# Patient Record
Sex: Male | Born: 1945
Health system: Southern US, Community
[De-identification: ages and names within clinical notes are randomized; demographics above are authoritative.]

## PROBLEM LIST (undated history)

## (undated) DIAGNOSIS — Z86010 Personal history of colonic polyps: Secondary | ICD-10-CM

## (undated) DIAGNOSIS — M545 Low back pain, unspecified: Secondary | ICD-10-CM

## (undated) DIAGNOSIS — H269 Unspecified cataract: Secondary | ICD-10-CM

## (undated) DIAGNOSIS — K219 Gastro-esophageal reflux disease without esophagitis: Secondary | ICD-10-CM

## (undated) DIAGNOSIS — I10 Essential (primary) hypertension: Secondary | ICD-10-CM

## (undated) DIAGNOSIS — R0602 Shortness of breath: Secondary | ICD-10-CM

## (undated) DIAGNOSIS — I739 Peripheral vascular disease, unspecified: Secondary | ICD-10-CM

## (undated) DIAGNOSIS — Z9081 Acquired absence of spleen: Secondary | ICD-10-CM

## (undated) DIAGNOSIS — N400 Enlarged prostate without lower urinary tract symptoms: Secondary | ICD-10-CM

## (undated) DIAGNOSIS — M47812 Spondylosis without myelopathy or radiculopathy, cervical region: Secondary | ICD-10-CM

## (undated) DIAGNOSIS — Z87891 Personal history of nicotine dependence: Secondary | ICD-10-CM

## (undated) DIAGNOSIS — K279 Peptic ulcer, site unspecified, unspecified as acute or chronic, without hemorrhage or perforation: Secondary | ICD-10-CM

## (undated) DIAGNOSIS — L309 Dermatitis, unspecified: Secondary | ICD-10-CM

## (undated) DIAGNOSIS — J45909 Unspecified asthma, uncomplicated: Secondary | ICD-10-CM

## (undated) DIAGNOSIS — J449 Chronic obstructive pulmonary disease, unspecified: Secondary | ICD-10-CM

## (undated) DIAGNOSIS — Z8619 Personal history of other infectious and parasitic diseases: Secondary | ICD-10-CM

## (undated) DIAGNOSIS — Z5189 Encounter for other specified aftercare: Secondary | ICD-10-CM

## (undated) HISTORY — DX: Essential (primary) hypertension: I10

## (undated) HISTORY — DX: Low back pain: M54.5

## (undated) HISTORY — DX: Unspecified asthma, uncomplicated: J45.909

## (undated) HISTORY — DX: Benign prostatic hyperplasia without lower urinary tract symptoms: N40.0

## (undated) HISTORY — DX: Unspecified cataract: H26.9

## (undated) HISTORY — DX: Acquired absence of spleen: Z90.81

## (undated) HISTORY — DX: Personal history of other infectious and parasitic diseases: Z86.19

## (undated) HISTORY — DX: Low back pain, unspecified: M54.50

## (undated) HISTORY — DX: Personal history of colonic polyps: Z86.010

## (undated) HISTORY — PX: KNEE ARTHROSCOPY: SHX127

## (undated) HISTORY — DX: Personal history of nicotine dependence: Z87.891

## (undated) HISTORY — DX: Chronic obstructive pulmonary disease, unspecified: J44.9

## (undated) HISTORY — DX: Peptic ulcer, site unspecified, unspecified as acute or chronic, without hemorrhage or perforation: K27.9

## (undated) HISTORY — DX: Peripheral vascular disease, unspecified: I73.9

## (undated) HISTORY — DX: Encounter for other specified aftercare: Z51.89

## (undated) HISTORY — DX: Spondylosis without myelopathy or radiculopathy, cervical region: M47.812

---

## 1996-07-04 HISTORY — PX: SPLENECTOMY, PARTIAL: SHX787

## 2007-07-05 HISTORY — PX: COLONOSCOPY: SHX174

## 2011-07-05 HISTORY — PX: OTHER SURGICAL HISTORY: SHX169

## 2012-02-24 LAB — LIPID PANEL: Triglycerides: 65

## 2012-02-24 LAB — COMPREHENSIVE METABOLIC PANEL
ALT: 12 U/L (ref 10–40)
AST: 13 U/L
Alkaline Phosphatase: 68 U/L
Creat: 1.07
Glucose: 99

## 2012-02-24 LAB — PSA: PSA: 1.15

## 2012-09-20 ENCOUNTER — Encounter: Payer: Self-pay | Admitting: Family Medicine

## 2012-09-20 ENCOUNTER — Ambulatory Visit (INDEPENDENT_AMBULATORY_CARE_PROVIDER_SITE_OTHER): Payer: Medicare Other | Admitting: Family Medicine

## 2012-09-20 VITALS — BP 170/96 | HR 77 | Temp 98.1°F | Ht 69.5 in | Wt 177.0 lb

## 2012-09-20 DIAGNOSIS — I10 Essential (primary) hypertension: Secondary | ICD-10-CM

## 2012-09-20 DIAGNOSIS — M545 Low back pain, unspecified: Secondary | ICD-10-CM | POA: Insufficient documentation

## 2012-09-20 MED ORDER — LOSARTAN POTASSIUM-HCTZ 50-12.5 MG PO TABS: 1.0000 | ORAL_TABLET | Freq: Every day | ORAL | Status: DC

## 2012-09-20 MED ORDER — ATENOLOL 50 MG PO TABS: 50.0000 mg | ORAL_TABLET | Freq: Every day | ORAL | Status: DC

## 2012-09-20 NOTE — Assessment & Plan Note (Signed)
Monitor for now.  Await records.

## 2012-09-20 NOTE — Assessment & Plan Note (Signed)
Chronic, uncontrolled 2/2 ran out of meds and difficulty establishing with new provider. Restart, slowly.  Start atenolol 50mg  daily, then start losartan hctz 50/12.5 rtc 1 mo for /fu I have requested records from prior PCP. Will likely be due for CPE 01/2013.

## 2012-09-20 NOTE — Patient Instructions (Addendum)
Fill the blood pressure medicines and take atenolol 50mg  today and tomorrow and then start atenolol 50mg  and losartan hctz 50/12.5 mg daily. Goal blood pressure <140/90 Let's keep track of bp at home and keep log 1 week prior to next appointment. Return in 1 month for follow up blood pressure. May get blood work then.

## 2012-09-20 NOTE — Progress Notes (Signed)
Subjective:    Patient ID: Gregory Abbott, male    DOB: Oct 23, 1945, 67 y.o.   MRN: [id number]  HPI CC: new pt to establish  Recently moved from [place].  Retired 04/04/2012.  Moved to be closer to wife.  Family in [place].  HTN - Has been out of BP meds since 07/2012.  Has been stretching meds out.  Prior when on meds, good control.  Was on atenolol 100mg  daily as well as losartan hctz 50/12.5 mg daily.  No HA, vision changes, CP/tightness, SOB, leg swelling.  Lower back pain - h/o bulging disc and herniated disc in lower back.  Smoker - 1/2 ppd.  Working on cutting back with blue cig (e-cig).  Preventative: No recent CPE. colonosocpy 2010  Medications and allergies reviewed and updated in chart.  Past histories reviewed and updated if relevant as below. Patient Active Problem List  Diagnosis  . GERD (gastroesophageal reflux disease)  . Hypertension  . Lower back pain   Past Medical History  Diagnosis Date  . Childhood asthma   . History of chicken pox   . Hypertension   . Lower back pain     h/o herniated and bulging discs lower back  . Smoker    Past Surgical History  Procedure Laterality Date  . Splenectomy, partial  1998    fall down stairs   History  Substance Use Topics  . Smoking status: Current Every Day Smoker -- 0.50 packs/day    Types: Cigarettes  . Smokeless tobacco: Never Used     Comment: smoking e-cigaretts trying to quit  . Alcohol Use: Yes     Comment: occ   Family History  Problem Relation Age of Onset  . Diabetes Mother   . Hypertension Mother   . Stroke Father   . Hyperlipidemia Mother    No Known Allergies No current outpatient prescriptions on file prior to visit.   No current facility-administered medications on file prior to visit.     Review of Systems  Constitutional: Negative for fever, chills, activity change, appetite change, fatigue and unexpected weight change.  HENT: Negative for hearing loss and neck pain.   Eyes: Negative  for visual disturbance.  Respiratory: Negative for cough, chest tightness, shortness of breath and wheezing.   Cardiovascular: Negative for chest pain, palpitations and leg swelling.  Gastrointestinal: Negative for nausea, vomiting, abdominal pain, diarrhea, constipation, blood in stool and abdominal distention.  Genitourinary: Negative for hematuria and difficulty urinating.  Musculoskeletal: Positive for myalgias (leg and calf pain occasionally in am's (s/p normal ABIs 12/2011)). Negative for arthralgias.  Skin: Negative for rash.  Neurological: Negative for dizziness, seizures, syncope and headaches.  Hematological: Does not bruise/bleed easily.  Psychiatric/Behavioral: Negative for dysphoric mood. The patient is not nervous/anxious.        Objective:   Physical Exam  Nursing note and vitals reviewed. Constitutional: He is oriented to person, place, and time. He appears well-developed and well-nourished. No distress.  HENT:  Head: Normocephalic and atraumatic.  Right Ear: Hearing, tympanic membrane, external ear and ear canal normal.  Left Ear: Hearing, tympanic membrane, external ear and ear canal normal.  Nose: Nose normal.  Mouth/Throat: Oropharynx is clear and moist. No oropharyngeal exudate.  Eyes: Conjunctivae and EOM are normal. Pupils are equal, round, and reactive to light. No scleral icterus.  Neck: Normal range of motion. Neck supple. No thyromegaly present.  Cardiovascular: Normal rate, regular rhythm, normal heart sounds and intact distal pulses.   No murmur heard.  Pulses:      Radial pulses are 2+ on the right side, and 2+ on the left side.  Pulmonary/Chest: Effort normal and breath sounds normal. No respiratory distress. He has no wheezes. He has no rales.  Musculoskeletal: Normal range of motion. He exhibits no edema.  Lymphadenopathy:    He has no cervical adenopathy.  Neurological: He is alert and oriented to person, place, and time.  CN grossly intact, station  and gait intact  Skin: Skin is warm and dry. No rash noted.  Psychiatric: He has a normal mood and affect. His behavior is normal. Judgment and thought content normal.       Assessment & Plan:

## 2012-10-21 ENCOUNTER — Encounter: Payer: Self-pay | Admitting: Family Medicine

## 2012-10-22 ENCOUNTER — Ambulatory Visit (INDEPENDENT_AMBULATORY_CARE_PROVIDER_SITE_OTHER): Payer: Medicare Other | Admitting: Family Medicine

## 2012-10-22 ENCOUNTER — Encounter: Payer: Self-pay | Admitting: Family Medicine

## 2012-10-22 VITALS — BP 148/92 | Temp 98.1°F | Wt 176.5 lb

## 2012-10-22 DIAGNOSIS — F17200 Nicotine dependence, unspecified, uncomplicated: Secondary | ICD-10-CM

## 2012-10-22 DIAGNOSIS — Z87891 Personal history of nicotine dependence: Secondary | ICD-10-CM | POA: Insufficient documentation

## 2012-10-22 DIAGNOSIS — I10 Essential (primary) hypertension: Secondary | ICD-10-CM

## 2012-10-22 MED ORDER — LOSARTAN POTASSIUM-HCTZ 100-12.5 MG PO TABS: 1.0000 | ORAL_TABLET | Freq: Every day | ORAL | Status: DC

## 2012-10-22 NOTE — Assessment & Plan Note (Addendum)
Continue to encourage cessation. quitlinenc resource provided. precontemplative.

## 2012-10-22 NOTE — Assessment & Plan Note (Signed)
Improved control on these meds, however still not at goal .  Will increase losartan hct to 100/12.5mg  daily. rtc for AMW.

## 2012-10-22 NOTE — Patient Instructions (Addendum)
Good to see you today, call us with questions. Increase losartan hct to 100/12.5mg  one daily. Return in 3 months for follow up. Continue working on cutting back on smoking - best thing to do for your health. Ok to continue naprosyn. Given family history of stroke, start aspirin 81mg  every other day.

## 2012-10-22 NOTE — Progress Notes (Signed)
  Subjective:    Patient ID: Graydon Fofana, male    DOB: September 12, 1945, 67 y.o.   MRN: [id number]  HPI CC: f/u HTN  Mr Lincoln returns today for f/u blood pressure.  HTN - compliant with meds.  No HA, vision changes, CP/tightness, SOB, leg swelling.  Improved #s but some still poor control.  Brings bp log over last month - range 134-171/70-102. BP Readings from Last 3 Encounters:  10/22/12 148/92  09/20/12 170/96    Occasional right wrist discomfort worse with exertion.  Occasional swelling.  Has been using wife's pain medication - naprosyn.  Smoking - 1/2 ppd.  Also using blue cig  Past Medical History  Diagnosis Date  . Childhood asthma   . History of chicken pox   . Hypertension   . Lower back pain     h/o herniated and bulging discs lower back  . Smoker   . BPH (benign prostatic hypertrophy)   . Osteoarthritis of neck      Review of Systems Per HPI    Objective:   Physical Exam  Nursing note and vitals reviewed. Constitutional: He appears well-developed and well-nourished. No distress.  HENT:  Head: Normocephalic and atraumatic.  Mouth/Throat: Oropharynx is clear and moist. No oropharyngeal exudate.  Eyes: Conjunctivae and EOM are normal. Pupils are equal, round, and reactive to light. No scleral icterus.  Neck: Normal range of motion. Neck supple. Carotid bruit is not present. No thyromegaly present.  Cardiovascular: Normal rate, regular rhythm, normal heart sounds and intact distal pulses.   No murmur heard. Pulmonary/Chest: Breath sounds normal. No respiratory distress. He has no wheezes. He has no rales.  Musculoskeletal: He exhibits no edema.  Skin: Skin is warm and dry. No rash noted.       Assessment & Plan:

## 2012-10-26 ENCOUNTER — Encounter: Payer: Self-pay | Admitting: Family Medicine

## 2013-01-19 ENCOUNTER — Encounter (HOSPITAL_COMMUNITY): Payer: Self-pay | Admitting: *Deleted

## 2013-01-19 ENCOUNTER — Emergency Department (INDEPENDENT_AMBULATORY_CARE_PROVIDER_SITE_OTHER)
Admission: EM | Admit: 2013-01-19 | Discharge: 2013-01-19 | Disposition: A | Discharge status: Home / Self Care | Payer: Medicare Other | Source: Home / Self Care

## 2013-01-19 DIAGNOSIS — M25469 Effusion, unspecified knee: Secondary | ICD-10-CM

## 2013-01-19 DIAGNOSIS — M25462 Effusion, left knee: Secondary | ICD-10-CM

## 2013-01-19 DIAGNOSIS — M1990 Unspecified osteoarthritis, unspecified site: Secondary | ICD-10-CM | POA: Diagnosis not present

## 2013-01-19 NOTE — ED Notes (Signed)
Pt  Woke   Up  Several  Days  Ago       With  Swollen  l  Knee  Had  Some    Pain  Earlier  But  None  Now     He  denys  Any  specefic  Injury     No  History  Of  Any  Related knee  Problems

## 2013-01-19 NOTE — ED Provider Notes (Signed)
Medical screening examination/treatment/procedure(s) were performed by non-physician practitioner and as supervising physician I was immediately available for consultation/collaboration.  Leslee Home, M.D.  Reuben Likes, MD 01/19/13 [phone]

## 2013-01-19 NOTE — ED Provider Notes (Signed)
History    CSN: [id number] Arrival date & time 01/19/13  1355  First MD Initiated Contact with Patient 01/19/13 1412     Chief Complaint  Patient presents with  . Knee Pain   (Consider location/radiation/quality/duration/timing/severity/associated sxs/prior Treatment) HPI Comments: 67 year old male presents with swelling of the left knee for 2 days. The initial pain began in the shin and calf however that has since abated and he is now complaining of swelling and stiffness to the left knee. He denies any pain in the leg today he has limited flexion due to swelling. He does not have a history of gout and he denies trauma or working on his knees.  Past Medical History  Diagnosis Date  . Childhood asthma   . History of chicken pox   . Hypertension   . Lower back pain     h/o herniated and bulging discs lower back  . Smoker   . BPH (benign prostatic hypertrophy)   . Osteoarthritis of neck    Past Surgical History  Procedure Laterality Date  . Splenectomy, partial  1998    fall down stairs  . Colonoscopy  2009  . Lower extremity arterial doppler  2013    WNL   Family History  Problem Relation Age of Onset  . Diabetes Mother   . Hypertension Mother   . Stroke Father   . Hyperlipidemia Mother    History  Substance Use Topics  . Smoking status: Current Every Day Smoker -- 0.50 packs/day    Types: Cigarettes  . Smokeless tobacco: Never Used     Comment: smoking e-cigaretts trying to quit  . Alcohol Use: Yes     Comment: occ    Review of Systems  Constitutional: Negative.   Respiratory: Negative.   Gastrointestinal: Negative.   Genitourinary: Negative.   Musculoskeletal:       As per HPI  Skin: Negative.   Neurological: Negative for dizziness, weakness, numbness and headaches.    Allergies  Review of patient's allergies indicates no known allergies.  Home Medications   Current Outpatient Rx  Name  Route  Sig  Dispense  Refill  . aspirin EC 81 MG tablet  Oral   Take 81 mg by mouth every other day.         [name] atenolol (TENORMIN) 50 MG tablet   Oral   Take 1 tablet (50 mg total) by mouth daily.   90 tablet   3   . losartan-hydrochlorothiazide (HYZAAR) 100-12.5 MG per tablet   Oral   Take 1 tablet by mouth daily.   90 tablet   3    BP 140/67  Pulse 78  Temp(Src) 98 F (36.7 C) (Oral)  Resp 16  SpO2 99% Physical Exam  Nursing note and vitals reviewed. Constitutional: He is oriented to person, place, and time. He appears well-developed and well-nourished.  HENT:  Head: Normocephalic and atraumatic.  Eyes: EOM are normal. Left eye exhibits no discharge.  Neck: Normal range of motion. Neck supple.  Pulmonary/Chest: Effort normal.  Musculoskeletal: He exhibits edema. He exhibits no tenderness.  Left knee with apparent swelling/effusion. No tenderness. No bony tenderness or soft tissue tenderness. Extension to 180 complete. Flexion is limited to 100. No increase in warmth, no redness and no lymphangitis.  Neurological: He is alert and oriented to person, place, and time. No cranial nerve deficit.  Skin: Skin is warm and dry.  Psychiatric: He has a normal mood and affect.    ED Course  Procedures (including critical care time) Labs Reviewed - No data to display No results found. 1. Osteoarthritis   2. Knee effusion, left     MDM  I offered to tap the left knee but the patient declined. He state he would rather wear a supportive device, keep leg elevated apply ice. Complied with that request and applied a firmly wrapped in Ace bandage with maintaining distal circulation. He was advised to return if there was increased swelling, redness or worsening.   Hayden Rasmussen, NP 01/19/13 1524

## 2013-01-25 ENCOUNTER — Other Ambulatory Visit: Payer: Self-pay | Admitting: Family Medicine

## 2013-01-25 ENCOUNTER — Other Ambulatory Visit (INDEPENDENT_AMBULATORY_CARE_PROVIDER_SITE_OTHER): Payer: BC Managed Care – PPO

## 2013-01-25 DIAGNOSIS — Z125 Encounter for screening for malignant neoplasm of prostate: Secondary | ICD-10-CM

## 2013-01-25 DIAGNOSIS — I10 Essential (primary) hypertension: Secondary | ICD-10-CM

## 2013-01-25 LAB — BASIC METABOLIC PANEL
CO2: 25 mEq/L (ref 19–32)
Calcium: 9.7 mg/dL (ref 8.4–10.5)
Glucose, Bld: 99 mg/dL (ref 70–99)
Sodium: 139 mEq/L (ref 135–145)

## 2013-01-25 LAB — LIPID PANEL
HDL: 46.5 mg/dL (ref >39.00)
Total CHOL/HDL Ratio: 4

## 2013-02-01 ENCOUNTER — Ambulatory Visit (INDEPENDENT_AMBULATORY_CARE_PROVIDER_SITE_OTHER): Payer: BC Managed Care – PPO | Admitting: Family Medicine

## 2013-02-01 ENCOUNTER — Telehealth: Payer: Self-pay | Admitting: *Deleted

## 2013-02-01 ENCOUNTER — Ambulatory Visit: Payer: Medicare Other | Admitting: Family Medicine

## 2013-02-01 ENCOUNTER — Encounter: Payer: Self-pay | Admitting: Family Medicine

## 2013-02-01 VITALS — BP 170/94 | HR 88 | Temp 98.3°F | Ht 69.5 in | Wt 169.5 lb

## 2013-02-01 DIAGNOSIS — F17200 Nicotine dependence, unspecified, uncomplicated: Secondary | ICD-10-CM

## 2013-02-01 DIAGNOSIS — I10 Essential (primary) hypertension: Secondary | ICD-10-CM

## 2013-02-01 DIAGNOSIS — Z0000 Encounter for general adult medical examination without abnormal findings: Secondary | ICD-10-CM | POA: Insufficient documentation

## 2013-02-01 DIAGNOSIS — Z136 Encounter for screening for cardiovascular disorders: Secondary | ICD-10-CM

## 2013-02-01 DIAGNOSIS — J209 Acute bronchitis, unspecified: Secondary | ICD-10-CM | POA: Diagnosis not present

## 2013-02-01 DIAGNOSIS — Z1211 Encounter for screening for malignant neoplasm of colon: Secondary | ICD-10-CM

## 2013-02-01 MED ORDER — AZITHROMYCIN 250 MG PO TABS: ORAL_TABLET | ORAL | Status: DC

## 2013-02-01 MED ORDER — ATENOLOL 50 MG PO TABS: 50.0000 mg | ORAL_TABLET | Freq: Two times a day (BID) | ORAL | Status: DC

## 2013-02-01 NOTE — Assessment & Plan Note (Signed)
Continued to strongly encourage cessation.

## 2013-02-01 NOTE — Patient Instructions (Signed)
Continue losartan hctz 100/12.5mg  once daily for blood pressure. Start taking atenolol 50mg  twice daily for blood pressure.  Continue aspirin 81mg  every other day. Pass by Marion's office to schedule screening ultrasound for abdominal aneurysm (in [place]) EKG today. Schedule eye exam as you're due and you failed vision screen today. Let me know if you notice worsening trouble with hearing. Stool kit today. Advanced directive packet provided today.  We will continue to discuss. For bronchitis - take zpack antibiotic.  If not improved or any worsening, return to see me. Best thing for your health is to quit smoking Good to see you today, call us with questions.

## 2013-02-01 NOTE — Progress Notes (Signed)
Subjective:    Patient ID: Gregory Abbott, male    DOB: [birth year], 67 y.o.   MRN: [id number]  HPI CC: welcome to medicare visit.  HTN - some confusion about meds today, but endorses taking meds as prescribed.  bp at home ranging 130-170 systolic.  Did take meds today prior to appointment. Recently saw [facility] with knee OA with effusion.  This has improved. Fighting bout of bronchitis - wheezing at night.  No fevers.  Smoker.  Going on for last month.  Cough productive of sputum. Foot rash - comes and goes.  Itchy.  Blisters develop.  Has received what sounds like steroid shot to treat this in the past.  Smoker - 1/2 ppd.  Has smoked ~47 yrs.  Almost 30 PY hx.    Failed hearing and vision screens today.  Last eye exam 2012.  Denies trouble with hearing.  Declines audiology referral No falls, no depression.  Preventative:  colonoscopy in [place], but no records of actual colonoscopy, states all normal.  No fmhx colon cancer. Prostate cancer screening - discussed, would like screening today. Tdap - 2012 Pneumovax - pt states had done 1990s, due for rpt.  Deferred as currently with resp illness. Shingle shot - have not discussed. Advanced directives - discussed. packet provided.   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  Medications and allergies reviewed and updated in chart.  Past histories reviewed and updated if relevant as below. Patient Active Problem List   Diagnosis Date Noted  . Smoker 10/22/2012  . Hypertension   . Lower back pain    Past Medical History  Diagnosis Date  . Childhood asthma   . History of chicken pox   . Hypertension   .  Lower back pain     h/o herniated and bulging discs lower back  . Smoker   . BPH (benign prostatic hypertrophy)   . Osteoarthritis of neck    Past Surgical History  Procedure Laterality Date  . Splenectomy, partial  1998    fall down stairs  . Colonoscopy  2009  . Lower extremity arterial doppler  2013    WNL   History  Substance Use Topics  . Smoking status: Current Every Day Smoker -- 0.50 packs/day    Types: Cigarettes  . Smokeless tobacco: Never Used     Comment: smoking e-cigaretts trying to quit  . Alcohol Use: Yes     Comment: Social   Family History  Problem Relation Age of Onset  . Diabetes Mother   . Hypertension Mother   . Stroke Father   . Hyperlipidemia Mother    No Known Allergies Current Outpatient Prescriptions on File Prior to Visit  Medication Sig Dispense Refill  . aspirin EC 81 MG tablet Take 81 mg by mouth every other day.      [name] atenolol (TENORMIN) 50 MG tablet Take 1 tablet (50 mg total) by mouth daily.  90 tablet  3  . losartan-hydrochlorothiazide (HYZAAR) 100-12.5 MG per tablet Take 1 tablet by mouth daily.  90 tablet  3   No current facility-administered medications on file prior to visit.     Review  of Systems  Constitutional: Negative for fever, chills, activity change, appetite change, fatigue and unexpected weight change.  HENT: Negative for hearing loss and neck pain.   Eyes: Negative for visual disturbance.  Respiratory: Negative for cough, chest tightness, shortness of breath and wheezing.   Cardiovascular: Negative for chest pain, palpitations and leg swelling.  Gastrointestinal: Negative for nausea, vomiting, abdominal pain, diarrhea, constipation, blood in stool and abdominal distention.  Genitourinary: Negative for hematuria and difficulty urinating.  Musculoskeletal: Positive for myalgias (leg and calf pain occasionally in am's (s/p normal ABIs 12/2011)). Negative for arthralgias.  Skin: Negative for rash.  Neurological: Negative  for dizziness, seizures, syncope and headaches.  Hematological: Does not bruise/bleed easily.  Psychiatric/Behavioral: Negative for dysphoric mood. The patient is not nervous/anxious.        Objective:   Physical Exam  Nursing note and vitals reviewed. Constitutional: He is oriented to person, place, and time. He appears well-developed and well-nourished. No distress.  HENT:  Head: Normocephalic and atraumatic.  Right Ear: Hearing, tympanic membrane, external ear and ear canal normal.  Left Ear: Hearing, tympanic membrane, external ear and ear canal normal.  Nose: Nose normal. No mucosal edema or rhinorrhea.  Mouth/Throat: Uvula is midline, oropharynx is clear and moist and mucous membranes are normal. No oropharyngeal exudate, posterior oropharyngeal edema, posterior oropharyngeal erythema or tonsillar abscesses.  Eyes: Conjunctivae and EOM are normal. Pupils are equal, round, and reactive to light. No scleral icterus.  Neck: Normal range of motion. Neck supple. No thyromegaly present.  Cardiovascular: Normal rate, regular rhythm, normal heart sounds and intact distal pulses.   No murmur heard. Pulses:      Radial pulses are 2+ on the right side, and 2+ on the left side.  Pulmonary/Chest: Effort normal. No respiratory distress. He has no wheezes. He has rhonchi (L sided). He has no rales.  Coarse throughout  Abdominal: Soft. Bowel sounds are normal. He exhibits no distension and no mass. There is no tenderness. There is no rebound and no guarding.  Genitourinary: Prostate normal. Rectal exam shows external hemorrhoid (noniflamed anterior). Rectal exam shows no internal hemorrhoid, no fissure, no mass, no tenderness and anal tone normal. Prostate is not enlarged (~20gm) and not tender.  Musculoskeletal: Normal range of motion.  Lymphadenopathy:    He has no cervical adenopathy.  Neurological: He is alert and oriented to person, place, and time.  CN grossly intact, station and gait  intact  Skin: Skin is warm and dry. No rash noted.  Psychiatric: He has a normal mood and affect. His behavior is normal. Judgment and thought content normal.       Assessment & Plan:

## 2013-02-01 NOTE — Telephone Encounter (Signed)
Patient's wife called and wasn't sure if patient mentioned a rash behind his ear and down his neck at his appt today. She said he took azithromycin and now rash has spread down his arm. She wanted you to see this note and wanted him to be seen again so, he is coming back in at 2.

## 2013-02-01 NOTE — Assessment & Plan Note (Signed)
I have personally reviewed the Medicare Annual Wellness questionnaire and have noted 1. The patient's medical and social history 2. Their use of alcohol, tobacco or illicit drugs 3. Their current medications and supplements 4. The patient's functional ability including ADL's, fall risks, home safety risks and hearing or visual impairment. 5. Diet and physical activity 6. Evidence for depression or mood disorders The patients weight, height, BMI have been recorded in the chart.  Hearing and vision has been addressed. I have made referrals, counseling and provided education to the patient based review of the above and I have provided the pt with a written personalized care plan for preventive services. See scanned questionairre. Advanced directives discussed: pt has discussed with wife in past.  Provided with handout.  Reviewed preventative protocols and updated unless pt declined.  Colonoscopy 2009 - but unsure results.  Will start with stool kit today. Will discuss pneumovax and zostavax in future. Refer for screening abd [place] for AAA in smoker. EKG today - NSR rate 75-80, normal axis, intervals, no acute ST/T changes, LAE with p mitrale

## 2013-02-01 NOTE — Telephone Encounter (Signed)
pt cancelled f/u appointment. I doubt rash related to zpack (started 5 min after med) No tongue/lip/throat swelling or dyspnea.   Pt will see me in Monday, knows to stop med if new sxs develop.

## 2013-02-01 NOTE — Assessment & Plan Note (Signed)
In smoker with asplenia - treat with azithromycin.  If not improved or any worsening, pt aware to return immediately for further evaluation.

## 2013-02-01 NOTE — Assessment & Plan Note (Signed)
Some confusion about meds. Will increase atenolol to 50mg  bid.  Continue hyzaar 100/12.5mg  daily.

## 2013-02-04 ENCOUNTER — Ambulatory Visit (INDEPENDENT_AMBULATORY_CARE_PROVIDER_SITE_OTHER): Payer: Medicare Other | Admitting: Family Medicine

## 2013-02-04 ENCOUNTER — Encounter: Payer: Self-pay | Admitting: Family Medicine

## 2013-02-04 VITALS — BP 126/78 | HR 88 | Temp 97.6°F | Wt 171.0 lb

## 2013-02-04 DIAGNOSIS — R21 Rash and other nonspecific skin eruption: Secondary | ICD-10-CM | POA: Insufficient documentation

## 2013-02-04 DIAGNOSIS — J209 Acute bronchitis, unspecified: Secondary | ICD-10-CM

## 2013-02-04 DIAGNOSIS — I10 Essential (primary) hypertension: Secondary | ICD-10-CM

## 2013-02-04 MED ORDER — CEFTRIAXONE SODIUM 1 G IJ SOLR
1.0000 g | Freq: Once | INTRAMUSCULAR | Status: AC
  Administered 2013-02-04: 1 g via INTRAMUSCULAR

## 2013-02-04 NOTE — Assessment & Plan Note (Signed)
Much better controlled today.

## 2013-02-04 NOTE — Addendum Note (Signed)
Addended by: Josph Macho A on: 02/04/2013 10:03 AM   Modules accepted: Orders

## 2013-02-04 NOTE — Assessment & Plan Note (Signed)
Anticipate drug rash to azithromycin. Monitor for improvement off this med. Update me if new lesions developing off zpack, or any worsening or other concerns. Pt agrees with plan.

## 2013-02-04 NOTE — Patient Instructions (Signed)
Try to schedule appointment with eye doctor.  If trouble scheduling appointment (for routine eye exam), let me know I think this rash is due to azithromycin.  i've updated your chart. Should get better with time. Shot of rocephin today for cough. Good to see you today, call us with questions.

## 2013-02-04 NOTE — Assessment & Plan Note (Signed)
Overall improving cough, however in h/o asplenia, will provide with rocephin 1gm IM today to cover typical bacterial pathogens. Return if persistent sxs.

## 2013-02-04 NOTE — Progress Notes (Signed)
  Subjective:    Patient ID: Gregory Abbott, male    DOB: 03/03/1946, 67 y.o.   MRN: [id number]  HPI CC: check rash   bp much better controlled today.  Seen here on Friday 02/01/2013 for medicare wellness visit - treated with zpack for 1 mo h/o bronchitis in smoker with asplenia.  After first pill, started breaking out in rash that started in forearms and spread throughout body.  Took 3 days total of azithromycin.  Rash described as red splotches throughout body - chest, trunk, knees up, arms.  Not itchy, some tender.  No new lotions, detergents, shampoos or soaps.  Cough is some better - just some cough in am, no longer wheezing at night.  Foot rash - comes and goes. Itchy. Blisters develop. Has received what sounds like steroid shot to treat this in the past.  Currently absent.  Rash on scalp cleared with cortisone cream.  Pt has part D - states medicare will cover shingles shot at pharmacy.  1/2 ppd smoker for 40 years (~20 PY hx).  Pt reports last CXR done about 1 year ago.  Past Medical History  Diagnosis Date  . Childhood asthma   . History of chicken pox   . Hypertension   . Lower back pain     h/o herniated and bulging discs lower back  . Smoker   . BPH (benign prostatic hypertrophy)   . Osteoarthritis of neck      Review of Systems Per HPI    Objective:   Physical Exam  Nursing note and vitals reviewed. Constitutional: He appears well-developed and well-nourished. No distress.  HENT:  Head: Normocephalic and atraumatic.  Mouth/Throat: Oropharynx is clear and moist. No oropharyngeal exudate.  Cardiovascular: Normal rate, regular rhythm, normal heart sounds and intact distal pulses.   No murmur heard. Pulmonary/Chest: Effort normal. No respiratory distress. He has no wheezes. He has rhonchi (RLL). He has no rales.  Skin: Skin is warm and dry. Rash noted. There is erythema.  Erythematous macules throughout trunk, some on arms and thighs.  Non pruritic.  Some tender.        Assessment & Plan:

## 2013-02-05 ENCOUNTER — Other Ambulatory Visit: Payer: Self-pay | Admitting: Family Medicine

## 2013-02-05 ENCOUNTER — Encounter: Payer: Self-pay | Admitting: Internal Medicine

## 2013-02-05 ENCOUNTER — Other Ambulatory Visit: Payer: BC Managed Care – PPO

## 2013-02-05 DIAGNOSIS — R195 Other fecal abnormalities: Secondary | ICD-10-CM

## 2013-02-05 DIAGNOSIS — Z1211 Encounter for screening for malignant neoplasm of colon: Secondary | ICD-10-CM

## 2013-02-05 LAB — FECAL OCCULT BLOOD, IMMUNOCHEMICAL: Fecal Occult Bld: POSITIVE

## 2013-02-09 ENCOUNTER — Emergency Department (HOSPITAL_COMMUNITY): Payer: Medicare Other

## 2013-02-09 ENCOUNTER — Emergency Department (HOSPITAL_COMMUNITY)
Admission: EM | Admit: 2013-02-09 | Discharge: 2013-02-09 | Disposition: A | Discharge status: Home / Self Care | Payer: Medicare Other | Attending: Emergency Medicine | Admitting: Emergency Medicine

## 2013-02-09 ENCOUNTER — Encounter (HOSPITAL_COMMUNITY): Payer: Self-pay | Admitting: *Deleted

## 2013-02-09 DIAGNOSIS — Z7982 Long term (current) use of aspirin: Secondary | ICD-10-CM | POA: Insufficient documentation

## 2013-02-09 DIAGNOSIS — Z880 Allergy status to penicillin: Secondary | ICD-10-CM | POA: Insufficient documentation

## 2013-02-09 DIAGNOSIS — I10 Essential (primary) hypertension: Secondary | ICD-10-CM | POA: Insufficient documentation

## 2013-02-09 DIAGNOSIS — F17200 Nicotine dependence, unspecified, uncomplicated: Secondary | ICD-10-CM | POA: Diagnosis not present

## 2013-02-09 DIAGNOSIS — N3289 Other specified disorders of bladder: Secondary | ICD-10-CM | POA: Diagnosis not present

## 2013-02-09 DIAGNOSIS — Z87448 Personal history of other diseases of urinary system: Secondary | ICD-10-CM | POA: Diagnosis not present

## 2013-02-09 DIAGNOSIS — R109 Unspecified abdominal pain: Secondary | ICD-10-CM | POA: Diagnosis not present

## 2013-02-09 DIAGNOSIS — M47812 Spondylosis without myelopathy or radiculopathy, cervical region: Secondary | ICD-10-CM | POA: Diagnosis not present

## 2013-02-09 DIAGNOSIS — R1032 Left lower quadrant pain: Secondary | ICD-10-CM | POA: Insufficient documentation

## 2013-02-09 DIAGNOSIS — Z8619 Personal history of other infectious and parasitic diseases: Secondary | ICD-10-CM | POA: Insufficient documentation

## 2013-02-09 DIAGNOSIS — Z79899 Other long term (current) drug therapy: Secondary | ICD-10-CM | POA: Diagnosis not present

## 2013-02-09 DIAGNOSIS — J45909 Unspecified asthma, uncomplicated: Secondary | ICD-10-CM | POA: Insufficient documentation

## 2013-02-09 LAB — URINALYSIS, ROUTINE W REFLEX MICROSCOPIC
Ketones, ur: NEGATIVE mg/dL
Leukocytes, UA: NEGATIVE
Nitrite: NEGATIVE
Protein, ur: NEGATIVE mg/dL
pH: 6 (ref 5.0–8.0)

## 2013-02-09 LAB — COMPREHENSIVE METABOLIC PANEL
ALT: 14 U/L (ref 0–53)
AST: 16 U/L (ref 0–37)
Albumin: 2.5 g/dL — ABNORMAL LOW (ref 3.5–5.2)
Calcium: 8.9 mg/dL (ref 8.4–10.5)
Creatinine, Ser: 0.72 mg/dL (ref 0.50–1.35)
Sodium: 136 mEq/L (ref 135–145)

## 2013-02-09 LAB — CBC WITH DIFFERENTIAL/PLATELET
Basophils Absolute: 0.1 10*3/uL (ref 0.0–0.1)
Eosinophils Relative: 24 % — ABNORMAL HIGH (ref 0–5)
Lymphs Abs: 1.5 10*3/uL (ref 0.7–4.0)
MCV: 91.6 fL (ref 78.0–100.0)
Monocytes Relative: 11 % (ref 3–12)
Neutrophils Relative %: 45 % (ref 43–77)
Platelets: 469 10*3/uL — ABNORMAL HIGH (ref 150–400)
RBC: 4.54 MIL/uL (ref 4.22–5.81)
RDW: 13.4 % (ref 11.5–15.5)
WBC: 7.9 10*3/uL (ref 4.0–10.5)

## 2013-02-09 MED ORDER — IOHEXOL 300 MG/ML  SOLN
25.0000 mL | INTRAMUSCULAR | Status: DC | PRN
  Administered 2013-02-09: 50 mL via ORAL

## 2013-02-09 MED ORDER — HYDROCODONE-ACETAMINOPHEN 5-325 MG PO TABS
2.0000 | ORAL_TABLET | Freq: Once | ORAL | Status: AC
  Administered 2013-02-09: 2 via ORAL
  Filled 2013-02-09: qty 2

## 2013-02-09 MED ORDER — IOHEXOL 300 MG/ML  SOLN
100.0000 mL | Freq: Once | INTRAMUSCULAR | Status: AC | PRN
Start: 2013-02-09 — End: 2013-02-09
  Administered 2013-02-09: 100 mL via INTRAVENOUS

## 2013-02-09 MED ORDER — POLYETHYLENE GLYCOL 3350 17 GM/SCOOP PO POWD: 17.0000 g | Freq: Every day | ORAL | Status: DC

## 2013-02-09 MED ORDER — IPRATROPIUM BROMIDE 0.02 % IN SOLN
0.5000 mg | Freq: Once | RESPIRATORY_TRACT | Status: AC
  Administered 2013-02-09: 0.5 mg via RESPIRATORY_TRACT
  Filled 2013-02-09: qty 2.5

## 2013-02-09 MED ORDER — ALBUTEROL SULFATE (5 MG/ML) 0.5% IN NEBU
5.0000 mg | INHALATION_SOLUTION | Freq: Once | RESPIRATORY_TRACT | Status: AC
  Administered 2013-02-09: 5 mg via RESPIRATORY_TRACT
  Filled 2013-02-09: qty 1

## 2013-02-09 MED ORDER — HYDROCODONE-ACETAMINOPHEN 5-325 MG PO TABS: 1.0000 | ORAL_TABLET | Freq: Four times a day (QID) | ORAL | Status: DC | PRN

## 2013-02-09 NOTE — ED Notes (Signed)
The pt has had abd pain for one week.  His pain started after the was started on an antibiotic

## 2013-02-09 NOTE — ED Provider Notes (Signed)
CSN: [id number]     Arrival date & time 02/09/13  0243 History     First MD Initiated Contact with Patient 02/09/13 0310     Chief Complaint  Patient presents with  . Abdominal Pain   HPI Gregory Abbott is a 67 y.o. male who presents with abdominal pain left upper quadrant left lower quadrant and there for one week, and the constant, no associated nausea or vomiting. He says this pain started after he was started on antibiotics. He is currently getting worked up for occult bleeding rectally. No fever, no chills, chest pain, shortness of breath.   Past Medical History  Diagnosis Date  . Childhood asthma   . History of chicken pox   . Hypertension   . Lower back pain     h/o herniated and bulging discs lower back  . Smoker   . BPH (benign prostatic hypertrophy)   . Osteoarthritis of neck    Past Surgical History  Procedure Laterality Date  . Splenectomy, partial  1998    fall down stairs  . Colonoscopy  2009  . Lower extremity arterial doppler  2013    WNL   Family History  Problem Relation Age of Onset  . Diabetes Mother   . Hypertension Mother   . Stroke Father   . Hyperlipidemia Mother    History  Substance Use Topics  . Smoking status: Current Every Day Smoker -- 0.50 packs/day    Types: Cigarettes  . Smokeless tobacco: Never Used     Comment: smoking e-cigaretts trying to quit  . Alcohol Use: Yes     Comment: Social    Review of Systems At least 10pt or greater review of systems completed and are negative except where specified in the HPI.  Allergies  Penicillins and Azithromycin  Home Medications   Current Outpatient Rx  Name  Route  Sig  Dispense  Refill  . aspirin EC 81 MG tablet   Oral   Take 81 mg by mouth every other day.         [name] atenolol (TENORMIN) 50 MG tablet   Oral   Take 1 tablet (50 mg total) by mouth 2 (two) times daily.   180 tablet   3   . ibuprofen (ADVIL,MOTRIN) 200 MG tablet   Oral   Take 600 mg by mouth every 6 (six) hours  as needed for pain.         [name] losartan-hydrochlorothiazide (HYZAAR) 100-12.5 MG per tablet   Oral   Take 1 tablet by mouth daily.   90 tablet   3    BP 169/73  Pulse 78  Temp(Src) 98.7 F (37.1 C) (Oral)  Resp 18  SpO2 98% Physical Exam  Nursing notes reviewed.  Electronic medical record reviewed. VITAL SIGNS:   Filed Vitals:   02/09/13 0250  BP: 169/73  Pulse: 78  Temp: 98.7 F (37.1 C)  TempSrc: Oral  Resp: 18  SpO2: 98%   CONSTITUTIONAL: Awake, oriented, appears non-toxic HENT: Atraumatic, normocephalic, oral mucosa pink and moist, airway patent. Nares patent without drainage. External ears normal. EYES: Conjunctiva clear, EOMI, PERRLA NECK: Trachea midline, non-tender, supple CARDIOVASCULAR: Normal heart rate, Normal rhythm, No murmurs, rubs, gallops PULMONARY/CHEST: Clear to auscultation, no rhonchi, wheezes, or rales. Symmetrical breath sounds. Non-tender. ABDOMINAL: Non-distended, soft, mild tenderness to palpation left lower quadrant without rebound or guarding.  BS normal. NEUROLOGIC: Non-focal, moving all four extremities, no gross sensory or motor deficits. EXTREMITIES: No clubbing, cyanosis, or edema  SKIN: Warm, Dry, No erythema, No rash  ED Course   Procedures (including critical care time)  Labs Reviewed  CBC WITH DIFFERENTIAL - Abnormal; Notable for the following:    Platelets 469 (*)    Eosinophils Relative 24 (*)    Eosinophils Absolute 1.9 (*)    All other components within normal limits  COMPREHENSIVE METABOLIC PANEL - Abnormal; Notable for the following:    Potassium 3.4 (*)    Glucose, Bld 127 (*)    Albumin 2.5 (*)    Total Bilirubin 0.2 (*)    All other components within normal limits  URINALYSIS, ROUTINE W REFLEX MICROSCOPIC  LIPASE, BLOOD   Dg Abd 2 Views  02/09/2013   *RADIOLOGY REPORT*  Clinical Data: Abdominal pain. Pain for 1 month, radiating to left flank.  ABDOMEN - 2 VIEW  Comparison: None.  Findings:  No abnormal  intra-abdominal mass effect.  There is a 3 mm calcification overlapping the right lower quadrant, nonspecific and possibly representing appendicolith, soft tissue calcification, or peritoneal body. 2 mm nonspecific lower left pelvic calcification.  Nonobstructive bowel gas pattern.  Lung bases are clear.  Advanced right hip osteoarthritis. Symphysis pubis degenerative changes  IMPRESSION: Nonobstructive bowel gas pattern.   Original Report Authenticated By: Tiburcio Pea   1. Abdominal pain, acute, left lower quadrant     MDM  Gregory Abbott is a 67 y.o. male presenting as mostly left lower quadrant pain. Patient is had some recent blood in his stools on occult blood testing. Patient does have followup colonoscopy scheduled. With the patient's pain and recent blood in the stool concern for obstruction, concern for cancerous mass. X-rays unremarkable, labs are unremarkable. CT of the abdomen and pelvis shows nothing acute. Patient felt better after medication. UA is unremarkable, no evidence for biliary obstruction, white count is normal, patient does have an elevated number of eosinophils, he relates being on antibiotics recently which has nothing to agree with him. He's also been constipated recently. Occasional bowel regimen in addition to some pain medicine, also told to avoid aspirin, Aleve and ibuprofen which she's been taking some daily.   Jones Skene, MD 02/09/13 [phone]

## 2013-02-13 ENCOUNTER — Telehealth: Payer: Self-pay

## 2013-02-13 ENCOUNTER — Encounter: Payer: Self-pay | Admitting: Family Medicine

## 2013-02-13 ENCOUNTER — Ambulatory Visit (INDEPENDENT_AMBULATORY_CARE_PROVIDER_SITE_OTHER): Payer: BC Managed Care – PPO | Admitting: Family Medicine

## 2013-02-13 VITALS — BP 146/78 | HR 73 | Temp 98.1°F | Ht 69.5 in | Wt 165.8 lb

## 2013-02-13 DIAGNOSIS — G8929 Other chronic pain: Secondary | ICD-10-CM

## 2013-02-13 DIAGNOSIS — R109 Unspecified abdominal pain: Secondary | ICD-10-CM

## 2013-02-13 DIAGNOSIS — R1013 Epigastric pain: Secondary | ICD-10-CM | POA: Diagnosis not present

## 2013-02-13 LAB — POCT URINALYSIS DIPSTICK: Glucose, UA: NEGATIVE

## 2013-02-13 MED ORDER — OMEPRAZOLE 20 MG PO CPDR: 20.0000 mg | DELAYED_RELEASE_CAPSULE | Freq: Every day | ORAL | Status: DC

## 2013-02-13 NOTE — Telephone Encounter (Signed)
First of Aug pt noticed on and off abd pain lt lower quadrant; pt seen Gregory Abbott 02/09/13 was given laxative and pt said no blockage; all test normal at ED; pt having normal BM now, no constipation or diarrhea and no fever. Pt still has LLQ pain, dull to sharp on and off but burning pain all the time and occasional pain goes into pts back(pain level 5-6). Pt not able to sleep at night due to pain. Dr Reece Agar has no available appts; pt to see Dr Milinda Antis today at 8:45 am.

## 2013-02-13 NOTE — Progress Notes (Signed)
Subjective:    Patient ID: Gregory Abbott, male    DOB: 1946-03-08, 67 y.o.   MRN: [id number]  HPI Here for abd pain  Going on for a while Was seen in the ER with neg w/u -- lab and CT  Worse at night 5/10 on pain scale/ dull and constant  No change with eating or not eating  Appetite went down lately Tried gaviscon with an acid reducer- helped initially (took at 5 pm and his symptoms returned at 3 am)  Drinks a lot of water  [employer] for ongoing constipation   9/11 has GI appt  Has had blood occult in stools   Pain moves from one area to another  Goes from mid abdomen and radiates all the way around Mentions that he had a small ulcer in the past-he watches diet for that  He takes cimetidine   Did eat tomato sauce one day   Is trying to quit smoking    Patient Active Problem List   Diagnosis Date Noted  . Skin rash 02/04/2013  . Welcome to Medicare preventive visit 02/01/2013  . Acute bronchitis 02/01/2013  . Smoker 10/22/2012  . Hypertension   . Lower back pain    Past Medical History  Diagnosis Date  . Childhood asthma   . History of chicken pox   . Hypertension   . Lower back pain     h/o herniated and bulging discs lower back  . Smoker   . BPH (benign prostatic hypertrophy)   . Osteoarthritis of neck    Past Surgical History  Procedure Laterality Date  . Splenectomy, partial  1998    fall down stairs  . Colonoscopy  2009  . Lower extremity arterial doppler  2013    WNL   History  Substance Use Topics  . Smoking status: Current Every Day Smoker -- 0.25 packs/day    Types: Cigarettes  . Smokeless tobacco: Never Used     Comment: smoking e-cigaretts trying to quit  . Alcohol Use: Yes     Comment: Social   Family History  Problem Relation Age of Onset  . Diabetes Mother   . Hypertension Mother   . Stroke Father   . Hyperlipidemia Mother    Allergies  Allergen Reactions  . Penicillins Other (See Comments)    Unknown - as child. ?asthma   . Azithromycin Rash   Current Outpatient Prescriptions on File Prior to Visit  Medication Sig Dispense Refill  . aspirin EC 81 MG tablet Take 81 mg by mouth every other day.      . losartan-hydrochlorothiazide (HYZAAR) 100-12.5 MG per tablet Take 1 tablet by mouth daily.  90 tablet  3  . polyethylene glycol powder (GLYCOLAX) powder Take 17 g by mouth daily.  255 g  0  . HYDROcodone-acetaminophen (NORCO/VICODIN) 5-325 MG per tablet Take 1-2 tablets by mouth every 6 (six) hours as needed for pain.  17 tablet  0   No current facility-administered medications on file prior to visit.    Review of Systems Review of Systems  Constitutional: Negative for fever, appetite change, fatigue and unexpected weight change.  Eyes: Negative for pain and visual disturbance.  Respiratory: Negative for cough and shortness of breath.   Cardiovascular: Negative for cp or palpitations    Gastrointestinal: Negative for nausea, diarrhea and constipation. pos for abdominal pain/ upper  Genitourinary: Negative for urgency and frequency.  Skin: Negative for pallor or rash   Neurological: Negative for weakness, light-headedness, numbness  and headaches.  Hematological: Negative for adenopathy. Does not bruise/bleed easily.  Psychiatric/Behavioral: Negative for dysphoric mood. The patient is not nervous/anxious.         Objective:   Physical Exam  Constitutional: He appears well-developed and well-nourished. No distress.  HENT:  Head: Normocephalic and atraumatic.  Mouth/Throat: Oropharynx is clear and moist.  Eyes: Conjunctivae and EOM are normal. Pupils are equal, round, and reactive to light. No scleral icterus.  Neck: Normal range of motion. Neck supple. No thyromegaly present.  Cardiovascular: Normal rate and regular rhythm.   Pulmonary/Chest: Effort normal and breath sounds normal. No respiratory distress. He has no wheezes. He has no rales. He exhibits no tenderness.  Diffusely distant bs   Abdominal:  Soft. Bowel sounds are normal. He exhibits no distension and no mass. There is no hepatosplenomegaly. There is tenderness in the epigastric area. There is no rigidity, no rebound, no guarding, no CVA tenderness, no tenderness at McBurney's point and negative Murphy's sign.  Baseline splenectomy scar  Musculoskeletal: He exhibits no edema.  Lymphadenopathy:    He has no cervical adenopathy.  Neurological: He is alert.  Skin: Skin is warm and dry. No rash noted. No pallor.  Psychiatric: He has a normal mood and affect.          Assessment & Plan:

## 2013-02-13 NOTE — Telephone Encounter (Signed)
Pt was seen

## 2013-02-13 NOTE — Patient Instructions (Addendum)
Continue the gaviscon if it is helping  Start omeprazole now - take first dose today when you get it , then take one pill each am  If not improving in several days or if worse please let us know  Stick to a bland diet Good luck quitting smoking

## 2013-02-14 ENCOUNTER — Encounter (HOSPITAL_COMMUNITY): Payer: Self-pay | Admitting: *Deleted

## 2013-02-14 ENCOUNTER — Emergency Department (HOSPITAL_COMMUNITY)
Admission: EM | Admit: 2013-02-14 | Discharge: 2013-02-14 | Disposition: A | Discharge status: Home / Self Care | Payer: Medicare Other | Attending: Emergency Medicine | Admitting: Emergency Medicine

## 2013-02-14 DIAGNOSIS — Z8739 Personal history of other diseases of the musculoskeletal system and connective tissue: Secondary | ICD-10-CM | POA: Diagnosis not present

## 2013-02-14 DIAGNOSIS — I10 Essential (primary) hypertension: Secondary | ICD-10-CM | POA: Insufficient documentation

## 2013-02-14 DIAGNOSIS — M479 Spondylosis, unspecified: Secondary | ICD-10-CM | POA: Diagnosis not present

## 2013-02-14 DIAGNOSIS — Z7982 Long term (current) use of aspirin: Secondary | ICD-10-CM | POA: Insufficient documentation

## 2013-02-14 DIAGNOSIS — F17200 Nicotine dependence, unspecified, uncomplicated: Secondary | ICD-10-CM | POA: Diagnosis not present

## 2013-02-14 DIAGNOSIS — Z79899 Other long term (current) drug therapy: Secondary | ICD-10-CM | POA: Diagnosis not present

## 2013-02-14 DIAGNOSIS — J45909 Unspecified asthma, uncomplicated: Secondary | ICD-10-CM | POA: Insufficient documentation

## 2013-02-14 DIAGNOSIS — R1012 Left upper quadrant pain: Secondary | ICD-10-CM | POA: Diagnosis not present

## 2013-02-14 DIAGNOSIS — Z8619 Personal history of other infectious and parasitic diseases: Secondary | ICD-10-CM | POA: Diagnosis not present

## 2013-02-14 DIAGNOSIS — Z9089 Acquired absence of other organs: Secondary | ICD-10-CM | POA: Insufficient documentation

## 2013-02-14 DIAGNOSIS — R109 Unspecified abdominal pain: Secondary | ICD-10-CM | POA: Insufficient documentation

## 2013-02-14 DIAGNOSIS — Z880 Allergy status to penicillin: Secondary | ICD-10-CM | POA: Insufficient documentation

## 2013-02-14 LAB — URINALYSIS, ROUTINE W REFLEX MICROSCOPIC
Ketones, ur: NEGATIVE mg/dL
Leukocytes, UA: NEGATIVE
Nitrite: NEGATIVE
Specific Gravity, Urine: 1.015 (ref 1.005–1.030)
pH: 8 (ref 5.0–8.0)

## 2013-02-14 LAB — CBC WITH DIFFERENTIAL/PLATELET
Basophils Relative: 1 % (ref 0–1)
Hemoglobin: 16.5 g/dL (ref 13.0–17.0)
MCHC: 35.2 g/dL (ref 30.0–36.0)
Monocytes Relative: 13 % — ABNORMAL HIGH (ref 3–12)
Neutro Abs: 3.9 10*3/uL (ref 1.7–7.7)
Neutrophils Relative %: 46 % (ref 43–77)
RBC: 5.07 MIL/uL (ref 4.22–5.81)
WBC: 8.5 10*3/uL (ref 4.0–10.5)

## 2013-02-14 LAB — COMPREHENSIVE METABOLIC PANEL
AST: 13 U/L (ref 0–37)
Albumin: 3 g/dL — ABNORMAL LOW (ref 3.5–5.2)
Alkaline Phosphatase: 67 U/L (ref 39–117)
BUN: 11 mg/dL (ref 6–23)
Chloride: 99 mEq/L (ref 96–112)
Potassium: 4.8 mEq/L (ref 3.5–5.1)
Total Bilirubin: 0.4 mg/dL (ref 0.3–1.2)

## 2013-02-14 LAB — LIPASE, BLOOD: Lipase: 14 U/L (ref 11–59)

## 2013-02-14 MED ORDER — DICYCLOMINE HCL 20 MG PO TABS: 20.0000 mg | ORAL_TABLET | Freq: Two times a day (BID) | ORAL | Status: DC

## 2013-02-14 MED ORDER — MORPHINE SULFATE 4 MG/ML IJ SOLN
4.0000 mg | Freq: Once | INTRAMUSCULAR | Status: AC
  Administered 2013-02-14: 4 mg via INTRAVENOUS
  Filled 2013-02-14: qty 1

## 2013-02-14 MED ORDER — GI COCKTAIL ~~LOC~~
30.0000 mL | Freq: Once | ORAL | Status: AC
  Administered 2013-02-14: 30 mL via ORAL
  Filled 2013-02-14: qty 30

## 2013-02-14 MED ORDER — HYDROCODONE-ACETAMINOPHEN 5-325 MG PO TABS: 1.0000 | ORAL_TABLET | ORAL | Status: DC | PRN

## 2013-02-14 NOTE — Assessment & Plan Note (Signed)
Now pain is epigastric Per px -has hx of PUD in distant past and his cimetidine/gaviscon helped somewhat last night  Px omeprazole 20 to try Update if not starting to improve in several days  or if worsening   Has GI appt upcoming

## 2013-02-14 NOTE — ED Notes (Signed)
Reports pain started as abd pain two weeks ago and has now been in left side/flank for past two days. Having mild diarrhea, denies any vomiting or urinary symptoms.

## 2013-02-14 NOTE — ED Provider Notes (Signed)
CSN: [id number]     Arrival date & time 02/14/13  9147 History     First MD Initiated Contact with Patient 02/14/13 [phone]     Chief Complaint  Patient presents with  . Flank Pain  . 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 Patient reports that he's been having abdominal pain for at 2 weeks located in different areas of his abdomen and also flank pain that had been on the right however for the last 2 days he's had left-sided flank pain. Describes it as a burning pain that comes and goes rated about a 4/10. He saw his primary care physician for it yesterday who suggested it is possible ulcer disease. Patient was also noted to have occult blood in his stool and is scheduled for a colonoscopy in September. Family is concerned regarding potential peptic ulcer disease secondary to H. pylori, and also concern regarding his continuing pain over the last 2 weeks. Patient's notes his bowel movements are "irregular" and reports that he was on antibiotics for bronchitis previously had small amounts of diarrhea, and however denies any diarrhea today. Denies any visible blood in his stool. Denies any nausea, vomiting, dysuria, urinary frequency. Patient was seen in the emergency department 02/09/2013 and at that time had a CT scan with contrast of his abdomen and pelvis which was within normal limits.  Past Medical History  Diagnosis Date  . Childhood asthma   . History of chicken pox   . Hypertension   . Lower back pain     h/o herniated and bulging discs lower back  . Smoker   . BPH (benign prostatic hypertrophy)   . Osteoarthritis of neck    Past Surgical History  Procedure Laterality Date  . Splenectomy, partial  1998   fall down stairs  . Colonoscopy  2009  . Lower extremity arterial doppler  2013    WNL   Family History  Problem Relation Age of Onset  . Diabetes Mother   . Hypertension Mother   . Stroke Father   . Hyperlipidemia Mother    History  Substance Use Topics  . Smoking status: Current Every Day Smoker -- 0.25 packs/day    Types: Cigarettes  . Smokeless tobacco: Never Used     Comment: smoking e-cigaretts trying to quit  . Alcohol Use: Yes     Comment: Social    Review of Systems  Constitutional: Negative for fever.  HENT: Negative for sore throat and neck stiffness.   Eyes: Negative for visual disturbance.  Respiratory: Negative for shortness of breath.   Cardiovascular: Negative for chest pain.  Gastrointestinal: Positive for abdominal pain (however reports pain is mostly left flank now). Negative for nausea, vomiting, diarrhea, constipation and blood in stool (occult, no gross blood).  Genitourinary: Positive for flank pain. Negative for difficulty urinating.  Musculoskeletal: Negative for back pain.  Skin: Negative for rash.  Neurological: Negative for syncope and headaches.    Allergies  Penicillins and Azithromycin  Home Medications   Current Outpatient Rx  Name  Route  Sig  Dispense  Refill  . Alum Hydroxide-Mag Carbonate (GAVISCON PO)   Oral   Take 30 mL by mouth every 4 (four) hours as needed (for upset stomach or heartburn).          [name] aspirin  EC 81 MG tablet   Oral   Take 81 mg by mouth every other day.         [name] atenolol (TENORMIN) 50 MG tablet   Oral   Take 100 mg by mouth.         . Cimetidine (ACID REDUCER PO)   Oral   Take 1 tablet by mouth daily.         [name] HYDROcodone-acetaminophen (NORCO/VICODIN) 5-325 MG per tablet   Oral   Take 1-2 tablets by mouth every 6 (six) hours as needed for pain.   17 tablet   0   . losartan-hydrochlorothiazide (HYZAAR) 100-12.5 MG per tablet   Oral   Take 1 tablet by mouth daily.   90 tablet   3   .  omeprazole (PRILOSEC) 20 MG capsule   Oral   Take 1 capsule (20 mg total) by mouth daily.   30 capsule   3   . polyethylene glycol powder (GLYCOLAX) powder   Oral   Take 17 g by mouth daily.   255 g   0   . dicyclomine (BENTYL) 20 MG tablet   Oral   Take 1 tablet (20 mg total) by mouth 2 (two) times daily.   20 tablet   0   . HYDROcodone-acetaminophen (NORCO/VICODIN) 5-325 MG per tablet   Oral   Take 1-2 tablets by mouth every 4 (four) hours as needed for pain (Do not exceed 3000mg  acetaminophen in 24hr).   10 tablet   0    BP 143/81  Pulse 63  Temp(Src) 97.6 F (36.4 C) (Oral)  Resp 18  SpO2 97% Physical Exam  Nursing note and vitals reviewed. Constitutional: He is oriented to person, place, and time. He appears well-developed and well-nourished. No distress.  HENT:  Head: Normocephalic and atraumatic.  Eyes: Conjunctivae and EOM are normal.  Neck: Normal range of motion.  Cardiovascular: Normal rate, regular rhythm, normal heart sounds and intact distal pulses.  Exam reveals no gallop and no friction rub.   No murmur heard. Pulmonary/Chest: Effort normal and breath sounds normal. No respiratory distress. He has no wheezes. He has no rales.  Abdominal: Soft. He exhibits no distension. There is tenderness (mild LUQ). There is CVA tenderness (mild). There is no guarding.  Musculoskeletal: He exhibits no edema and no tenderness.  Neurological: He is alert and oriented to person, place, and time.  Skin: Skin is warm and dry. He is not diaphoretic.    ED Course   Procedures (including critical care time)  Labs Reviewed  CBC WITH DIFFERENTIAL - Abnormal; Notable for the following:    Platelets 465 (*)    Monocytes Relative 13 (*)    Monocytes Absolute 1.1 (*)    Eosinophils Relative 22 (*)    Eosinophils Absolute 1.9 (*)    All other components within normal limits  COMPREHENSIVE METABOLIC PANEL - Abnormal; Notable for the following:    Glucose, Bld 101 (*)     Albumin 3.0 (*)    GFR calc non Af Amer 72 (*)    GFR calc Af Amer 84 (*)    All other components within normal limits  URINALYSIS, ROUTINE W REFLEX MICROSCOPIC  LIPASE, BLOOD  H. PYLORI ANTIBODY, IGG   No results found. 1. 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  Patient was in the emergency department on 02/09/2013 and had a CT scan of the abdomen and pelvis with contrast which was without abnormalities. Given chronicity of pain cited by patient have low suspicion that he has developed any new abnormalities that would be seen on CT scan today that were not present 2 weeks ago.  Abdominal exam benign. Have low suspicion for nephrolithiasis given patient's description of pain, and have low suspicion for diverticulitis as patient does not exhibit LLQ tenderness on exam.  Low suspicion for mesenteric ischemia or AAA. CBC shows a normal hemoglobin. CMP within normal limits or normal transaminases, and creatinine near where it has been in past evaluation. Urinalysis shows no signs of infection and have low suspicion for pyelonephritis. H. pylori antibody ordered at wife's request, and discussed nonspecific results of the positive test as well as that test will not return for one to 2 days. Patient was recommended to continue followup with his primary care physician regarding his pain, and told he may take 2 tablets at a time of his Norco to help him sleep at night. Discussed risks of narcotics as well as the Tylenol component of the medication and upper limits.  Pain may represent PUD and patient scheduled for colonoscopy/endoscopy per family in September.  Patient discharged in stable condition with understanding of reasons to return.    Rhae Lerner, MD 02/14/13 [phone]

## 2013-02-18 NOTE — ED Provider Notes (Signed)
I saw and evaluated the patient, reviewed the resident's note and I agree with the findings and plan. Patient flank pain. Recent CT without clear cause. Has GI followup. Will discharge  Juliet Rude. Rubin Payor, MD 02/18/13 [phone]

## 2013-02-21 ENCOUNTER — Telehealth: Payer: Self-pay | Admitting: Family Medicine

## 2013-02-21 NOTE — Telephone Encounter (Signed)
Can we call for an update on how his abdominal pain is?  If persistent pain, may recommend trial off losartan - if he decides to do this, will need to add another BP med - let me know if desires trial of this. BP Readings from Last 3 Encounters:  02/14/13 143/81  02/13/13 146/78  02/09/13 161/80

## 2013-02-22 NOTE — Telephone Encounter (Signed)
LMTCB

## 2013-02-25 NOTE — Telephone Encounter (Signed)
Spoke to patient and was advised that his pain has improved about 97% and only has pain occasionally. Patient states that he is waiting for the results of the blood test regarding a peptic ulcer.  Patient was told that Dr. Sharen Hones would get the results and give them to him.

## 2013-02-25 NOTE — Telephone Encounter (Signed)
Left message on voice mail  to call back

## 2013-02-26 NOTE — Telephone Encounter (Signed)
Left message on machine for patient to call back.

## 2013-02-26 NOTE — Telephone Encounter (Signed)
Patient notified as instructed by telephone. 

## 2013-02-26 NOTE — Telephone Encounter (Signed)
plz notify H pylori testing returned inconclusive. Would continue omeprazole 20mg  daily along with cimetidine for now as sxs better and update [place] if sxs persist or return. May recheck level next blood draw.

## 2013-03-14 ENCOUNTER — Ambulatory Visit (INDEPENDENT_AMBULATORY_CARE_PROVIDER_SITE_OTHER): Payer: BC Managed Care – PPO | Admitting: Internal Medicine

## 2013-03-14 ENCOUNTER — Encounter: Payer: Self-pay | Admitting: Internal Medicine

## 2013-03-14 VITALS — BP 154/80 | HR 76 | Ht 69.5 in | Wt 162.4 lb

## 2013-03-14 DIAGNOSIS — R195 Other fecal abnormalities: Secondary | ICD-10-CM | POA: Diagnosis not present

## 2013-03-14 MED ORDER — NA SULFATE-K SULFATE-MG SULF 17.5-3.13-1.6 GM/177ML PO SOLN: 1.0000 | Freq: Once | ORAL | Status: DC

## 2013-03-14 NOTE — Patient Instructions (Addendum)
You have been given a separate informational sheet regarding your tobacco use, the importance of quitting and local resources to help you quit. You have been scheduled for a colonoscopy with propofol. Please follow written instructions given to you at your visit today.  Please pick up your prep kit at the pharmacy within the next 1-3 days. If you use inhalers (even only as needed), please bring them with you on the day of your procedure. Your physician has requested that you go to www.[email or web address].com and enter the access code given to you at your visit today. This web site gives a general overview about your procedure. However, you should still follow specific instructions given to you by our office regarding your preparation for the procedure. CC:  Gregory Boyden MD

## 2013-03-14 NOTE — Progress Notes (Signed)
Subjective:    Patient ID: Gregory Abbott, male    DOB: April 01, 1946, 67 y.o.   MRN: [id number]  HPI The patient is a very pleasant married man here with his wife, referred by Dr. Sharen Hones due to iFOBT + stool. He was not feeling well with bronchitis/URI this summer and was treated with azithromycin. He developed sweeling, abdominal pain, loose stools and skin rash/ulcers in an allergic reaction. Eventually these all resolved but he was tested and was iFOBT +. No visible bleeding lately but has had some intermittent bright red blood per rectum. He has had a colonoscopy in [place] before moving here - reports it was negative but recalls being told to repeat in 5 years. That was probably 2008. 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  Allergies  Allergen Reactions  . Penicillins Other (See Comments)    Unknown - as child. ?asthma  . Azithromycin Rash   Outpatient Prescriptions Prior to Visit  Medication Sig Dispense Refill  . aspirin EC 81 MG tablet Take 81 mg by mouth every other day.      [name] atenolol (TENORMIN) 50 MG tablet Take 100 mg by mouth.      . losartan-hydrochlorothiazide (HYZAAR) 100-12.5 MG per tablet Take 1 tablet by mouth daily.  90 tablet  3  . Alum Hydroxide-Mag Carbonate (GAVISCON PO) Take 30 mL by mouth every 4 (four) hours as needed (for upset stomach or heartburn).       . Cimetidine (ACID REDUCER PO) Take 1 tablet by mouth daily.      [name] dicyclomine (BENTYL) 20 MG tablet Take 1 tablet (20 mg total) by mouth 2 (two) times daily.  20 tablet  0  . HYDROcodone-acetaminophen (NORCO/VICODIN) 5-325 MG per tablet Take 1-2 tablets by mouth every 6 (six) hours as needed for pain.  17 tablet  0  . HYDROcodone-acetaminophen (NORCO/VICODIN) 5-325 MG per tablet Take 1-2 tablets by mouth every 4 (four) hours as needed for pain (Do not exceed 3000mg  acetaminophen in 24hr).  10 tablet  0  . omeprazole (PRILOSEC) 20 MG capsule Take 1 capsule (20 mg total) by  mouth daily.  30 capsule  3  . polyethylene glycol powder (GLYCOLAX) powder Take 17 g by mouth daily.  255 g  0   No facility-administered medications prior to visit.   Past Medical History  Diagnosis Date  . Childhood asthma   . History of chicken pox   . Hypertension   . Lower back pain     h/o herniated and bulging discs lower back  . Smoker   . BPH (benign prostatic hypertrophy)   . Osteoarthritis of neck   . PUD (peptic ulcer disease)    Past Surgical History  Procedure Laterality Date  . Splenectomy, partial  1998    fall down stairs  . Colonoscopy  2009  . Lower extremity arterial doppler  2013    WNL  . Knee arthroscopy     History   Social History  . Marital Status: Married    Spouse Name: N/A    Number of Children: 2  . Years of Education: N/A   Occupational History  . Retired    Social History Main Topics  . Smoking status: Current Every Day Smoker -- 0.25 packs/day    Types: Cigarettes  . Smokeless tobacco: Never Used     Comment: smoking e-cigaretts trying to quit  . Alcohol Use: Yes     Comment: Social  .  Drug Use: No    Social History Narrative   Lives with wife, no pets   Occupation: retired, Education officer, environmental   Edu: Degree   Activity: housework, no regular exercise   Diet: good water, fruits/vegetables daily   Family History  Problem Relation Age of Onset  . Diabetes Mother   . Hypertension Mother   . Stroke Father   . Hyperlipidemia Mother   . Heart disease Mother    Review of Systems + for those things in HPI, some OA and back pain All other ROS negative    Objective:   Physical Exam General:  Well-developed, well-nourished and in no acute distress Eyes:  anicteric. ENT:   Mouth and posterior pharynx free of lesions.  Neck:   supple w/o thyromegaly or mass.  Lungs: Clear to auscultation bilaterally. Heart:  S1S2, no rubs, murmurs, gallops. Abdomen:  soft, non-tender, no hepatosplenomegaly, hernia, or mass and BS+.   Rectal: deferred Lymph:  no cervical or supraclavicular adenopathy. Extremities:   no edema Skin   no rash. Neuro:  A&O x 3.  Psych:  appropriate mood and  Affect.   Data Reviewed: Lab Results  Component Value Date   WBC 8.5 02/14/2013   HGB 16.5 02/14/2013   HCT 46.9 02/14/2013   MCV 92.5 02/14/2013   PLT 465* 02/14/2013   iFOBT PCP notes Aug 2014       Assessment & Plan:  Heme positive stool-iFOBT  1. Evaluate for cause with colonoscopy. The risks and benefits as well as alternatives of endoscopic procedure(s) have been discussed and reviewed. All questions answered. The patient agrees to proceed.

## 2013-04-03 HISTORY — PX: OTHER SURGICAL HISTORY: SHX169

## 2013-04-10 ENCOUNTER — Encounter (INDEPENDENT_AMBULATORY_CARE_PROVIDER_SITE_OTHER): Payer: Medicare Other

## 2013-04-10 DIAGNOSIS — I700 Atherosclerosis of aorta: Secondary | ICD-10-CM | POA: Diagnosis not present

## 2013-04-10 DIAGNOSIS — I10 Essential (primary) hypertension: Secondary | ICD-10-CM | POA: Diagnosis not present

## 2013-04-10 DIAGNOSIS — Z139 Encounter for screening, unspecified: Secondary | ICD-10-CM

## 2013-04-10 DIAGNOSIS — F17200 Nicotine dependence, unspecified, uncomplicated: Secondary | ICD-10-CM

## 2013-04-11 ENCOUNTER — Encounter: Payer: Self-pay | Admitting: *Deleted

## 2013-04-11 ENCOUNTER — Encounter: Payer: Self-pay | Admitting: Family Medicine

## 2013-04-17 DIAGNOSIS — H524 Presbyopia: Secondary | ICD-10-CM | POA: Diagnosis not present

## 2013-04-17 DIAGNOSIS — H18419 Arcus senilis, unspecified eye: Secondary | ICD-10-CM | POA: Diagnosis not present

## 2013-04-17 DIAGNOSIS — H2510 Age-related nuclear cataract, unspecified eye: Secondary | ICD-10-CM | POA: Diagnosis not present

## 2013-04-17 DIAGNOSIS — H25019 Cortical age-related cataract, unspecified eye: Secondary | ICD-10-CM | POA: Diagnosis not present

## 2013-04-26 DIAGNOSIS — H2510 Age-related nuclear cataract, unspecified eye: Secondary | ICD-10-CM | POA: Diagnosis not present

## 2013-05-03 DIAGNOSIS — Z23 Encounter for immunization: Secondary | ICD-10-CM | POA: Diagnosis not present

## 2013-05-04 HISTORY — PX: COLONOSCOPY: SHX174

## 2013-05-04 HISTORY — PX: CATARACT EXTRACTION: SUR2

## 2013-05-06 ENCOUNTER — Telehealth: Payer: Self-pay | Admitting: Internal Medicine

## 2013-05-06 NOTE — Telephone Encounter (Signed)
Returned pts phone call.  He stated that he ate eggs, toast and bacon this morning.  I advised him that he should not eat any additional solid food.  He should strictly stick to the clear liquid diet drinking plenty of fluids.  He verbalized understanding.   He will call back with any further questions.

## 2013-05-07 ENCOUNTER — Encounter: Payer: Self-pay | Admitting: Internal Medicine

## 2013-05-07 ENCOUNTER — Ambulatory Visit (AMBULATORY_SURGERY_CENTER): Payer: Medicare Other | Admitting: Internal Medicine

## 2013-05-07 VITALS — BP 148/104 | HR 64 | Temp 97.0°F | Resp 22 | Ht 69.5 in | Wt 162.0 lb

## 2013-05-07 DIAGNOSIS — K644 Residual hemorrhoidal skin tags: Secondary | ICD-10-CM | POA: Insufficient documentation

## 2013-05-07 DIAGNOSIS — Z860100 Personal history of colon polyps, unspecified: Secondary | ICD-10-CM

## 2013-05-07 DIAGNOSIS — K648 Other hemorrhoids: Secondary | ICD-10-CM | POA: Diagnosis not present

## 2013-05-07 DIAGNOSIS — R195 Other fecal abnormalities: Secondary | ICD-10-CM

## 2013-05-07 DIAGNOSIS — D126 Benign neoplasm of colon, unspecified: Secondary | ICD-10-CM

## 2013-05-07 DIAGNOSIS — J45909 Unspecified asthma, uncomplicated: Secondary | ICD-10-CM | POA: Diagnosis not present

## 2013-05-07 DIAGNOSIS — I10 Essential (primary) hypertension: Secondary | ICD-10-CM | POA: Diagnosis not present

## 2013-05-07 DIAGNOSIS — K625 Hemorrhage of anus and rectum: Secondary | ICD-10-CM | POA: Diagnosis not present

## 2013-05-07 HISTORY — DX: Personal history of colonic polyps: Z86.010

## 2013-05-07 HISTORY — DX: Personal history of colon polyps, unspecified: Z86.0100

## 2013-05-07 MED ORDER — SODIUM CHLORIDE 0.9 % IV SOLN: 500.0000 mL | INTRAVENOUS | Status: DC

## 2013-05-07 NOTE — Op Note (Signed)
[facility] [address] [place], [address]   COLONOSCOPY PROCEDURE REPORT  PATIENT: Gregory Abbott, Gregory Abbott  MR#: [id number] BIRTHDATE: February 23, 1946 , 66  yrs. old GENDER: Male ENDOSCOPIST: Iva Boop, MD, [facility] REFERRED XB:JYNWGN Sharen Hones, M.D. PROCEDURE DATE:  05/07/2013 PROCEDURE:   Colonoscopy with snare polypectomy First Screening Colonoscopy - Avg.  risk and is 50 yrs.  old or older - No.  Prior Negative Screening - Now for repeat screening. Other: See Comments  History of Adenoma - Now for follow-up colonoscopy & has been > or = to 3 yrs.  N/A  Polyps Removed Today? Yes. ASA CLASS:   Class II INDICATIONS:heme-positive stool. MEDICATIONS: propofol (Diprivan) 200mg  IV, MAC sedation, administered by CRNA, and These medications were titrated to patient response per physician's verbal order  DESCRIPTION OF PROCEDURE:   After the risks benefits and alternatives of the procedure were thoroughly explained, informed consent was obtained.  A digital rectal exam revealed no abnormalities of the rectum, A digital rectal exam revealed the prostate was not enlarged, and A digital rectal exam revealed no prostatic nodules.   The LB [id number]  endoscope was introduced through the anus and advanced to the cecum, which was identified by both the appendix and ileocecal valve. No adverse events experienced.   The quality of the prep was excellent using Suprep  The instrument was then slowly withdrawn as the colon was fully examined.      COLON FINDINGS: A diminutive sessile polyp was found in the transverse colon.  A polypectomy was performed with a cold snare. The resection was complete and the polyp tissue was completely retrieved.   The colon mucosa was otherwise normal.  Retroflexed views revealed internal/external hemorrhoids. The time to cecum=4 minutes 03 seconds.  Withdrawal time=8 minutes 56 seconds.  The scope was withdrawn and the procedure  completed. COMPLICATIONS: There were no complications.  ENDOSCOPIC IMPRESSION: 1.   Diminutive sessile polyp was found in the transverse colon; polypectomy was performed with a cold snare 2.   The colon mucosa was otherwise normal - excellent prep 3.   Internal hemorrhoids 4.   External hemorrhoids  RECOMMENDATIONS: 1.  Timing of repeat colonoscopy will be determined by pathology findings. 2.   Consider hemorrhoid banding   eSigned:  Iva Boop, MD, [facility] 05/07/2013 2:01 PM   cc: Eustaquio Boyden MD and The Patient

## 2013-05-07 NOTE — Progress Notes (Signed)
Called to room to assist during endoscopic procedure.  Patient ID and intended procedure confirmed with present staff. Received instructions for my participation in the procedure from the performing physician.  

## 2013-05-07 NOTE — Progress Notes (Signed)
Patient did not experience any of the following events: a burn prior to discharge; a fall within the facility; wrong site/side/patient/procedure/implant event; or a hospital transfer or hospital admission upon discharge from the facility. (G8907) Patient did not have preoperative order for IV antibiotic SSI prophylaxis. (G8918)  

## 2013-05-07 NOTE — Patient Instructions (Addendum)
I found and removed one tiny polyp that looks benign.  You have hemorrhoids that are causing the blood in the stool and rectal bleeding.  These can be fixed by placing rubber bands on them (painless) during some visits to the office. Please read the handout and I can schedule an appointment for you if desired.  I will let you know pathology results and when to have another routine colonoscopy by mail.  It is the time of year to have a vaccination to prevent the flu (influenza virus). Please have this done through your primary care provider or you can get this done at local pharmacies or the [facility]. It would be very helpful if you notify your primary care provider when and where you had the vaccination given by messaging them in My Chart, leaving a message or faxing the information.  I appreciate the opportunity to care for you. Iva Boop, MD, FACG  YOU HAD AN ENDOSCOPIC PROCEDURE TODAY AT THE [facility]: Refer to the procedure report that was given to you for any specific questions about what was found during the examination.  If the procedure report does not answer your questions, please call your gastroenterologist to clarify.  If you requested that your care partner not be given the details of your procedure findings, then the procedure report has been included in a sealed envelope for you to review at your convenience later.  YOU SHOULD EXPECT: Some feelings of bloating in the abdomen. Passage of more gas than usual.  Walking can help get rid of the air that was put into your GI tract during the procedure and reduce the bloating. If you had a lower endoscopy (such as a colonoscopy or flexible sigmoidoscopy) you may notice spotting of blood in your stool or on the toilet paper. If you underwent a bowel prep for your procedure, then you may not have a normal bowel movement for a few days.  DIET: Your first meal following the procedure should be a light meal and then it  is ok to progress to your normal diet.  A half-sandwich or bowl of soup is an example of a good first meal.  Heavy or fried foods are harder to digest and may make you feel nauseous or bloated.  Likewise meals heavy in dairy and vegetables can cause extra gas to form and this can also increase the bloating.  Drink plenty of fluids but you should avoid alcoholic beverages for 24 hours.  ACTIVITY: Your care partner should take you home directly after the procedure.  You should plan to take it easy, moving slowly for the rest of the day.  You can resume normal activity the day after the procedure however you should NOT DRIVE or use heavy machinery for 24 hours (because of the sedation medicines used during the test).    SYMPTOMS TO REPORT IMMEDIATELY: A gastroenterologist can be reached at any hour.  During normal business hours, 8:30 AM to 5:00 PM Monday through Friday, call [phone].  After hours and on weekends, please call the GI answering service at [phone] who will take a message and have the physician on call contact you.   Following lower endoscopy (colonoscopy or flexible sigmoidoscopy):  Excessive amounts of blood in the stool  Significant tenderness or worsening of abdominal pains  Swelling of the abdomen that is new, acute  Fever of 100F or higher FOLLOW UP: If any biopsies were taken you will be contacted by phone or  by letter within the next 1-3 weeks.  Call your gastroenterologist if you have not heard about the biopsies in 3 weeks.  Our staff will call the home number listed on your records the next business day following your procedure to check on you and address any questions or concerns that you may have at that time regarding the information given to you following your procedure. This is a courtesy call and so if there is no answer at the home number and we have not heard from you through the emergency physician on call, we will assume that you have returned to your  regular daily activities without incident.  SIGNATURES/CONFIDENTIALITY: You and/or your care partner have signed paperwork which will be entered into your electronic medical record.  These signatures attest to the fact that that the information above on your After Visit Summary has been reviewed and is understood.  Full responsibility of the confidentiality of this discharge information lies with you and/or your care-partner.  Please read over handouts about polyps, hemorrhoids, and hemorrhoidal banding  Await pathology  Continue your normal medications

## 2013-05-08 ENCOUNTER — Telehealth: Payer: Self-pay

## 2013-05-08 NOTE — Telephone Encounter (Signed)
Left message on answering machine. 

## 2013-05-10 NOTE — Progress Notes (Signed)
Per Donia Guiles with [facility], there was no tissue in the specimen bottle.  Dr Leone Payor notified.

## 2013-05-10 NOTE — Addendum Note (Signed)
Addended by: Darrel Hoover on: 05/10/2013 03:40 PM   Modules accepted: Orders

## 2013-05-13 ENCOUNTER — Encounter: Payer: Self-pay | Admitting: Internal Medicine

## 2013-05-24 ENCOUNTER — Encounter (HOSPITAL_COMMUNITY): Payer: Self-pay | Admitting: Emergency Medicine

## 2013-05-24 ENCOUNTER — Emergency Department (HOSPITAL_COMMUNITY)
Admission: EM | Admit: 2013-05-24 | Discharge: 2013-05-24 | Disposition: A | Discharge status: Home / Self Care | Payer: BC Managed Care – PPO | Source: Home / Self Care

## 2013-05-24 ENCOUNTER — Emergency Department (INDEPENDENT_AMBULATORY_CARE_PROVIDER_SITE_OTHER): Payer: BC Managed Care – PPO

## 2013-05-24 DIAGNOSIS — M5116 Intervertebral disc disorders with radiculopathy, lumbar region: Secondary | ICD-10-CM

## 2013-05-24 DIAGNOSIS — M47817 Spondylosis without myelopathy or radiculopathy, lumbosacral region: Secondary | ICD-10-CM | POA: Diagnosis not present

## 2013-05-24 DIAGNOSIS — M5126 Other intervertebral disc displacement, lumbar region: Secondary | ICD-10-CM | POA: Diagnosis not present

## 2013-05-24 DIAGNOSIS — M5137 Other intervertebral disc degeneration, lumbosacral region: Secondary | ICD-10-CM | POA: Diagnosis not present

## 2013-05-24 MED ORDER — METHYLPREDNISOLONE 4 MG PO KIT: PACK | ORAL | Status: DC

## 2013-05-24 MED ORDER — METHYLPREDNISOLONE ACETATE 40 MG/ML IJ SUSP
80.0000 mg | Freq: Once | INTRAMUSCULAR | Status: AC
  Administered 2013-05-24: 80 mg via INTRAMUSCULAR

## 2013-05-24 MED ORDER — KETOROLAC TROMETHAMINE 30 MG/ML IJ SOLN
INTRAMUSCULAR | Status: AC
  Filled 2013-05-24: qty 1

## 2013-05-24 MED ORDER — METHYLPREDNISOLONE ACETATE 80 MG/ML IJ SUSP
INTRAMUSCULAR | Status: AC
  Filled 2013-05-24: qty 1

## 2013-05-24 MED ORDER — CYCLOBENZAPRINE HCL 5 MG PO TABS: 5.0000 mg | ORAL_TABLET | Freq: Three times a day (TID) | ORAL | Status: DC | PRN

## 2013-05-24 MED ORDER — KETOROLAC TROMETHAMINE 30 MG/ML IJ SOLN
30.0000 mg | Freq: Once | INTRAMUSCULAR | Status: AC
  Administered 2013-05-24: 30 mg via INTRAMUSCULAR

## 2013-05-24 NOTE — ED Notes (Signed)
C/o right leg pain x 3days.   Pain radiates from lower back down through out right leg.  Throbbing/shooting pain.  Cramping in right calf.   Pt has tried otc pain meds and creams with no relief.

## 2013-05-24 NOTE — ED Provider Notes (Signed)
CSN: [id number]     Arrival date & time 05/24/13  1015 History   None    Chief Complaint  Patient presents with  . Leg Pain   (Consider location/radiation/quality/duration/timing/severity/associated sxs/prior Treatment) Patient is a 67 y.o. male presenting with leg pain. The history is provided by the patient and the spouse.  Leg Pain Location:  Buttock Injury: no   Buttock location:  R buttock Pain details:    Quality:  Aching, burning and tingling   Radiates to:  R leg   Severity:  Moderate   Onset quality:  Gradual   Duration:  3 days Chronicity:  Recurrent Dislocation: no   Associated symptoms: back pain and numbness   Associated symptoms: no fever   Risk factors comment:  H/o ddd ls spine, eval in [place].   Past Medical History  Diagnosis Date  . Childhood asthma   . History of chicken pox   . Hypertension   . Lower back pain     h/o herniated and bulging discs lower back  . Smoker   . BPH (benign prostatic hypertrophy)   . Osteoarthritis of neck   . PUD (peptic ulcer disease)   . Blood transfusion without reported diagnosis   . Cataract   . Personal history of colonic polyp removed not retrieved 05/07/2013   Past Surgical History  Procedure Laterality Date  . Splenectomy, partial  1998    fall down stairs  . Colonoscopy  2009  . Lower extremity arterial doppler  2013    WNL  . Knee arthroscopy    . Aaa screen  04/2013    neg for AAA  . Colonoscopy  05/2013    1 polyp, int/ext hemorrhoids Leone Payor(   Family History  Problem Relation Age of Onset  . Diabetes Mother   . Hypertension Mother   . Hyperlipidemia Mother   . Heart disease Mother   . Stroke Father   . Colon cancer Neg Hx   . Rectal cancer Neg Hx   . Stomach cancer Neg Hx   . Esophageal cancer Neg Hx    History  Substance Use Topics  . Smoking status: Current Every Day Smoker -- 0.25 packs/day    Types: Cigarettes  . Smokeless tobacco: Never Used     Comment: smoking e-cigaretts trying  to quit  . Alcohol Use: 5.4 oz/week    6 Cans of beer, 3 Shots of liquor per week     Comment: Social    Review of Systems  Constitutional: Negative for fever.  Gastrointestinal: Negative.   Musculoskeletal: Positive for back pain and myalgias. Negative for gait problem.  Skin: Negative.     Allergies  Penicillins and Azithromycin  Home Medications   Current Outpatient Rx  Name  Route  Sig  Dispense  Refill  . aspirin EC 81 MG tablet   Oral   Take 81 mg by mouth every other day.         [name] atenolol (TENORMIN) 50 MG tablet   Oral   Take 100 mg by mouth.         . losartan-hydrochlorothiazide (HYZAAR) 100-12.5 MG per tablet   Oral   Take 1 tablet by mouth daily.   90 tablet   3   . cyclobenzaprine (FLEXERIL) 5 MG tablet   Oral   Take 1 tablet (5 mg total) by mouth 3 (three) times daily as needed for muscle spasms.   30 tablet   0   . methylPREDNISolone (MEDROL DOSEPAK)  4 MG tablet      follow package directions, take all of medicine , start on sat.   21 tablet   0    There were no vitals taken for this visit. Physical Exam  Nursing note and vitals reviewed. Constitutional: He is oriented to person, place, and time. He appears well-developed and well-nourished.  Abdominal: Soft. Bowel sounds are normal. There is no tenderness.  Musculoskeletal: He exhibits tenderness.       Lumbar back: He exhibits tenderness and bony tenderness.       Back:  Neurological: He is alert and oriented to person, place, and time.  Skin: Skin is warm and dry.    ED Course  Procedures (including critical care time) Labs Review Labs Reviewed - No data to display Imaging Review Dg Lumbar Spine Complete  05/24/2013   CLINICAL DATA:  Low back pain extending down the right leg.  EXAM: LUMBAR SPINE - COMPLETE 4+ VIEW  COMPARISON:  None.  FINDINGS: There is grade 1 spondylolisthesis of L4 on L5 with slight narrowing of the L4-5 and L5-S1 disk spaces. There is moderate bilateral  facet arthritis at L4-5.  No fractures or bone destruction. Moderate calcification in the distal abdominal aorta and common iliac arteries.  IMPRESSION: Degenerative disk and joint disease in the lower lumbar spine, primarily at L4-5. Grade 1 spondylolisthesis at L4-5.   Electronically Signed   By: Geanie Cooley M.D.   On: 05/24/2013 11:28    EKG Interpretation    Date/Time:    Ventricular Rate:    PR Interval:    QRS Duration:   QT Interval:    QTC Calculation:   R Axis:     Text Interpretation:              MDM  X-rays reviewed and report per radiologist.     Linna Hoff, MD 05/24/13 1201

## 2013-06-03 ENCOUNTER — Ambulatory Visit: Payer: Self-pay | Admitting: Ophthalmology

## 2013-06-03 DIAGNOSIS — Z01810 Encounter for preprocedural cardiovascular examination: Secondary | ICD-10-CM | POA: Diagnosis not present

## 2013-06-03 DIAGNOSIS — I10 Essential (primary) hypertension: Secondary | ICD-10-CM | POA: Diagnosis not present

## 2013-06-03 DIAGNOSIS — Z01812 Encounter for preprocedural laboratory examination: Secondary | ICD-10-CM | POA: Diagnosis not present

## 2013-06-03 DIAGNOSIS — Z79899 Other long term (current) drug therapy: Secondary | ICD-10-CM | POA: Diagnosis not present

## 2013-06-03 LAB — POTASSIUM: Potassium: 3.9 mmol/L (ref 3.5–5.1)

## 2013-06-11 ENCOUNTER — Ambulatory Visit: Payer: Self-pay | Admitting: Ophthalmology

## 2013-06-11 DIAGNOSIS — Z880 Allergy status to penicillin: Secondary | ICD-10-CM | POA: Diagnosis not present

## 2013-06-11 DIAGNOSIS — I10 Essential (primary) hypertension: Secondary | ICD-10-CM | POA: Diagnosis not present

## 2013-06-11 DIAGNOSIS — K219 Gastro-esophageal reflux disease without esophagitis: Secondary | ICD-10-CM | POA: Diagnosis not present

## 2013-06-11 DIAGNOSIS — H269 Unspecified cataract: Secondary | ICD-10-CM | POA: Diagnosis not present

## 2013-06-11 DIAGNOSIS — Z9089 Acquired absence of other organs: Secondary | ICD-10-CM | POA: Diagnosis not present

## 2013-06-11 DIAGNOSIS — R0609 Other forms of dyspnea: Secondary | ICD-10-CM | POA: Diagnosis not present

## 2013-06-11 DIAGNOSIS — M129 Arthropathy, unspecified: Secondary | ICD-10-CM | POA: Diagnosis not present

## 2013-06-11 DIAGNOSIS — Z7982 Long term (current) use of aspirin: Secondary | ICD-10-CM | POA: Diagnosis not present

## 2013-06-11 DIAGNOSIS — Z79899 Other long term (current) drug therapy: Secondary | ICD-10-CM | POA: Diagnosis not present

## 2013-06-11 DIAGNOSIS — Z881 Allergy status to other antibiotic agents status: Secondary | ICD-10-CM | POA: Diagnosis not present

## 2013-09-09 ENCOUNTER — Encounter: Payer: Self-pay | Admitting: Family Medicine

## 2013-09-09 ENCOUNTER — Ambulatory Visit (INDEPENDENT_AMBULATORY_CARE_PROVIDER_SITE_OTHER): Payer: BC Managed Care – PPO | Admitting: Family Medicine

## 2013-09-09 VITALS — BP 138/80 | HR 62 | Temp 97.6°F | Wt 173.4 lb

## 2013-09-09 DIAGNOSIS — L2989 Other pruritus: Secondary | ICD-10-CM | POA: Insufficient documentation

## 2013-09-09 DIAGNOSIS — R21 Rash and other nonspecific skin eruption: Secondary | ICD-10-CM | POA: Insufficient documentation

## 2013-09-09 DIAGNOSIS — L298 Other pruritus: Secondary | ICD-10-CM | POA: Insufficient documentation

## 2013-09-09 MED ORDER — TRIAMCINOLONE ACETONIDE 0.1 % EX CREA: 1.0000 "application " | TOPICAL_CREAM | Freq: Two times a day (BID) | CUTANEOUS | Status: DC

## 2013-09-09 NOTE — Progress Notes (Signed)
Pre visit review using our clinic review tool, if applicable. No additional management support is needed unless otherwise documented below in the visit note. 

## 2013-09-09 NOTE — Patient Instructions (Signed)
I wonder if this rash is from having extra sensitive skin. I recommend starting claritin for itch (and runny nose), using benadryl (1/2 to 1 tablet) as needed for breakthrough itch. Continue moisturizing cream like aveeno or eucerin (fragrance free) and may use stronger prescription topical steroid cream. Let me know if not better - would do oral course of prednisone (steroid) and if not improved could refer you to skin doctor. Continue cotton clothing.

## 2013-09-09 NOTE — Assessment & Plan Note (Addendum)
?  allergic reaction to unknown precipitant.  Not consistent with scabies - spares interdigital spaces and wrists, started along neck. Recommended continued treatment as up to now - cotton clothes, fragrance free moisturizer and soaps/shampoos - and will prescribe topical TCI cream along with starting claritin/benadryl. Update if sxs persist or fail to improve, consider oral steroid course, if not better derm referral. ?neurodermatitis.

## 2013-09-09 NOTE — Progress Notes (Signed)
   BP 138/80  Pulse 62  Temp(Src) 97.6 F (36.4 C) (Oral)  Wt 173 lb 6.4 oz (78.654 kg)   CC: rash  Subjective:    Patient ID: Gregory Abbott, male    DOB: 1945-10-27, 68 y.o.   MRN: [id number]  HPI: Gregory Abbott is a 69 y.o. male presenting on 09/09/2013 with Rash   For last 3 weeks noticing itching of arms and posterior neck.  Itch that becomes a rash - bumps.  Hasn't found any foods to precipitate rash.  No new medicines. No new lotions, creams, detergents, soaps, shampoos. Uses irish spring or dove.  [place] and hot water breaks him out. Has stopped using colognes, and decreased jewelry use.  Has tried calomine lotion and moisturizing cream and cortisone cream.  Also tried gold bond.  H/o sensitive skin - to strong deodorants. H/o childhood asthma and some persistent allergies, + h/o eczema. No fevers/chills, joint pains, nausea, recent viral illness.  Rash spares face, trunk.  Started around neck, traveling down arms.  Smoker - cutting down.  Bought patches 2 wks ago but hasn't used.  Relevant past medical, surgical, family and social history reviewed and updated as indicated.  Allergies and medications reviewed and updated. Current Outpatient Prescriptions on File Prior to Visit  Medication Sig  . aspirin EC 81 MG tablet Take 81 mg by mouth every other day.  [name] atenolol (TENORMIN) 50 MG tablet Take 100 mg by mouth.  . losartan-hydrochlorothiazide (HYZAAR) 100-12.5 MG per tablet Take 1 tablet by mouth daily.   No current facility-administered medications on file prior to visit.    Review of Systems Per HPI unless specifically indicated above    Objective:    BP 138/80  Pulse 62  Temp(Src) 97.6 F (36.4 C) (Oral)  Wt 173 lb 6.4 oz (78.654 kg)  Physical Exam  Nursing note and vitals reviewed. Constitutional: He appears well-developed and well-nourished. No distress.  Skin: Skin is warm and dry. Rash noted. No erythema.  Pruritic papular rash diffusely on arms and  around neckline, spares face, palms, soles, and legs and trunk.  + excoriations  Psychiatric: He has a normal mood and affect.       Assessment & Plan:   Problem List Items Addressed This Visit   Skin rash - Primary     ?allergic reaction to unknown precipitant.  Not consistent with scabies - spares interdigital spaces and wrists, started along neck. Recommended continued treatment as up to now - cotton clothes, fragrance free moisturizer and soaps/shampoos - and will prescribe topical TCI cream along with starting claritin/benadryl. Update if sxs persist or fail to improve, consider oral steroid course, if not better derm referral.        Follow up plan: Return if symptoms worsen or fail to improve.

## 2013-09-10 ENCOUNTER — Telehealth: Payer: Self-pay | Admitting: Family Medicine

## 2013-09-10 NOTE — Telephone Encounter (Signed)
Relevant patient education assigned to patient using Emmi. ° °

## 2013-11-06 ENCOUNTER — Encounter (HOSPITAL_COMMUNITY): Payer: Self-pay | Admitting: Emergency Medicine

## 2013-11-06 ENCOUNTER — Inpatient Hospital Stay (HOSPITAL_COMMUNITY)
Admission: EM | Admit: 2013-11-06 | Discharge: 2013-11-09 | DRG: 190 | Disposition: A | Discharge status: Home / Self Care | Payer: Medicare Other | Attending: Internal Medicine | Admitting: Internal Medicine

## 2013-11-06 ENCOUNTER — Emergency Department (HOSPITAL_COMMUNITY): Payer: Medicare Other

## 2013-11-06 DIAGNOSIS — Z880 Allergy status to penicillin: Secondary | ICD-10-CM

## 2013-11-06 DIAGNOSIS — J9600 Acute respiratory failure, unspecified whether with hypoxia or hypercapnia: Secondary | ICD-10-CM | POA: Diagnosis present

## 2013-11-06 DIAGNOSIS — Z8711 Personal history of peptic ulcer disease: Secondary | ICD-10-CM

## 2013-11-06 DIAGNOSIS — J209 Acute bronchitis, unspecified: Principal | ICD-10-CM | POA: Diagnosis present

## 2013-11-06 DIAGNOSIS — M1990 Unspecified osteoarthritis, unspecified site: Secondary | ICD-10-CM | POA: Diagnosis present

## 2013-11-06 DIAGNOSIS — N400 Enlarged prostate without lower urinary tract symptoms: Secondary | ICD-10-CM | POA: Diagnosis present

## 2013-11-06 DIAGNOSIS — J440 Chronic obstructive pulmonary disease with acute lower respiratory infection: Principal | ICD-10-CM | POA: Diagnosis present

## 2013-11-06 DIAGNOSIS — R0602 Shortness of breath: Secondary | ICD-10-CM | POA: Diagnosis not present

## 2013-11-06 DIAGNOSIS — J45901 Unspecified asthma with (acute) exacerbation: Secondary | ICD-10-CM | POA: Diagnosis present

## 2013-11-06 DIAGNOSIS — F17200 Nicotine dependence, unspecified, uncomplicated: Secondary | ICD-10-CM | POA: Diagnosis present

## 2013-11-06 DIAGNOSIS — Z888 Allergy status to other drugs, medicaments and biological substances status: Secondary | ICD-10-CM

## 2013-11-06 DIAGNOSIS — Z7982 Long term (current) use of aspirin: Secondary | ICD-10-CM

## 2013-11-06 DIAGNOSIS — J40 Bronchitis, not specified as acute or chronic: Secondary | ICD-10-CM | POA: Diagnosis not present

## 2013-11-06 DIAGNOSIS — J441 Chronic obstructive pulmonary disease with (acute) exacerbation: Secondary | ICD-10-CM | POA: Diagnosis present

## 2013-11-06 DIAGNOSIS — J189 Pneumonia, unspecified organism: Secondary | ICD-10-CM | POA: Diagnosis present

## 2013-11-06 DIAGNOSIS — Z79899 Other long term (current) drug therapy: Secondary | ICD-10-CM | POA: Diagnosis not present

## 2013-11-06 DIAGNOSIS — I10 Essential (primary) hypertension: Secondary | ICD-10-CM | POA: Diagnosis present

## 2013-11-06 DIAGNOSIS — I498 Other specified cardiac arrhythmias: Secondary | ICD-10-CM | POA: Diagnosis not present

## 2013-11-06 DIAGNOSIS — J218 Acute bronchiolitis due to other specified organisms: Secondary | ICD-10-CM | POA: Diagnosis not present

## 2013-11-06 HISTORY — DX: Shortness of breath: R06.02

## 2013-11-06 MED ORDER — IPRATROPIUM-ALBUTEROL 0.5-2.5 (3) MG/3ML IN SOLN
3.0000 mL | Freq: Once | RESPIRATORY_TRACT | Status: AC
  Administered 2013-11-06: 3 mL via RESPIRATORY_TRACT
  Filled 2013-11-06: qty 3

## 2013-11-06 MED ORDER — METHYLPREDNISOLONE SODIUM SUCC 125 MG IJ SOLR
125.0000 mg | Freq: Once | INTRAMUSCULAR | Status: AC
  Administered 2013-11-07: 125 mg via INTRAVENOUS
  Filled 2013-11-06: qty 2

## 2013-11-06 NOTE — ED Notes (Signed)
Patient with increased shortness of breath for the last couple of days.  Patient states he does have bronchitis, has been using staining and painting, compound dust which has irritated his airway.  Patient is in distress, denies any chest pain.

## 2013-11-07 ENCOUNTER — Encounter (HOSPITAL_COMMUNITY): Payer: Self-pay | Admitting: Emergency Medicine

## 2013-11-07 DIAGNOSIS — J40 Bronchitis, not specified as acute or chronic: Secondary | ICD-10-CM | POA: Diagnosis not present

## 2013-11-07 DIAGNOSIS — J9801 Acute bronchospasm: Secondary | ICD-10-CM | POA: Insufficient documentation

## 2013-11-07 DIAGNOSIS — I10 Essential (primary) hypertension: Secondary | ICD-10-CM | POA: Diagnosis not present

## 2013-11-07 DIAGNOSIS — J209 Acute bronchitis, unspecified: Secondary | ICD-10-CM

## 2013-11-07 LAB — BASIC METABOLIC PANEL
BUN: 14 mg/dL (ref 6–23)
CO2: 26 mEq/L (ref 19–32)
CREATININE: 0.77 mg/dL (ref 0.50–1.35)
Calcium: 9.4 mg/dL (ref 8.4–10.5)
Chloride: 102 mEq/L (ref 96–112)
Glucose, Bld: 107 mg/dL — ABNORMAL HIGH (ref 70–99)
Potassium: 3.8 mEq/L (ref 3.7–5.3)
Sodium: 138 mEq/L (ref 137–147)

## 2013-11-07 LAB — CBC WITH DIFFERENTIAL/PLATELET
BASOS ABS: 0 10*3/uL (ref 0.0–0.1)
Basophils Absolute: 0 10*3/uL (ref 0.0–0.1)
Basophils Relative: 0 % (ref 0–1)
Basophils Relative: 0 % (ref 0–1)
EOS ABS: 0 10*3/uL (ref 0.0–0.7)
Eosinophils Absolute: 0 10*3/uL (ref 0.0–0.7)
Eosinophils Relative: 0 % (ref 0–5)
Eosinophils Relative: 0 % (ref 0–5)
HCT: 48 % (ref 39.0–52.0)
HCT: 48.7 % (ref 39.0–52.0)
HEMOGLOBIN: 17 g/dL (ref 13.0–17.0)
Hemoglobin: 16.6 g/dL (ref 13.0–17.0)
LYMPHS ABS: 0.3 10*3/uL — AB (ref 0.7–4.0)
Lymphocytes Relative: 22 % (ref 12–46)
Lymphocytes Relative: 3 % — ABNORMAL LOW (ref 12–46)
Lymphs Abs: 1.9 10*3/uL (ref 0.7–4.0)
MCH: 32.9 pg (ref 26.0–34.0)
MCH: 33.1 pg (ref 26.0–34.0)
MCHC: 34.6 g/dL (ref 30.0–36.0)
MCHC: 34.9 g/dL (ref 30.0–36.0)
MCV: 94.9 fL (ref 78.0–100.0)
MCV: 95.2 fL (ref 78.0–100.0)
MONOS PCT: 8 % (ref 3–12)
Monocytes Absolute: 0 10*3/uL — ABNORMAL LOW (ref 0.1–1.0)
Monocytes Absolute: 0.7 10*3/uL (ref 0.1–1.0)
Monocytes Relative: 0 % — ABNORMAL LOW (ref 3–12)
NEUTROS ABS: 6.1 10*3/uL (ref 1.7–7.7)
NEUTROS PCT: 70 % (ref 43–77)
NEUTROS PCT: 97 % — AB (ref 43–77)
Neutro Abs: 8.5 10*3/uL — ABNORMAL HIGH (ref 1.7–7.7)
PLATELETS: 328 10*3/uL (ref 150–400)
Platelets: 288 10*3/uL (ref 150–400)
RBC: 5.04 MIL/uL (ref 4.22–5.81)
RBC: 5.13 MIL/uL (ref 4.22–5.81)
RDW: 13.5 % (ref 11.5–15.5)
RDW: 13.7 % (ref 11.5–15.5)
WBC: 8.8 10*3/uL (ref 4.0–10.5)
WBC: 8.8 10*3/uL (ref 4.0–10.5)

## 2013-11-07 LAB — D-DIMER, QUANTITATIVE (NOT AT ARMC): D-Dimer, Quant: <0.27 ug/mL-FEU (ref 0.00–0.48)

## 2013-11-07 LAB — COMPREHENSIVE METABOLIC PANEL
ALBUMIN: 3.5 g/dL (ref 3.5–5.2)
ALT: 14 U/L (ref 0–53)
AST: 15 U/L (ref 0–37)
Alkaline Phosphatase: 63 U/L (ref 39–117)
BILIRUBIN TOTAL: 0.4 mg/dL (ref 0.3–1.2)
BUN: 11 mg/dL (ref 6–23)
CHLORIDE: 100 meq/L (ref 96–112)
CO2: 22 mEq/L (ref 19–32)
Calcium: 9.4 mg/dL (ref 8.4–10.5)
Creatinine, Ser: 0.71 mg/dL (ref 0.50–1.35)
GFR calc Af Amer: >90 mL/min (ref >90)
GFR calc non Af Amer: >90 mL/min (ref >90)
Glucose, Bld: 219 mg/dL — ABNORMAL HIGH (ref 70–99)
Potassium: 3.7 mEq/L (ref 3.7–5.3)
Sodium: 139 mEq/L (ref 137–147)
Total Protein: 7.4 g/dL (ref 6.0–8.3)

## 2013-11-07 LAB — PRO B NATRIURETIC PEPTIDE: Pro B Natriuretic peptide (BNP): 56.2 pg/mL (ref 0–125)

## 2013-11-07 LAB — TROPONIN I: Troponin I: <0.3 ng/mL (ref <0.30)

## 2013-11-07 MED ORDER — SODIUM CHLORIDE 0.9 % IJ SOLN
3.0000 mL | Freq: Two times a day (BID) | INTRAMUSCULAR | Status: DC
  Administered 2013-11-07 – 2013-11-08 (×4): 3 mL via INTRAVENOUS

## 2013-11-07 MED ORDER — ONDANSETRON HCL 4 MG PO TABS: 4.0000 mg | ORAL_TABLET | Freq: Four times a day (QID) | ORAL | Status: DC | PRN

## 2013-11-07 MED ORDER — ATENOLOL 100 MG PO TABS
100.0000 mg | ORAL_TABLET | Freq: Every day | ORAL | Status: DC
  Administered 2013-11-07 – 2013-11-09 (×3): 100 mg via ORAL
  Filled 2013-11-07 (×3): qty 1

## 2013-11-07 MED ORDER — SODIUM CHLORIDE 0.9 % IJ SOLN
3.0000 mL | Freq: Two times a day (BID) | INTRAMUSCULAR | Status: DC
  Administered 2013-11-07 – 2013-11-09 (×4): 3 mL via INTRAVENOUS

## 2013-11-07 MED ORDER — METHYLPREDNISOLONE SODIUM SUCC 125 MG IJ SOLR
60.0000 mg | Freq: Four times a day (QID) | INTRAMUSCULAR | Status: DC
  Administered 2013-11-07 – 2013-11-09 (×9): 60 mg via INTRAVENOUS
  Filled 2013-11-07 (×13): qty 0.96

## 2013-11-07 MED ORDER — IPRATROPIUM BROMIDE 0.02 % IN SOLN: 0.5000 mg | RESPIRATORY_TRACT | Status: DC

## 2013-11-07 MED ORDER — LOSARTAN POTASSIUM 50 MG PO TABS
100.0000 mg | ORAL_TABLET | Freq: Every day | ORAL | Status: DC
  Administered 2013-11-07 – 2013-11-09 (×3): 100 mg via ORAL
  Filled 2013-11-07 (×3): qty 2

## 2013-11-07 MED ORDER — BENZONATATE 100 MG PO CAPS
100.0000 mg | ORAL_CAPSULE | Freq: Three times a day (TID) | ORAL | Status: DC
  Administered 2013-11-07 – 2013-11-09 (×7): 100 mg via ORAL
  Filled 2013-11-07 (×9): qty 1

## 2013-11-07 MED ORDER — ALBUTEROL (5 MG/ML) CONTINUOUS INHALATION SOLN
10.0000 mg/h | INHALATION_SOLUTION | Freq: Once | RESPIRATORY_TRACT | Status: AC
  Administered 2013-11-07: 10 mg/h via RESPIRATORY_TRACT
  Filled 2013-11-07: qty 20

## 2013-11-07 MED ORDER — ACETAMINOPHEN 650 MG RE SUPP: 650.0000 mg | Freq: Four times a day (QID) | RECTAL | Status: DC | PRN

## 2013-11-07 MED ORDER — LORATADINE 10 MG PO TABS
10.0000 mg | ORAL_TABLET | Freq: Every day | ORAL | Status: DC | PRN
  Filled 2013-11-07: qty 1

## 2013-11-07 MED ORDER — ACETAMINOPHEN 325 MG PO TABS: 650.0000 mg | ORAL_TABLET | Freq: Four times a day (QID) | ORAL | Status: DC | PRN

## 2013-11-07 MED ORDER — ONDANSETRON HCL 4 MG/2ML IJ SOLN
4.0000 mg | Freq: Four times a day (QID) | INTRAMUSCULAR | Status: DC | PRN
Start: 2013-11-07 — End: 2013-11-09

## 2013-11-07 MED ORDER — ENOXAPARIN SODIUM 40 MG/0.4ML ~~LOC~~ SOLN
40.0000 mg | Freq: Every day | SUBCUTANEOUS | Status: DC
  Administered 2013-11-07 – 2013-11-09 (×3): 40 mg via SUBCUTANEOUS
  Filled 2013-11-07 (×3): qty 0.4

## 2013-11-07 MED ORDER — IPRATROPIUM-ALBUTEROL 0.5-2.5 (3) MG/3ML IN SOLN
3.0000 mL | Freq: Once | RESPIRATORY_TRACT | Status: AC
  Administered 2013-11-07: 3 mL via RESPIRATORY_TRACT
  Filled 2013-11-07: qty 3

## 2013-11-07 MED ORDER — ASPIRIN EC 81 MG PO TBEC
81.0000 mg | DELAYED_RELEASE_TABLET | ORAL | Status: DC
  Administered 2013-11-07 – 2013-11-09 (×2): 81 mg via ORAL
  Filled 2013-11-07 (×2): qty 1

## 2013-11-07 MED ORDER — IPRATROPIUM-ALBUTEROL 0.5-2.5 (3) MG/3ML IN SOLN
3.0000 mL | RESPIRATORY_TRACT | Status: DC
  Administered 2013-11-07 (×4): 3 mL via RESPIRATORY_TRACT
  Filled 2013-11-07 (×4): qty 3

## 2013-11-07 MED ORDER — ALBUTEROL SULFATE (2.5 MG/3ML) 0.083% IN NEBU: 2.5000 mg | INHALATION_SOLUTION | RESPIRATORY_TRACT | Status: DC | PRN

## 2013-11-07 MED ORDER — LEVOFLOXACIN IN D5W 750 MG/150ML IV SOLN
750.0000 mg | Freq: Every day | INTRAVENOUS | Status: DC
  Administered 2013-11-07 – 2013-11-08 (×2): 750 mg via INTRAVENOUS
  Filled 2013-11-07 (×2): qty 150

## 2013-11-07 MED ORDER — METHYLPREDNISOLONE SODIUM SUCC 40 MG IJ SOLR: 40.0000 mg | INTRAMUSCULAR | Status: DC

## 2013-11-07 MED ORDER — ALBUTEROL SULFATE (2.5 MG/3ML) 0.083% IN NEBU: 2.5000 mg | INHALATION_SOLUTION | RESPIRATORY_TRACT | Status: DC

## 2013-11-07 MED ORDER — HYDROCHLOROTHIAZIDE 12.5 MG PO CAPS
12.5000 mg | ORAL_CAPSULE | Freq: Every day | ORAL | Status: DC
  Administered 2013-11-07 – 2013-11-09 (×3): 12.5 mg via ORAL
  Filled 2013-11-07 (×3): qty 1

## 2013-11-07 MED ORDER — BUDESONIDE 0.25 MG/2ML IN SUSP
0.2500 mg | Freq: Two times a day (BID) | RESPIRATORY_TRACT | Status: DC
  Administered 2013-11-07 – 2013-11-09 (×5): 0.25 mg via RESPIRATORY_TRACT
  Filled 2013-11-07 (×8): qty 2

## 2013-11-07 MED ORDER — IPRATROPIUM-ALBUTEROL 0.5-2.5 (3) MG/3ML IN SOLN
3.0000 mL | Freq: Two times a day (BID) | RESPIRATORY_TRACT | Status: DC
  Administered 2013-11-08 – 2013-11-09 (×3): 3 mL via RESPIRATORY_TRACT
  Filled 2013-11-07 (×3): qty 3

## 2013-11-07 MED ORDER — LOSARTAN POTASSIUM-HCTZ 100-12.5 MG PO TABS: 1.0000 | ORAL_TABLET | Freq: Every day | ORAL | Status: DC

## 2013-11-07 NOTE — ED Provider Notes (Signed)
CSN: [id number]     Arrival date & time 11/06/13  2302 History   First MD Initiated Contact with Patient 11/06/13 2330     Chief Complaint  Patient presents with  . Shortness of Breath     (Consider location/radiation/quality/duration/timing/severity/associated sxs/prior Treatment) HPI 68 year old male presents emergency department from home with complaint of shortness of breath.  He reports that he's had increasing shortness of breath over the last several days to a week.  He notices the shortness of breath worse at night and with exertion.  He denies any swelling in his legs.  No history of CHF.  No chest pain.  He has history of asthma remotely as a child.  He reports he's had work done on his house for the last 2 weeks, reports exposure to compound dust, sustaining fumes.  He has not been the one doing the work, having done by Chief Strategy Officer.  He denies any fever.  He has a dry cough.  Patient has history of frequent bronchitis.  He is a smoker.  Shortness of breath worse tonight for him to the ER. Past Medical History  Diagnosis Date  . Childhood asthma   . History of chicken pox   . Hypertension   . Lower back pain     h/o herniated and bulging discs lower back  . Smoker   . BPH (benign prostatic hypertrophy)   . Osteoarthritis of neck   . PUD (peptic ulcer disease)   . Blood transfusion without reported diagnosis   . Cataract   . Personal history of colonic polyps 05/07/2013    resected not retrieved  . S/P splenectomy     partial   Past Surgical History  Procedure Laterality Date  . Splenectomy, partial  1998    fall down stairs  . Colonoscopy  2009  . Lower extremity arterial doppler  2013    WNL  . Knee arthroscopy    . Aaa screen  04/2013    neg for AAA  . Colonoscopy  05/2013    1 polyp, int/ext hemorrhoids Carlean Purl)  . Cataract extraction Left 05/2013    [facility]   Family History  Problem Relation Age of Onset  . Diabetes Mother   . Hypertension Mother   .  Hyperlipidemia Mother   . Heart disease Mother   . Stroke Father   . Colon cancer Neg Hx   . Rectal cancer Neg Hx   . Stomach cancer Neg Hx   . Esophageal cancer Neg Hx    History  Substance Use Topics  . Smoking status: Current Every Day Smoker -- 0.25 packs/day    Types: Cigarettes  . Smokeless tobacco: Never Used     Comment: smoking e-cigaretts trying to quit  . Alcohol Use: 5.4 oz/week    6 Cans of beer, 3 Shots of liquor per week     Comment: Social    Review of Systems   See History of Present Illness; otherwise all other systems are reviewed and negative  Allergies  Penicillins and Azithromycin  Home Medications   Prior to Admission medications   Medication Sig Start Date End Date Taking? Authorizing Provider  aspirin EC 81 MG tablet Take 81 mg by mouth every other day.   Yes Historical Provider, MD  atenolol (TENORMIN) 50 MG tablet Take 100 mg by mouth. 02/01/13  Yes Ria Bush, MD  loratadine (CLARITIN) 10 MG tablet Take 10 mg by mouth daily as needed for allergies.   Yes Historical Provider,  MD  losartan-hydrochlorothiazide (HYZAAR) 100-12.5 MG per tablet Take 1 tablet by mouth daily. 10/22/12  Yes Ria Bush, MD  Pseudoeph-Doxylamine-DM-APAP (NYQUIL PO) Take 30 mLs by mouth 2 (two) times daily as needed (for congestion).   Yes Historical Provider, MD   BP 163/94  Pulse 106  Resp 30  SpO2 94% Physical Exam  Nursing note and vitals reviewed. Constitutional: He is oriented to person, place, and time. He appears well-developed and well-nourished. He appears distressed.  HENT:  Head: Normocephalic and atraumatic.  Nose: Nose normal.  Mouth/Throat: Oropharynx is clear and moist.  Eyes: Conjunctivae and EOM are normal. Pupils are equal, round, and reactive to light.  Neck: Normal range of motion. Neck supple. No JVD present. No tracheal deviation present. No thyromegaly present.  Cardiovascular: Normal rate, regular rhythm, normal heart sounds and intact  distal pulses.  Exam reveals no gallop and no friction rub.   No murmur heard. Pulmonary/Chest: No stridor. He is in respiratory distress. He has wheezes. He has no rales. He exhibits no tenderness.  tripoding, accessory muscle use.  Patient is able to speak in short sentences.  Abdominal: Soft. Bowel sounds are normal. He exhibits no distension and no mass. There is no tenderness. There is no rebound and no guarding.  Musculoskeletal: Normal range of motion. He exhibits no edema and no tenderness.  Lymphadenopathy:    He has no cervical adenopathy.  Neurological: He is alert and oriented to person, place, and time. He exhibits normal muscle tone. Coordination normal.  Skin: Skin is warm and dry. No rash noted. No erythema. No pallor.  Psychiatric: He has a normal mood and affect. His behavior is normal. Judgment and thought content normal.    ED Course  Procedures (including critical care time) Labs Review Labs Reviewed  BASIC METABOLIC PANEL - Abnormal; Notable for the following:    Glucose, Bld 107 (*)    All other components within normal limits  CBC WITH DIFFERENTIAL    Imaging Review Dg Chest Port 1 View  11/07/2013   CLINICAL DATA:  Shortness of breath, cough, congestion  EXAM: PORTABLE CHEST - 1 VIEW  COMPARISON:  None.  FINDINGS: The heart size and mediastinal contours are within normal limits. Both lungs are clear. The visualized skeletal structures are unremarkable.  IMPRESSION: No active disease.   Electronically Signed   By: Kathreen Devoid   On: 11/07/2013 00:09     EKG Interpretation   Date/Time:  Wednesday Nov 06 2013 23:05:37 EDT Ventricular Rate:  107 PR Interval:  210 QRS Duration: 102 QT Interval:  334 QTC Calculation: 445 R Axis:   84 Text Interpretation:  Sinus tachycardia with 1st degree A-V block Possible  Left atrial enlargement Borderline ECG No old tracing to compare Confirmed  by Fiona Coto  MD, Alexiah Koroma ([id number]) on 11/07/2013 1:03:24 AM      MDM   Final  diagnoses:  Bronchospasm with bronchitis, acute    68 year old male, initially hypoxic upon arrival with moderate respiratory distress.  Suspect bronchospasm.  Will treat with steroids, breathing treatment, will check chest x-ray baseline labs.  2:08 AM Pt has had two neb tx, and still has significant wheezing, sob.  Plan for hour continuous neb, possible admit.  3:49 AM Only slight improvement with hour neb, plan for admission  Kalman Drape, MD 11/07/13 [phone]

## 2013-11-07 NOTE — H&P (Signed)
[facility] History and Physical  Gregory Abbott EGB:[id number] DOB: 01/01/1946 DOA: 11/06/2013  Referring physician: ER physician. PCP: Ria Bush, MD   Chief Complaint: Shortness of breath.  HPI: Gregory Abbott is a 68 y.o. male with history of tobacco abuse, hypertension and arthritis has been experiencing wheezing productive cough over the last 3-4 days. Denies any chest pain. Patient also get easily short winded exertion. Patient states that there have been some renovation work going on in the house. In the ER patient was found to be wheezing and was placed on nebulizer and steroid. Chest x-ray does not show anything acute. Patient has been admitted for possible bronchitis. Over the last few weeks patient states he has been noticing episodes of drenching sweats. Patient presently is afebrile. Patient otherwise denies any nausea vomiting abdominal pain or diarrhea.   Review of Systems: As presented in the history of presenting illness, rest negative.  Past Medical History  Diagnosis Date  . Childhood asthma   . History of chicken pox   . Hypertension   . Lower back pain     h/o herniated and bulging discs lower back  . Smoker   . BPH (benign prostatic hypertrophy)   . Osteoarthritis of neck   . PUD (peptic ulcer disease)   . Blood transfusion without reported diagnosis   . Cataract   . Personal history of colonic polyps 05/07/2013    resected not retrieved  . S/P splenectomy     partial  . Shortness of breath    Past Surgical History  Procedure Laterality Date  . Splenectomy, partial  1998    fall down stairs  . Colonoscopy  2009  . Lower extremity arterial doppler  2013    WNL  . Knee arthroscopy    . Aaa screen  04/2013    neg for AAA  . Colonoscopy  05/2013    1 polyp, int/ext hemorrhoids Carlean Purl)  . Cataract extraction Left 05/2013    [facility]   Social History:  reports that he has been smoking Cigarettes.  He has been smoking about 0.25 packs per  day. He has never used smokeless tobacco. He reports that he drinks about 5.4 ounces of alcohol per week. He reports that he does not use illicit drugs. Where does patient live home Can patient participate in ADLs? Yes  Allergies  Allergen Reactions  . Penicillins Other (See Comments)    Unknown - as child. ?asthma  . Azithromycin Rash    Family History:  Family History  Problem Relation Age of Onset  . Diabetes Mother   . Hypertension Mother   . Hyperlipidemia Mother   . Heart disease Mother   . Stroke Father   . Colon cancer Neg Hx   . Rectal cancer Neg Hx   . Stomach cancer Neg Hx   . Esophageal cancer Neg Hx       Prior to Admission medications   Medication Sig Start Date End Date Taking? Authorizing Provider  aspirin EC 81 MG tablet Take 81 mg by mouth every other day.   Yes Historical Provider, MD  atenolol (TENORMIN) 50 MG tablet Take 100 mg by mouth. 02/01/13  Yes Ria Bush, MD  loratadine (CLARITIN) 10 MG tablet Take 10 mg by mouth daily as needed for allergies.   Yes Historical Provider, MD  losartan-hydrochlorothiazide (HYZAAR) 100-12.5 MG per tablet Take 1 tablet by mouth daily. 10/22/12  Yes Ria Bush, MD  Pseudoeph-Doxylamine-DM-APAP (NYQUIL PO) Take 30 mLs by mouth 2 (  two) times daily as needed (for congestion).   Yes Historical Provider, MD    Physical Exam: Filed Vitals:   11/07/13 0213 11/07/13 0300 11/07/13 0448 11/07/13 0451  BP:  145/82  158/85  Pulse:  88  102  Temp:    97.8 F (36.6 C)  TempSrc:    Oral  Resp:  21  21  Height:   6' (1.829 m) 6' (1.829 m)  Weight:   74.254 kg (163 lb 11.2 oz) 74.254 kg (163 lb 11.2 oz)  SpO2: 95% 100%  100%     General:  Well-developed and nourished.  Eyes: Anicteric no pallor.  ENT: No discharge from ears eyes nose mouth.  Neck: No mass felt.  Cardiovascular: S1-S2 heard.  Respiratory: Mild expiratory wheeze heard no crepitations.  Abdomen: Soft nontender bowel sounds present no guarding  or rigidity.  Skin: No rash.  Musculoskeletal: No edema.  Psychiatric: Appears normal.  Neurologic: Alert awake oriented to time place and person. Moves all extremities.  Labs on Admission:  Basic Metabolic Panel:  Recent Labs Lab 11/06/13 2357  NA 138  K 3.8  CL 102  CO2 26  GLUCOSE 107*  BUN 14  CREATININE 0.77  CALCIUM 9.4   Liver Function Tests: No results found for this basename: AST, ALT, ALKPHOS, BILITOT, PROT, ALBUMIN,  in the last 168 hours No results found for this basename: LIPASE, AMYLASE,  in the last 168 hours No results found for this basename: AMMONIA,  in the last 168 hours CBC:  Recent Labs Lab 11/06/13 2357  WBC 8.8  NEUTROABS 6.1  HGB 17.0  HCT 48.7  MCV 94.9  PLT 288   Cardiac Enzymes: No results found for this basename: CKTOTAL, CKMB, CKMBINDEX, TROPONINI,  in the last 168 hours  BNP (last 3 results) No results found for this basename: PROBNP,  in the last 8760 hours CBG: No results found for this basename: GLUCAP,  in the last 168 hours  Radiological Exams on Admission: Dg Chest Port 1 View  11/07/2013   CLINICAL DATA:  Shortness of breath, cough, congestion  EXAM: PORTABLE CHEST - 1 VIEW  COMPARISON:  None.  FINDINGS: The heart size and mediastinal contours are within normal limits. Both lungs are clear. The visualized skeletal structures are unremarkable.  IMPRESSION: No active disease.   Electronically Signed   By: Kathreen Devoid   On: 11/07/2013 00:09    EKG: Independently reviewed. Sinus tachycardia with first-degree AV block.  Assessment/Plan Principal Problem:   Bronchospasm with bronchitis, acute Active Problems:   Hypertension   Bronchitis   1. Shortness of breath with wheezing most likely secondary to acute bronchitis - patient has been placed on Solu-Medrol Pulmicort nebulizer. Could also be reactive airway disease to the renovation going on at his house. Since patient has exertional symptoms I have ordered BNP troponin  and d-dimer. 2. Hypertension - continue home medications. 3. Tobacco abuse - advised to quit smoking. 4. History of arthritis - no acute issues.  Patient states he drinks alcohol 2 or 3 times a week.    Code Status: Full code.  Family Communication: Patient wife at the bedside.  Disposition Plan: Admit to inpatient.    [facility] Pager [phone].  If 7PM-7AM, please contact night-coverage [email or web address] Password [email or web address] 11/07/2013, 5:40 AM

## 2013-11-07 NOTE — Progress Notes (Signed)
Utilization Review Completed Arlyn Bumpus J. Nathania Waldman, RN, BSN, NCM [phone]  

## 2013-11-07 NOTE — ED Notes (Signed)
Paged Respiratory for hour long neb.

## 2013-11-07 NOTE — Progress Notes (Signed)
[facility] PROGRESS NOTE  Gregory Abbott HUT:[id number] DOB: 1946/06/30 DOA: 11/06/2013 PCP: Ria Bush, MD  Assessment/Plan: 1. Chronic obstructive pulmonary disease exacerbation -Patient with a 40-pack-year history of tobacco abuse, presented with increasing cough, wheezing and shortness of breath -Continue Solu-Medrol 60 mg IV every 6 hours, scheduled to nebs every 4 hours, inhaled steroid, supplemental oxygen  2.  Acute respiratory failure -Evidence by an O2 sat of 85% on room air, respiratory rate of 30 on admission -Likely secondary to COPD exacerbation -Clinically improving  3. Hypertension -Continue losartan milligrams by mouth daily, atenolol 100 mg by mouth daily, hydrochlorothiazide 12.5 mg by mouth daily    Code Status: Full Code Family Communication:  Disposition Plan: Continue supportive care, IV steroids, Nebs    HPI/Subjective: Patient is a pleasant 68 year old gentleman with a past medical history of tobacco abuse, chronic obstructive pulmonary disease, admitted to the medicine service on 11/07/2013. He presented with complaints of cough, shortness of breath, wheezing over the past 3-4 days prior to presentation. Initial chest x-ray did not reveal acute infiltrate. He was started on steroids for likely COPD exacerbation  Objective: Filed Vitals:   11/07/13 1341  BP: 148/73  Pulse: 91  Temp: 98.7 F (37.1 C)  Resp: 20    Intake/Output Summary (Last 24 hours) at 11/07/13 1418 Last data filed at 11/07/13 1341  Gross per 24 hour  Intake    600 ml  Output      0 ml  Net    600 ml   Filed Weights   11/07/13 0448 11/07/13 0451  Weight: 74.254 kg (163 lb 11.2 oz) 74.254 kg (163 lb 11.2 oz)    Exam:   General:  Patient is in mild distress, actively wheezing, becomes dyspneic with speaking  Cardiovascular: Regular rate rhythm normal S1-S2, no murmurs rubs or gallops  Respiratory: Diminished breath sounds bilaterally with bilateral expiratory  wheezes  Abdomen: Soft nontender  Musculoskeletal: No edema  Data Reviewed: Basic Metabolic Panel:  Recent Labs Lab 11/06/13 2357 11/07/13 0705  NA 138 139  K 3.8 3.7  CL 102 100  CO2 26 22  GLUCOSE 107* 219*  BUN 14 11  CREATININE 0.77 0.71  CALCIUM 9.4 9.4   Liver Function Tests:  Recent Labs Lab 11/07/13 0705  AST 15  ALT 14  ALKPHOS 63  BILITOT 0.4  PROT 7.4  ALBUMIN 3.5   No results found for this basename: LIPASE, AMYLASE,  in the last 168 hours No results found for this basename: AMMONIA,  in the last 168 hours CBC:  Recent Labs Lab 11/06/13 2357 11/07/13 0705  WBC 8.8 8.8  NEUTROABS 6.1 8.5*  HGB 17.0 16.6  HCT 48.7 48.0  MCV 94.9 95.2  PLT 288 328   Cardiac Enzymes:  Recent Labs Lab 11/07/13 0705  TROPONINI <0.30   BNP (last 3 results)  Recent Labs  11/07/13 0705  PROBNP 56.2   CBG: No results found for this basename: GLUCAP,  in the last 168 hours  No results found for this or any previous visit (from the past 240 hour(s)).   Studies: Dg Chest Port 1 View  11/07/2013   CLINICAL DATA:  Shortness of breath, cough, congestion  EXAM: PORTABLE CHEST - 1 VIEW  COMPARISON:  None.  FINDINGS: The heart size and mediastinal contours are within normal limits. Both lungs are clear. The visualized skeletal structures are unremarkable.  IMPRESSION: No active disease.   Electronically Signed   By: Kathreen Devoid   On:  11/07/2013 00:09    Scheduled Meds: . aspirin EC  81 mg Oral QODAY  . atenolol  100 mg Oral Daily  . benzonatate  100 mg Oral TID  . budesonide (PULMICORT) nebulizer solution  0.25 mg Nebulization BID  . enoxaparin (LOVENOX) injection  40 mg Subcutaneous Daily  . losartan  100 mg Oral Daily   And  . hydrochlorothiazide  12.5 mg Oral Daily  . ipratropium-albuterol  3 mL Nebulization Q4H  . methylPREDNISolone (SOLU-MEDROL) injection  60 mg Intravenous Q6H  . sodium chloride  3 mL Intravenous Q12H  . sodium chloride  3 mL  Intravenous Q12H   Continuous Infusions:   Principal Problem:   Bronchospasm with bronchitis, acute Active Problems:   Hypertension   Bronchitis    Time spent: 35 min   [facility] Pager [phone]. If 7PM-7AM, please contact night-coverage at [email or web address], password [email or web address] 11/07/2013, 2:18 PM  LOS: 1 day

## 2013-11-07 NOTE — ED Notes (Signed)
RN RECHECKED O2 SAT, 95% on RA.

## 2013-11-08 DIAGNOSIS — J441 Chronic obstructive pulmonary disease with (acute) exacerbation: Secondary | ICD-10-CM

## 2013-11-08 DIAGNOSIS — F17200 Nicotine dependence, unspecified, uncomplicated: Secondary | ICD-10-CM

## 2013-11-08 DIAGNOSIS — I10 Essential (primary) hypertension: Secondary | ICD-10-CM | POA: Diagnosis not present

## 2013-11-08 DIAGNOSIS — J209 Acute bronchitis, unspecified: Secondary | ICD-10-CM | POA: Diagnosis not present

## 2013-11-08 LAB — CBC
HCT: 47.1 % (ref 39.0–52.0)
HEMOGLOBIN: 16.6 g/dL (ref 13.0–17.0)
MCH: 33.3 pg (ref 26.0–34.0)
MCHC: 35.2 g/dL (ref 30.0–36.0)
MCV: 94.4 fL (ref 78.0–100.0)
Platelets: 318 10*3/uL (ref 150–400)
RBC: 4.99 MIL/uL (ref 4.22–5.81)
RDW: 13.8 % (ref 11.5–15.5)
WBC: 21 10*3/uL — ABNORMAL HIGH (ref 4.0–10.5)

## 2013-11-08 LAB — BASIC METABOLIC PANEL
BUN: 16 mg/dL (ref 6–23)
CO2: 23 meq/L (ref 19–32)
Calcium: 9.7 mg/dL (ref 8.4–10.5)
Chloride: 97 mEq/L (ref 96–112)
Creatinine, Ser: 0.78 mg/dL (ref 0.50–1.35)
GFR calc Af Amer: >90 mL/min (ref >90)
GLUCOSE: 165 mg/dL — AB (ref 70–99)
POTASSIUM: 4.1 meq/L (ref 3.7–5.3)
SODIUM: 137 meq/L (ref 137–147)

## 2013-11-08 MED ORDER — LEVOFLOXACIN 750 MG PO TABS
750.0000 mg | ORAL_TABLET | Freq: Every day | ORAL | Status: DC
  Administered 2013-11-09: 750 mg via ORAL
  Filled 2013-11-08: qty 1

## 2013-11-08 NOTE — Progress Notes (Addendum)
[facility] PROGRESS NOTE  Kiko Ripp EVO:[id number] DOB: Mar 21, 1946 DOA: 11/06/2013 PCP: Ria Bush, MD  Assessment/Plan: 1. Chronic obstructive pulmonary disease exacerbation -Patient with a 40-pack-year history of tobacco abuse, presented with increasing cough, wheezing and shortness of breath -Continue Solu-Medrol 60 mg IV every 6 hours, scheduled to nebs every 4 hours, inhaled steroid, supplemental oxygen -Improving today however not at baaseline  2.  Acute respiratory failure -Evidence by an O2 sat of 85% on room air, respiratory rate of 30 on admission -Likely secondary to COPD exacerbation -Clinically improving  3. Hypertension -Continue losartan milligrams by mouth daily, atenolol 100 mg by mouth daily, hydrochlorothiazide 12.5 mg by mouth daily  4. Leukocytosis -White count increasing to 21,000, I suspect secondary to steroids will monitor    Code Status: Full Code Family Communication:  Disposition Plan: Anticipate discharge in the next 24 hours    HPI/Subjective: Patient is a pleasant 68 year old gentleman with a past medical history of tobacco abuse, chronic obstructive pulmonary disease, admitted to the medicine service on 11/07/2013. He presented with complaints of cough, shortness of breath, wheezing over the past 3-4 days prior to presentation. Initial chest x-ray did not reveal acute infiltrate. He was started on steroids for likely COPD exacerbation  Patient reports feeling better today however still becoming dyspneic with exertion. He reports not at his baseline  Objective: Filed Vitals:   11/08/13 1349  BP: 129/67  Pulse: 75  Temp: 98.1 F (36.7 C)  Resp: 20    Intake/Output Summary (Last 24 hours) at 11/08/13 1653 Last data filed at 11/08/13 1300  Gross per 24 hour  Intake   1760 ml  Output      0 ml  Net   1760 ml   Filed Weights   11/07/13 0448 11/07/13 0451 11/08/13 0549  Weight: 74.254 kg (163 lb 11.2 oz) 74.254 kg (163 lb  11.2 oz) 74.208 kg (163 lb 9.6 oz)    Exam:   General:  Patient is in mild distress, actively wheezing, becomes dyspneic with speaking  Cardiovascular: Regular rate rhythm normal S1-S2, no murmurs rubs or gallops  Respiratory: Diminished breath sounds bilaterally with improvement to bilateral expiratory wheezes  Abdomen: Soft nontender  Musculoskeletal: No edema  Data Reviewed: Basic Metabolic Panel:  Recent Labs Lab 11/06/13 2357 11/07/13 0705 11/08/13 0617  NA 138 139 137  K 3.8 3.7 4.1  CL 102 100 97  CO2 26 22 23   GLUCOSE 107* 219* 165*  BUN 14 11 16   CREATININE 0.77 0.71 0.78  CALCIUM 9.4 9.4 9.7   Liver Function Tests:  Recent Labs Lab 11/07/13 0705  AST 15  ALT 14  ALKPHOS 63  BILITOT 0.4  PROT 7.4  ALBUMIN 3.5   No results found for this basename: LIPASE, AMYLASE,  in the last 168 hours No results found for this basename: AMMONIA,  in the last 168 hours CBC:  Recent Labs Lab 11/06/13 2357 11/07/13 0705 11/08/13 0617  WBC 8.8 8.8 21.0*  NEUTROABS 6.1 8.5*  --   HGB 17.0 16.6 16.6  HCT 48.7 48.0 47.1  MCV 94.9 95.2 94.4  PLT 288 328 318   Cardiac Enzymes:  Recent Labs Lab 11/07/13 0705  TROPONINI <0.30   BNP (last 3 results)  Recent Labs  11/07/13 0705  PROBNP 56.2   CBG: No results found for this basename: GLUCAP,  in the last 168 hours  No results found for this or any previous visit (from the past 240 hour(s)).   Studies:  Dg Chest Port 1 View  11/07/2013   CLINICAL DATA:  Shortness of breath, cough, congestion  EXAM: PORTABLE CHEST - 1 VIEW  COMPARISON:  None.  FINDINGS: The heart size and mediastinal contours are within normal limits. Both lungs are clear. The visualized skeletal structures are unremarkable.  IMPRESSION: No active disease.   Electronically Signed   By: Kathreen Devoid   On: 11/07/2013 00:09    Scheduled Meds: . aspirin EC  81 mg Oral QODAY  . atenolol  100 mg Oral Daily  . benzonatate  100 mg Oral TID  .  budesonide (PULMICORT) nebulizer solution  0.25 mg Nebulization BID  . enoxaparin (LOVENOX) injection  40 mg Subcutaneous Daily  . losartan  100 mg Oral Daily   And  . hydrochlorothiazide  12.5 mg Oral Daily  . ipratropium-albuterol  3 mL Nebulization BID  . [START ON 11/09/2013] levofloxacin  750 mg Oral Daily  . methylPREDNISolone (SOLU-MEDROL) injection  60 mg Intravenous Q6H  . sodium chloride  3 mL Intravenous Q12H  . sodium chloride  3 mL Intravenous Q12H   Continuous Infusions:   Principal Problem:   Bronchospasm with bronchitis, acute Active Problems:   Hypertension   Bronchitis    Time spent: 25 min   [facility] Pager [phone]. If 7PM-7AM, please contact night-coverage at [email or web address], password [facility] 11/08/2013, 4:53 PM  LOS: 2 days

## 2013-11-09 DIAGNOSIS — I10 Essential (primary) hypertension: Secondary | ICD-10-CM | POA: Diagnosis not present

## 2013-11-09 DIAGNOSIS — J441 Chronic obstructive pulmonary disease with (acute) exacerbation: Secondary | ICD-10-CM | POA: Diagnosis present

## 2013-11-09 DIAGNOSIS — J9600 Acute respiratory failure, unspecified whether with hypoxia or hypercapnia: Secondary | ICD-10-CM | POA: Diagnosis not present

## 2013-11-09 DIAGNOSIS — J189 Pneumonia, unspecified organism: Secondary | ICD-10-CM | POA: Diagnosis present

## 2013-11-09 MED ORDER — PREDNISONE (PAK) 10 MG PO TABS: ORAL_TABLET | ORAL | Status: DC

## 2013-11-09 MED ORDER — ALBUTEROL SULFATE HFA 108 (90 BASE) MCG/ACT IN AERS: 2.0000 | INHALATION_SPRAY | Freq: Four times a day (QID) | RESPIRATORY_TRACT | Status: DC | PRN

## 2013-11-09 MED ORDER — FLUTICASONE-SALMETEROL 250-50 MCG/DOSE IN AEPB: 1.0000 | INHALATION_SPRAY | Freq: Two times a day (BID) | RESPIRATORY_TRACT | Status: DC

## 2013-11-09 MED ORDER — LEVOFLOXACIN 750 MG PO TABS: 750.0000 mg | ORAL_TABLET | Freq: Every day | ORAL | Status: DC

## 2013-11-09 NOTE — Progress Notes (Signed)
Patient discharged to home with wife.  IV removed prior to discharge; IV site clean, dry, and intact.  Discharge instructions, education, and medications discussed with patient and wife prior to discharge; both voiced understanding of information.

## 2013-11-09 NOTE — Discharge Summary (Signed)
Physician Discharge Summary  Gregory Abbott PTW:[id number] DOB: 07-25-1945 DOA: 11/06/2013  PCP: Ria Bush, MD  Admit date: 11/06/2013 Discharge date: 11/09/2013  Time spent: 35 minutes  Recommendations for Outpatient Follow-up:  1. Patient admitted for COPD exacerbation, please follow up on CBC on hospital follow up, had leukocytosis on his lab I suspect secondary to steroids.   Discharge Diagnoses:  Principal Problem:   COPD exacerbation Active Problems:   Acute respiratory failure   CAP (community acquired pneumonia)   Hypertension   Bronchitis   Discharge Condition: Stable  Diet recommendation: Heart Healthy  Filed Weights   11/07/13 0451 11/08/13 0549 11/09/13 0656  Weight: 74.254 kg (163 lb 11.2 oz) 74.208 kg (163 lb 9.6 oz) 73.165 kg (161 lb 4.8 oz)    History of present illness:  Gregory Abbott is a 68 y.o. male with history of tobacco abuse, hypertension and arthritis has been experiencing wheezing productive cough over the last 3-4 days. Denies any chest pain. Patient also get easily short winded exertion. Patient states that there have been some renovation work going on in the house. In the ER patient was found to be wheezing and was placed on nebulizer and steroid. Chest x-ray does not show anything acute. Patient has been admitted for possible bronchitis. Over the last few weeks patient states he has been noticing episodes of drenching sweats. Patient presently is afebrile. Patient otherwise denies any nausea vomiting abdominal pain or diarrhea.    Hospital Course:  Patient is a pleasant 67 year old gentleman with a past medical history of tobacco abuse, COPD who was admitted to the medicine service on 11/07/2013. He presented with complaints of increasing shortness of breath, cough, wheezing. He reported having contractors to remodel his home, been exposed to dust particles from the construction. He also stated that his wife was recently diagnosed with an upper  respiratory tract infection. Symptoms attributed to chronic obstructive pulmonary disease as he was started on IV steroids, empiric antibiotic therapy with Levaquin, scheduled duo nebs. Chest x-ray did not reveal active disease. 5 11/09/2013 he reported feeling much better, was ambulating around his room, did not require supplemental oxygen. He stated feeling well enough to be discharged. He was counseled extensively on tobacco cessation.     Discharge Exam: Filed Vitals:   11/09/13 0948  BP: 138/74  Pulse: 92  Temp: 97.7 F (36.5 C)  Resp: 20    General: Patient reports feeling much better, anxious to be discharged today. Cardiovascular: Regular rate and rhythm normal S1-S2 Respiratory: Diminished lung sounds, no wheezing or rhonchi noted, normal respiratory effort Abdomen: Soft nontender nondistended  Discharge Instructions You were cared for by a hospitalist during your hospital stay. If you have any questions about your discharge medications or the care you received while you were in the hospital after you are discharged, you can call the unit and asked to speak with the hospitalist on call if the hospitalist that took care of you is not available. Once you are discharged, your primary care physician will handle any further medical issues. Please note that NO REFILLS for any discharge medications will be authorized once you are discharged, as it is imperative that you return to your primary care physician (or establish a relationship with a primary care physician if you do not have one) for your aftercare needs so that they can reassess your need for medications and monitor your lab values.      Discharge Orders   Future Orders Complete By Expires  Call MD for:  difficulty breathing, headache or visual disturbances  As directed    Call MD for:  extreme fatigue  As directed    Call MD for:  persistant nausea and vomiting  As directed    Call MD for:  severe uncontrolled pain  As  directed    Call MD for:  temperature >100.4  As directed    Diet - low sodium heart healthy  As directed    Increase activity slowly  As directed        Medication List         albuterol 108 (90 BASE) MCG/ACT inhaler  Commonly known as:  PROVENTIL HFA;VENTOLIN HFA  Inhale 2 puffs into the lungs every 6 (six) hours as needed for wheezing or shortness of breath.     aspirin EC 81 MG tablet  Take 81 mg by mouth every other day.     atenolol 50 MG tablet  Commonly known as:  TENORMIN  Take 100 mg by mouth.     Fluticasone-Salmeterol 250-50 MCG/DOSE Aepb  Commonly known as:  ADVAIR DISKUS  Inhale 1 puff into the lungs 2 (two) times daily.     levofloxacin 750 MG tablet  Commonly known as:  LEVAQUIN  Take 1 tablet (750 mg total) by mouth daily.     loratadine 10 MG tablet  Commonly known as:  CLARITIN  Take 10 mg by mouth daily as needed for allergies.     losartan-hydrochlorothiazide 100-12.5 MG per tablet  Commonly known as:  HYZAAR  Take 1 tablet by mouth daily.     NYQUIL PO  Take 30 mLs by mouth 2 (two) times daily as needed (for congestion).     predniSONE 10 MG tablet  Commonly known as:  STERAPRED UNI-PAK  Take 6-5-4-3-2-1 tablets by mouth daily till gone.       Allergies  Allergen Reactions  . Penicillins Other (See Comments)    Unknown - as child. ?asthma  . Azithromycin Rash   Follow-up Information   Follow up with Ria Bush, MD In 1 week.   Specialty:  Family Medicine   Contact information:   [place] [address] [phone]        The results of significant diagnostics from this hospitalization (including imaging, microbiology, ancillary and laboratory) are listed below for reference.    Significant Diagnostic Studies: Dg Chest Port 1 View  11/07/2013   CLINICAL DATA:  Shortness of breath, cough, congestion  EXAM: PORTABLE CHEST - 1 VIEW  COMPARISON:  None.  FINDINGS: The heart size and mediastinal contours are  within normal limits. Both lungs are clear. The visualized skeletal structures are unremarkable.  IMPRESSION: No active disease.   Electronically Signed   By: Kathreen Devoid   On: 11/07/2013 00:09    Microbiology: No results found for this or any previous visit (from the past 240 hour(s)).   Labs: Basic Metabolic Panel:  Recent Labs Lab 11/06/13 2357 11/07/13 0705 11/08/13 0617  NA 138 139 137  K 3.8 3.7 4.1  CL 102 100 97  CO2 26 22 23   GLUCOSE 107* 219* 165*  BUN 14 11 16   CREATININE 0.77 0.71 0.78  CALCIUM 9.4 9.4 9.7   Liver Function Tests:  Recent Labs Lab 11/07/13 0705  AST 15  ALT 14  ALKPHOS 63  BILITOT 0.4  PROT 7.4  ALBUMIN 3.5   No results found for this basename: LIPASE, AMYLASE,  in the last 168  hours No results found for this basename: AMMONIA,  in the last 168 hours CBC:  Recent Labs Lab 11/06/13 2357 11/07/13 0705 11/08/13 0617  WBC 8.8 8.8 21.0*  NEUTROABS 6.1 8.5*  --   HGB 17.0 16.6 16.6  HCT 48.7 48.0 47.1  MCV 94.9 95.2 94.4  PLT 288 328 318   Cardiac Enzymes:  Recent Labs Lab 11/07/13 0705  TROPONINI <0.30   BNP: BNP (last 3 results)  Recent Labs  11/07/13 0705  PROBNP 56.2   CBG: No results found for this basename: GLUCAP,  in the last 168 hours     Signed:  Kelvin Abbott  [facility] 11/09/2013, 11:55 AM

## 2013-11-11 ENCOUNTER — Telehealth: Payer: Self-pay | Admitting: Family Medicine

## 2013-11-11 NOTE — Telephone Encounter (Signed)
Transitional Care Call attempted.  Left vm for pt to return call. 

## 2013-11-11 NOTE — Telephone Encounter (Signed)
Transitional Care Call.  Pt d/c'd from hospital on 11/09/13.  D/C dx: COPD exacerbation, Acute respiratory failure, CAP (community acquired pneumonia), Hypertension, and Bronchitis.  Spoke with pt.  He says he is "feeling ok and doing well".  Denies SOB or wheezing.  Denies pain.  Afebrile.  Pt is able to perform ADLs independently.  Denies questions regarding medication list or d/c instructions.  Denies questions for Dr. Danise Mina.  He was very appreciative for the call.  Follow up appt previously scheduled for 11/14/13.

## 2013-11-14 ENCOUNTER — Encounter: Payer: Self-pay | Admitting: Family Medicine

## 2013-11-14 ENCOUNTER — Ambulatory Visit (INDEPENDENT_AMBULATORY_CARE_PROVIDER_SITE_OTHER): Payer: BC Managed Care – PPO | Admitting: Family Medicine

## 2013-11-14 VITALS — BP 138/82 | HR 66 | Temp 97.9°F | Wt 167.2 lb

## 2013-11-14 DIAGNOSIS — R7309 Other abnormal glucose: Secondary | ICD-10-CM | POA: Diagnosis not present

## 2013-11-14 DIAGNOSIS — Z23 Encounter for immunization: Secondary | ICD-10-CM | POA: Diagnosis not present

## 2013-11-14 DIAGNOSIS — J441 Chronic obstructive pulmonary disease with (acute) exacerbation: Secondary | ICD-10-CM | POA: Diagnosis not present

## 2013-11-14 DIAGNOSIS — D72829 Elevated white blood cell count, unspecified: Secondary | ICD-10-CM

## 2013-11-14 DIAGNOSIS — Z87891 Personal history of nicotine dependence: Secondary | ICD-10-CM

## 2013-11-14 DIAGNOSIS — T380X5A Adverse effect of glucocorticoids and synthetic analogues, initial encounter: Secondary | ICD-10-CM

## 2013-11-14 DIAGNOSIS — R739 Hyperglycemia, unspecified: Secondary | ICD-10-CM

## 2013-11-14 LAB — CBC
HCT: 51.4 % (ref 39.0–52.0)
HEMOGLOBIN: 17.1 g/dL — AB (ref 13.0–17.0)
MCHC: 33.2 g/dL (ref 30.0–36.0)
MCV: 98.9 fl (ref 78.0–100.0)
PLATELETS: 342 10*3/uL (ref 150.0–400.0)
RBC: 5.19 Mil/uL (ref 4.22–5.81)
RDW: 14.4 % (ref 11.5–15.5)
WBC: 8.5 10*3/uL (ref 4.0–10.5)

## 2013-11-14 LAB — GLUCOSE, POCT (MANUAL RESULT ENTRY): POC GLUCOSE: 87 mg/dL (ref 70–99)

## 2013-11-14 NOTE — Addendum Note (Signed)
Addended by: Royann Shivers A on: 11/14/2013 10:17 AM   Modules accepted: Orders

## 2013-11-14 NOTE — Progress Notes (Signed)
BP 138/82  Pulse 66  Temp(Src) 97.9 F (36.6 C) (Oral)  Wt 167 lb 4 oz (75.864 kg)  SpO2 98%   CC: hosp f/u  Subjective:    Patient ID: Gregory Abbott, male    DOB: 12-31-1945, 68 y.o.   MRN: [id number]  HPI: Gregory Abbott is a 68 y.o. male presenting on 11/14/2013 for Gregory Abbott presents today for hospital f/u visit.  Records reviewed.  Recent admission for COPD exacerbation and acute res[p distress with hypoxia to 85%, treated with levaquin 750mg  course and prednisone taper.  Feels trigger was increased dust and chemical exposure at home 2/2 house renovations.  Rec f/u CBC upon discharge for leukocytosis presumed from steroids. Noted hyperglycemia presumed from prednisone.  Staying congested, treating with nyquil. Lab Results  Component Value Date   WBC 21.0* 11/08/2013   Stopped smoking 1+ wk now. Continued nicotine craving. Has patches but did not use. Wants to do this cold [place].  Thinks has received PNA shot several years ago - due to splenectomy.  Thinks due for another one.  Will give prevnar today.  PORTABLE CHEST - 1 VIEW  COMPARISON: None.  FINDINGS:  The heart size and mediastinal contours are within normal limits.  Both lungs are clear. The visualized skeletal structures are  unremarkable.  IMPRESSION:  No active disease.  Electronically Signed  By: Kathreen Devoid  On: 11/07/2013 00:09   Admit date: 11/06/2013  Discharge date: 11/09/2013  F/u phone call:  11/11/2013 Recommendations for Outpatient Follow-up:  1. Patient admitted for COPD exacerbation, please follow up on CBC on hospital follow up, had leukocytosis on his lab I suspect secondary to steroids.  Discharge Diagnoses:  Principal Problem:  COPD exacerbation  Active Problems:  Acute respiratory failure  CAP (community acquired pneumonia)  Hypertension  Bronchitis History of present illness:  Gregory Abbott is a 68 y.o. male with history of tobacco abuse, hypertension and arthritis has been  experiencing wheezing productive cough over the last 3-4 days. Denies any chest pain. Patient also get easily short winded exertion. Patient states that there have been some renovation work going on in the house. In the ER patient was found to be wheezing and was placed on nebulizer and steroid. Chest x-ray does not show anything acute. Patient has been admitted for possible bronchitis. Over the last few weeks patient states he has been noticing episodes of drenching sweats. Patient presently is afebrile. Patient otherwise denies any nausea vomiting abdominal pain or diarrhea.  Hospital Course:  Patient is a pleasant 68 year old gentleman with a past medical history of tobacco abuse, COPD who was admitted to the medicine service on 11/07/2013. He presented with complaints of increasing shortness of breath, cough, wheezing. He reported having contractors to remodel his home, been exposed to dust particles from the construction. He also stated that his wife was recently diagnosed with an upper respiratory tract infection. Symptoms attributed to chronic obstructive pulmonary disease as he was started on IV steroids, empiric antibiotic therapy with Levaquin, scheduled duo nebs. Chest x-ray did not reveal active disease. 11/09/2013 he reported feeling much better, was ambulating around his room, did not require supplemental oxygen. He stated feeling well enough to be discharged. He was counseled extensively on tobacco cessation.   Relevant past medical, surgical, family and social history reviewed and updated as indicated.  Allergies and medications reviewed and updated. Current Outpatient Prescriptions on File Prior to Visit  Medication Sig  . aspirin EC 81 MG tablet Take 81  mg by mouth every other day.  [name] atenolol (TENORMIN) 50 MG tablet Take 100 mg by mouth.  . losartan-hydrochlorothiazide (HYZAAR) 100-12.5 MG per tablet Take 1 tablet by mouth daily.  [name] albuterol (PROVENTIL HFA;VENTOLIN HFA) 108 (90 BASE)  MCG/ACT inhaler Inhale 2 puffs into the lungs every 6 (six) hours as needed for wheezing or shortness of breath.  . Fluticasone-Salmeterol (ADVAIR DISKUS) 250-50 MCG/DOSE AEPB Inhale 1 puff into the lungs 2 (two) times daily.  [name] loratadine (CLARITIN) 10 MG tablet Take 10 mg by mouth daily as needed for allergies.  . Pseudoeph-Doxylamine-DM-APAP (NYQUIL PO) Take 30 mLs by mouth 2 (two) times daily as needed (for congestion).   No current facility-administered medications on file prior to visit.    Review of Systems Per HPI unless specifically indicated above    Objective:    BP 138/82  Pulse 66  Temp(Src) 97.9 F (36.6 C) (Oral)  Wt 167 lb 4 oz (75.864 kg)  SpO2 98%  Physical Exam  Nursing note and vitals reviewed. Constitutional: He appears well-developed and well-nourished. No distress.  HENT:  Mouth/Throat: Oropharynx is clear and moist. No oropharyngeal exudate.  Cardiovascular: Normal rate, regular rhythm, normal heart sounds and intact distal pulses.   No murmur heard. Pulmonary/Chest: Effort normal and breath sounds normal. No respiratory distress. He has no wheezes. He has no rales.  Slightly coarse  Musculoskeletal: He exhibits no edema.  Skin: Skin is warm and dry. No rash noted.       Assessment & Plan:   Problem List Items Addressed This Visit   Ex-smoker     Congratulated and encouraged continued cessation.    COPD exacerbation - Primary     Recent hospitalization for COPD exacerbation in 89 PY hx smoker, now ex smoker for last week. ?CAP as well although CXR was normal. Has completed levaquin course and prednisone taper, overall feeling well. Will provide with prevnar today. Will have him return in 2-3 months when breathing back to baseline for spirometry. rec continued advair bid, albuterol prn. Recent steroid induced hyperglycemia - check CBG today. Recent leukocytosis (presumed steroid induced) - check WBC today.     Other Visit Diagnoses    Leukocytosis, unspecified        Relevant Orders       WBC        Follow up plan: Return in about 3 months (around 02/14/2014), or as needed, for spirometry.

## 2013-11-14 NOTE — Addendum Note (Signed)
Addended by: Ellamae Sia on: 11/14/2013 11:27 AM   Modules accepted: Orders

## 2013-11-14 NOTE — Assessment & Plan Note (Signed)
Congratulated and encouraged continued cessation.

## 2013-11-14 NOTE — Patient Instructions (Addendum)
Try plain mucinex or immediate release guaifenesin with lots of water to help mobilize mucous. Prevnar today (pneumonia shot). Check sugar today (cbg) and blood work. I think you're doing well. Congratulations on quitting smoking!! I'd like you to come in over the next few months for spirometry.

## 2013-11-14 NOTE — Progress Notes (Signed)
Pre visit review using our clinic review tool, if applicable. No additional management support is needed unless otherwise documented below in the visit note. 

## 2013-11-14 NOTE — Assessment & Plan Note (Addendum)
Recent hospitalization for COPD exacerbation in 70 PY hx smoker, now ex smoker for last week. ?CAP as well although CXR was normal. Has completed levaquin course and prednisone taper, overall feeling well. Will provide with prevnar today. Will have him return in 2-3 months when breathing back to baseline for spirometry. rec continued advair bid, albuterol prn. Recent steroid induced hyperglycemia - check CBG today. Recent leukocytosis (presumed steroid induced) - check WBC today.

## 2013-11-15 ENCOUNTER — Telehealth: Payer: Self-pay | Admitting: Family Medicine

## 2013-11-15 ENCOUNTER — Encounter: Payer: Self-pay | Admitting: *Deleted

## 2013-11-15 NOTE — Telephone Encounter (Signed)
Relevant patient education assigned to patient using Emmi. ° °

## 2013-12-08 DIAGNOSIS — Z87891 Personal history of nicotine dependence: Secondary | ICD-10-CM | POA: Diagnosis not present

## 2013-12-08 DIAGNOSIS — J441 Chronic obstructive pulmonary disease with (acute) exacerbation: Secondary | ICD-10-CM

## 2013-12-08 DIAGNOSIS — D72829 Elevated white blood cell count, unspecified: Secondary | ICD-10-CM | POA: Diagnosis not present

## 2013-12-08 DIAGNOSIS — Z23 Encounter for immunization: Secondary | ICD-10-CM

## 2013-12-08 DIAGNOSIS — R7309 Other abnormal glucose: Secondary | ICD-10-CM | POA: Diagnosis not present

## 2013-12-10 ENCOUNTER — Ambulatory Visit (INDEPENDENT_AMBULATORY_CARE_PROVIDER_SITE_OTHER): Payer: Medicare Other | Admitting: Family Medicine

## 2013-12-10 ENCOUNTER — Encounter: Payer: Self-pay | Admitting: Family Medicine

## 2013-12-10 VITALS — BP 148/64 | HR 72 | Temp 98.0°F | Wt 170.8 lb

## 2013-12-10 DIAGNOSIS — L8990 Pressure ulcer of unspecified site, unspecified stage: Secondary | ICD-10-CM | POA: Diagnosis not present

## 2013-12-10 DIAGNOSIS — L89309 Pressure ulcer of unspecified buttock, unspecified stage: Secondary | ICD-10-CM

## 2013-12-10 DIAGNOSIS — J449 Chronic obstructive pulmonary disease, unspecified: Secondary | ICD-10-CM | POA: Diagnosis not present

## 2013-12-10 MED ORDER — ALBUTEROL SULFATE (2.5 MG/3ML) 0.083% IN NEBU
2.5000 mg | INHALATION_SOLUTION | Freq: Once | RESPIRATORY_TRACT | Status: AC
  Administered 2013-12-10: 2.5 mg via RESPIRATORY_TRACT

## 2013-12-10 NOTE — Patient Instructions (Signed)
Breathing test showing moderate COPD - if you notice worsening trouble breathing or more frequent respiratory infections I recommend starting a daily medicine. Let me know if you'd like to start this. For now we will watch breathing off medicines.  Ok to take albuterol inhaler as needed. Best think you can do is quit smoking - which you've already done. Stay as active as you can. For bottom ulcer - stop peroxide. Use vaseline gauze for next few days then either A&D ointment or plain vaseline with dressing change daily. Let me know if not improving with this treatment.  Chronic Obstructive Pulmonary Disease Chronic obstructive pulmonary disease (COPD) is a common lung condition in which airflow from the lungs is limited. COPD is a general term that can be used to describe many different lung problems that limit airflow, including both chronic bronchitis and emphysema. If you have COPD, your lung function will probably never return to normal, but there are measures you can take to improve lung function and make yourself feel better.  CAUSES   Smoking (common).   Exposure to secondhand smoke.   Genetic problems.  Chronic inflammatory lung diseases or recurrent infections. SYMPTOMS   Shortness of breath, especially with physical activity.   Deep, persistent (chronic) cough with a large amount of thick mucus.   Wheezing.   Rapid breaths (tachypnea).   Gray or bluish discoloration (cyanosis) of the skin, especially in fingers, toes, or lips.   Fatigue.   Weight loss.   Frequent infections or episodes when breathing symptoms become much worse (exacerbations).   Chest tightness. DIAGNOSIS  Your healthcare provider will take a medical history and perform a physical examination to make the initial diagnosis. Additional tests for COPD may include:   Lung (pulmonary) function tests.  Chest X-ray.  CT scan.  Blood tests. TREATMENT  Treatment available to help you feel  better when you have COPD include:   Inhaler and nebulizer medicines. These help manage the symptoms of COPD and make your breathing more comfortable  Supplemental oxygen. Supplemental oxygen is only helpful if you have a low oxygen level in your blood.   Exercise and physical activity. These are beneficial for nearly all people with COPD. Some people may also benefit from a pulmonary rehabilitation program. HOME CARE INSTRUCTIONS   Take all medicines (inhaled or pills) as directed by your health care provider.  Only take over-the-counter or prescription medicines for pain, fever, or discomfort as directed by your health care provider.   Avoid over-the-counter medicines or cough syrups that dry up your airway (such as antihistamines) and slow down the elimination of secretions unless instructed otherwise by your healthcare provider.   If you are a smoker, the most important thing that you can do is stop smoking. Continuing to smoke will cause further lung damage and breathing trouble. Ask your health care provider for help with quitting smoking. He or she can direct you to community resources or hospitals that provide support.  Avoid exposure to irritants such as smoke, chemicals, and fumes that aggravate your breathing.  Use oxygen therapy and pulmonary rehabilitation if directed by your health care provider. If you require home oxygen therapy, ask your healthcare provider whether you should purchase a pulse oximeter to measure your oxygen level at home.   Avoid contact with individuals who have a contagious illness.  Avoid extreme temperature and humidity changes.  Eat healthy foods. Eating smaller, more frequent meals and resting before meals may help you maintain your strength.  Stay active, but balance activity with periods of rest. Exercise and physical activity will help you maintain your ability to do things you want to do.  Preventing infection and hospitalization is very  important when you have COPD. Make sure to receive all the vaccines your health care provider recommends, especially the pneumococcal and influenza vaccines. Ask your healthcare provider whether you need a pneumonia vaccine.  Learn and use relaxation techniques to manage stress.  Learn and use controlled breathing techniques as directed by your health care provider. Controlled breathing techniques include:   Pursed lip breathing. Start by breathing in (inhaling) through your nose for 1 second. Then, purse your lips as if you were going to whistle and breathe out (exhale) through the pursed lips for 2 seconds.   Diaphragmatic breathing. Start by putting one hand on your abdomen just above your waist. Inhale slowly through your nose. The hand on your abdomen should move out. Then purse your lips and exhale slowly. You should be able to feel the hand on your abdomen moving in as you exhale.   Learn and use controlled coughing to clear mucus from your lungs. Controlled coughing is a series of short, progressive coughs. The steps of controlled coughing are:  1. Lean your head slightly forward.  2. Breathe in deeply using diaphragmatic breathing.  3. Try to hold your breath for 3 seconds.  4. Keep your mouth slightly open while coughing twice.  5. Spit any mucus out into a tissue.  6. Rest and repeat the steps once or twice as needed. SEEK MEDICAL CARE IF:   You are coughing up more mucus than usual.   There is a change in the color or thickness of your mucus.   Your breathing is more labored than usual.   Your breathing is faster than usual.  SEEK IMMEDIATE MEDICAL CARE IF:   You have shortness of breath while you are resting.   You have shortness of breath that prevents you from:  Being able to talk.   Performing your usual physical activities.   You have chest pain lasting longer than 5 minutes.   Your skin color is more cyanotic than usual.  You measure low  oxygen saturations for longer than 5 minutes with a pulse oximeter. MAKE SURE YOU:   Understand these instructions.  Will watch your condition.  Will get help right away if you are not doing well or get worse. Document Released: 03/30/2005 Document Revised: 04/10/2013 Document Reviewed: 02/14/2013 [facility] Patient Information 2014 [place], [place].

## 2013-12-10 NOTE — Assessment & Plan Note (Signed)
Reviewed dx - moderate stage II COPD. Educational handout provided. Pt has albuterol inhaler at home. Discussed rec of regular long acting bronchodilator - pt prefers to avoid for now. Has already quit smoking Red flags to return discussed, advised to notify me if any dyspnea on exertion or repeat respiratory infections.

## 2013-12-10 NOTE — Progress Notes (Signed)
BP 148/64  Pulse 72  Temp(Src) 98 F (36.7 C) (Oral)  Wt 170 lb 12 oz (77.452 kg)   CC: spirometry f/u  Subjective:    Patient ID: Gregory Abbott, male    DOB: 09-22-1945, 68 y.o.   MRN: [id number]  HPI: Gregory Abbott is a 68 y.o. male presenting on 12/10/2013 for Follow-up   Recent hospitalization for COPD exacerbation and possible CAP with hypoxia. Treated with levaquin 750mg  and prednisone course. Trigger was increased dust and chemical exposure at home 2/2 house renovations.  Has recovered well, denies current dyspnea, cough or wheezing.  Not using albuterol. Also stopped advair after he started feeling better. Desires to stay off breathing medication for now.  Remains off cigarettes (quit 11/06/2013)  Also would like to discuss poorly healing wound on buttock present for last 2-3 weeks. Has started using A&D ointment for last 2 days.  Relevant past medical, surgical, family and social history reviewed and updated as indicated.  Allergies and medications reviewed and updated. Current Outpatient Prescriptions on File Prior to Visit  Medication Sig  . aspirin EC 81 MG tablet Take 81 mg by mouth every other day.  [name] atenolol (TENORMIN) 50 MG tablet Take 100 mg by mouth.  . losartan-hydrochlorothiazide (HYZAAR) 100-12.5 MG per tablet Take 1 tablet by mouth daily.  [name] albuterol (PROVENTIL HFA;VENTOLIN HFA) 108 (90 BASE) MCG/ACT inhaler Inhale 2 puffs into the lungs every 6 (six) hours as needed for wheezing or shortness of breath.  . loratadine (CLARITIN) 10 MG tablet Take 10 mg by mouth daily as needed for allergies.   No current facility-administered medications on file prior to visit.    Review of Systems Per HPI unless specifically indicated above    Objective:    BP 148/64  Pulse 72  Temp(Src) 98 F (36.7 C) (Oral)  Wt 170 lb 12 oz (77.452 kg)  Physical Exam  Nursing note and vitals reviewed. Constitutional: He appears well-developed and well-nourished. No distress.    HENT:  Mouth/Throat: Oropharynx is clear and moist. No oropharyngeal exudate.  Cardiovascular: Normal rate, regular rhythm, normal heart sounds and intact distal pulses.   No murmur heard. Pulmonary/Chest: Effort normal and breath sounds normal. No respiratory distress. He has no wheezes. He has no rales.  Mildly coarse  Musculoskeletal: He exhibits no edema.  Skin: Skin is warm and dry. Lesion noted.     2 small ulcers, larger inferior (about 1cm diameter) with clean bases, no surrounding erythema.       Assessment & Plan:   Problem List Items Addressed This Visit   Decubitus ulcer of buttock     New - anticipate related to excessive use of peroxide. No evidence of infection currently. Rec treat with vaseline gauze and then plain vaseline or A&D oint daily dressing changes. Pt agrees. Red flags to return discussed.    COPD (chronic obstructive pulmonary disease) - Primary     Reviewed dx - moderate stage II COPD. Educational handout provided. Pt has albuterol inhaler at home. Discussed rec of regular long acting bronchodilator - pt prefers to avoid for now. Has already quit smoking Red flags to return discussed, advised to notify me if any dyspnea on exertion or repeat respiratory infections.    Relevant Medications      albuterol (PROVENTIL) (2.5 MG/3ML) 0.083% nebulizer solution 2.5 mg (Completed)   Other Relevant Orders      Spirometry with Graph (Completed)       Follow up plan: Return if symptoms  worsen or fail to improve.

## 2013-12-10 NOTE — Progress Notes (Signed)
Pre visit review using our clinic review tool, if applicable. No additional management support is needed unless otherwise documented below in the visit note. 

## 2013-12-10 NOTE — Assessment & Plan Note (Signed)
New - anticipate related to excessive use of peroxide. No evidence of infection currently. Rec treat with vaseline gauze and then plain vaseline or A&D oint daily dressing changes. Pt agrees. Red flags to return discussed.

## 2014-01-24 ENCOUNTER — Other Ambulatory Visit: Payer: Self-pay | Admitting: Family Medicine

## 2014-02-17 ENCOUNTER — Telehealth: Payer: Self-pay | Admitting: Family Medicine

## 2014-02-17 DIAGNOSIS — R21 Rash and other nonspecific skin eruption: Secondary | ICD-10-CM

## 2014-02-17 NOTE — Telephone Encounter (Signed)
plz notify patient - does not need f/u visit here if same rash as when seen here 09/2013. however if concern regarding wound on bottom recommend eval here. For diffuse itchy skin rash from March, would offer prednisone oral course and have placed referral to dermatology May cancel 9:45am appt if desired.  No results found for this basename: HGBA1C

## 2014-02-17 NOTE — Telephone Encounter (Signed)
Pt would like a referral to a dermatologist in reference to the itching you treated him for in the past.  He states it's not getting any better. Can you place a referral or does he need an appmt w/you first? Thank you.

## 2014-02-18 ENCOUNTER — Ambulatory Visit (INDEPENDENT_AMBULATORY_CARE_PROVIDER_SITE_OTHER): Payer: Medicare Other | Admitting: Family Medicine

## 2014-02-18 ENCOUNTER — Encounter: Payer: Self-pay | Admitting: Family Medicine

## 2014-02-18 VITALS — BP 130/76 | HR 72 | Temp 97.8°F | Wt 174.8 lb

## 2014-02-18 DIAGNOSIS — R21 Rash and other nonspecific skin eruption: Secondary | ICD-10-CM

## 2014-02-18 MED ORDER — CLOBETASOL PROPIONATE 0.05 % EX CREA: 1.0000 "application " | TOPICAL_CREAM | Freq: Two times a day (BID) | CUTANEOUS | Status: DC

## 2014-02-18 MED ORDER — CLOTRIMAZOLE-BETAMETHASONE 1-0.05 % EX CREA: 1.0000 "application " | TOPICAL_CREAM | Freq: Two times a day (BID) | CUTANEOUS | Status: DC

## 2014-02-18 NOTE — Progress Notes (Signed)
Pre visit review using our clinic review tool, if applicable. No additional management support is needed unless otherwise documented below in the visit note. 

## 2014-02-18 NOTE — Patient Instructions (Addendum)
For hands - use stronger steroid cream sent to pharmacy. For left foot - use combination steroid and antifungal sent to pharmacy. Avoid hot water showers, avoid peroxide and alcohol based cleaners, use mild fragrance free hand soaps during the day. Let me know if not better with this. Good to see you today, call us with questions. If not better with this, call us for referral to dermatology.  Hand Dermatitis Hand dermatitis (dyshidrotic eczema) is a skin condition in which small, itchy, raised dots or fluid-filled blisters form over the palms of the hands. Outbreaks of hand dermatitis can last 3 to 4 weeks. CAUSES  The cause of hand dermatitis is unknown. However, it occurs most often in patients with a history of allergies such as:  Hay fever.  Allergic asthma.  Allergies to latex. Chemical exposure, injuries, and environmental irritants can make hand dermatitis worse. Washing your hands too frequently can remove natural oils, which can dry out the skin and contribute to outbreaks of hand dermatitis. SYMPTOMS  The most common symptom of hand dermatitis is intense itching. Cracks or grooves (fissures) on the fingers can also develop. Affected areas can be painful, especially areas where large blisters have formed. DIAGNOSIS Your caregiver can usually tell what the problem is by doing a physical exam. PREVENTION  Avoid excessive hand washing.  Avoid the use of harsh chemicals.  Wear protective gloves when handling products that can irritate your skin. TREATMENT  Steroid creams and ointments, such as over-the-counter 1% hydrocortisone cream, can reduce inflammation and improve moisture retention. These should be applied at least 2 to 4 times per day. Your caregiver may ask you to use a stronger prescription steroid cream to help speed the healing of blistered and cracked skin. In severe cases, oral steroid medicine may be needed. If you have an infection, antibiotics may be needed. Your  caregiver may also prescribe antihistamines. These medicines help reduce itching. HOME CARE INSTRUCTIONS  Only take over-the-counter or prescription medicines as directed by your caregiver.  You may use wet or cold compresses. This can help:  Alleviate itching.  Increase the effectiveness of topical creams.  Minimize blisters. SEEK MEDICAL CARE IF:  The rash is not better after 1 week of treatment.  Signs of infection develop, such as redness, tenderness, or yellowish-white fluid (pus).  The rash is spreading. Document Released: 06/20/2005 Document Revised: 09/12/2011 Document Reviewed: 11/17/2010 [facility] Patient Information 2015 [place], [place]. This information is not intended to replace advice given to you by your health care provider. Make sure you discuss any questions you have with your health care provider.

## 2014-02-18 NOTE — Telephone Encounter (Signed)
Didn't get message in time. Sorry.

## 2014-02-18 NOTE — Assessment & Plan Note (Addendum)
Anticipate 2 issues: 1. Hand dermatitis consistent with dishydrotic eczema - treat with stronger topical steroid sent to pharmacy. 2. Left foot rash with ulcer - ?severe tinea pedis - treat with lotrisone. Update if not better for referral to dermatology. Pt agrees with plan. Discussed routine skin care measures

## 2014-02-18 NOTE — Progress Notes (Signed)
BP 130/76  Pulse 72  Temp(Src) 97.8 F (36.6 C) (Oral)  Wt 174 lb 12 oz (79.266 kg)   CC: itchy rash  Subjective:    Patient ID: Gregory Abbott, male    DOB: [birth year], 68 y.o.   MRN: [id number]  HPI: Gregory Abbott is a 68 y.o. male presenting on 02/18/2014 for Pruritis and Rash   Persistent itchy rash that flared up 1 month ago. H/o similar rash during his late 75s. Flare returned after zpack allergic reaction. Has tried lotrimin and cortisone which helps some, but then rash returns again. Hot water worsens itch. Has been very regular with handwashing with antibacterial soap throughout the day. Has itch that becomes a rash. Tends to be worse in summer months.  Now has small ulcer that developed 1 week ago on medial L midfoot. Self treating with cortisone and A&D ointment. This seems to be helping some. Has tried some lotrimin on foot as well. H/o athlete's foot in past.   Evaluated for similar rash 09/2013 and treated with topical steroid TCI cream which helped but hasn't resolved issue. Has been using peroxide as well.  Hasn't found any foods to precipitate rash. No new medicines.  No new lotions, creams, detergents, soaps, shampoos.   Uses dove soap. [place] and hot water breaks him out.   Has tried calomine lotion and moisturizing cream and cortisone cream. Also tried gold bond.   H/o sensitive skin - to strong deodorants.  H/o childhood asthma and some persistent allergies, + h/o eczema.   No fevers/chills, joint pains, nausea, recent viral illness.  Rash spares face, trunk. Started around neck, traveling down arms.   Relevant past medical, surgical, family and social history reviewed and updated as indicated.  Allergies and medications reviewed and updated. Current Outpatient Prescriptions on File Prior to Visit  Medication Sig  . aspirin EC 81 MG tablet Take 81 mg by mouth every other day.  [name] atenolol (TENORMIN) 50 MG tablet TAKE 1 TABLET BY MOUTH TWICE A DAY  .  losartan-hydrochlorothiazide (HYZAAR) 100-12.5 MG per tablet Take 1 tablet by mouth daily.  [name] albuterol (PROVENTIL HFA;VENTOLIN HFA) 108 (90 BASE) MCG/ACT inhaler Inhale 2 puffs into the lungs every 6 (six) hours as needed for wheezing or shortness of breath.  . loratadine (CLARITIN) 10 MG tablet Take 10 mg by mouth daily as needed for allergies.   No current facility-administered medications on file prior to visit.    Review of Systems Per HPI unless specifically indicated above    Objective:    BP 130/76  Pulse 72  Temp(Src) 97.8 F (36.6 C) (Oral)  Wt 174 lb 12 oz (79.266 kg)  Physical Exam  Nursing note and vitals reviewed. Constitutional: He appears well-developed and well-nourished. No distress.  Skin: Skin is warm and dry. Rash noted.  Pruritic dry patches with papules bilateral dorsal hands L medial midfoot with ~10cm patch of scaly skin with papules, centrally is ~2cm shallow ulcer with clean bases   Results for orders placed in visit on 11/14/13  CBC      Result Value Ref Range   WBC 8.5  4.0 - 10.5 K/uL   RBC 5.19  4.22 - 5.81 Mil/uL   Platelets 342.0  150.0 - 400.0 K/uL   Hemoglobin 17.1 (*) 13.0 - 17.0 g/dL   HCT 51.4  39.0 - 52.0 %   MCV 98.9  78.0 - 100.0 fl   MCHC 33.2  30.0 - 36.0 g/dL   RDW  14.4  11.5 - 15.5 %  GLUCOSE, POCT (MANUAL RESULT ENTRY)      Result Value Ref Range   POC Glucose 87  70 - 99 mg/dl      Assessment & Plan:   Problem List Items Addressed This Visit   Skin rash - Primary     Anticipate 2 issues: 1. Hand dermatitis consistent with dishydrotic eczema - treat with stronger topical steroid sent to pharmacy. 2. Left foot rash with ulcer - ?severe tinea pedis - treat with lotrisone. Update if not better for referral to dermatology. Pt agrees with plan. Discussed routine skin care measures        Follow up plan: Return if symptoms worsen or fail to improve.

## 2014-03-06 ENCOUNTER — Other Ambulatory Visit: Payer: Self-pay | Admitting: Family Medicine

## 2014-03-26 DIAGNOSIS — L259 Unspecified contact dermatitis, unspecified cause: Secondary | ICD-10-CM | POA: Diagnosis not present

## 2014-03-30 ENCOUNTER — Encounter: Payer: Self-pay | Admitting: Family Medicine

## 2014-04-14 DIAGNOSIS — Z23 Encounter for immunization: Secondary | ICD-10-CM | POA: Diagnosis not present

## 2014-06-10 ENCOUNTER — Other Ambulatory Visit: Payer: Self-pay | Admitting: Family Medicine

## 2014-07-18 ENCOUNTER — Other Ambulatory Visit: Payer: Self-pay | Admitting: Family Medicine

## 2014-09-03 ENCOUNTER — Other Ambulatory Visit: Payer: Self-pay | Admitting: Family Medicine

## 2014-09-25 ENCOUNTER — Encounter: Payer: Self-pay | Admitting: Family Medicine

## 2014-09-25 ENCOUNTER — Ambulatory Visit (INDEPENDENT_AMBULATORY_CARE_PROVIDER_SITE_OTHER): Payer: Medicare Other | Admitting: Family Medicine

## 2014-09-25 VITALS — BP 154/82 | HR 68 | Temp 98.1°F | Resp 18 | Wt 196.0 lb

## 2014-09-25 DIAGNOSIS — J209 Acute bronchitis, unspecified: Secondary | ICD-10-CM

## 2014-09-25 DIAGNOSIS — J449 Chronic obstructive pulmonary disease, unspecified: Secondary | ICD-10-CM

## 2014-09-25 MED ORDER — DOXYCYCLINE HYCLATE 100 MG PO CAPS: 100.0000 mg | ORAL_CAPSULE | Freq: Two times a day (BID) | ORAL | Status: DC

## 2014-09-25 MED ORDER — FORMOTEROL FUMARATE 12 MCG IN CAPS: 12.0000 ug | ORAL_CAPSULE | Freq: Two times a day (BID) | RESPIRATORY_TRACT | Status: DC

## 2014-09-25 NOTE — Assessment & Plan Note (Signed)
Anticipate bronchitis flare in h/o COPD - treat with doxycycline antibiotic course. Push fluids and rest. Albuterol as needed for shortness of breath.  Update if not improving with treatment.

## 2014-09-25 NOTE — Assessment & Plan Note (Signed)
Noticing DOE - will start foradil - sent to pharmacy.

## 2014-09-25 NOTE — Progress Notes (Signed)
BP 154/82 mmHg  Pulse 68  Temp(Src) 98.1 F (36.7 C) (Oral)  Resp 18  Wt 196 lb (88.905 kg)  SpO2 97%   CC: cough  Subjective:    Patient ID: Gregory Abbott, male    DOB: 02/14/1946, 69 y.o.   MRN: [id number]  HPI: Gregory Abbott is a 69 y.o. male presenting on 09/25/2014 for Cough   1 wk h/o cold sxs - congestion, coughing, with mild mucous production, body aches, chills, occasional L ribcage pain. ST and PNDrainage. Chest > head congestion.   Self treated with theraflu, dayquil  Did receive flu shot this year.  Wife sick recently as well. No smokers at home. Pt quit smoking almost 1 yr ago.  Known COPD recently diagnosed after hospitalization with COPD exac and PNA - Mod stage II. Notices dyspnea with exertion. Interested in trial of foradil.   Bad desquamating rash to zpack  Also with PCN allergy.  Relevant past medical, surgical, family and social history reviewed and updated as indicated. Interim medical history since our last visit reviewed. Allergies and medications reviewed and updated. Current Outpatient Prescriptions on File Prior to Visit  Medication Sig  . albuterol (PROVENTIL HFA;VENTOLIN HFA) 108 (90 BASE) MCG/ACT inhaler Inhale 2 puffs into the lungs every 6 (six) hours as needed for wheezing or shortness of breath.  [name] aspirin EC 81 MG tablet Take 81 mg by mouth every other day.  [name] atenolol (TENORMIN) 50 MG tablet TAKE 1 TABLET BY MOUTH TWICE A DAY  . clobetasol cream (TEMOVATE) 4.09 % Apply 1 application topically 2 (two) times daily. Apply to AA for hands, no more than 2 wks at a time  . clotrimazole-betamethasone (LOTRISONE) cream Apply 1 application topically 2 (two) times daily. To foot. No more than 3-4 weeks at a time.  [name] loratadine (CLARITIN) 10 MG tablet Take 10 mg by mouth daily as needed for allergies.  [name] losartan-hydrochlorothiazide (HYZAAR) 100-12.5 MG per tablet TAKE ONE TABLET DAILY **MUST HAVE PHYSICAL FOR FURTHER REFILLS**   No current  facility-administered medications on file prior to visit.    Review of Systems Per HPI unless specifically indicated above     Objective:    BP 154/82 mmHg  Pulse 68  Temp(Src) 98.1 F (36.7 C) (Oral)  Resp 18  Wt 196 lb (88.905 kg)  SpO2 97%  Wt Readings from Last 3 Encounters:  09/25/14 196 lb (88.905 kg)  02/18/14 174 lb 12 oz (79.266 kg)  12/10/13 170 lb 12 oz (77.452 kg)    Physical Exam  Constitutional: He appears well-developed and well-nourished. No distress.  HENT:  Head: Normocephalic and atraumatic.  Right Ear: Hearing, tympanic membrane, external ear and ear canal normal.  Left Ear: Hearing, tympanic membrane, external ear and ear canal normal.  Nose: Nose normal. No mucosal edema or rhinorrhea. Right sinus exhibits no maxillary sinus tenderness and no frontal sinus tenderness. Left sinus exhibits no maxillary sinus tenderness and no frontal sinus tenderness.  Mouth/Throat: Uvula is midline and mucous membranes are normal. Posterior oropharyngeal erythema present. No oropharyngeal exudate, posterior oropharyngeal edema or tonsillar abscesses.  Eyes: Conjunctivae and EOM are normal. Pupils are equal, round, and reactive to light. No scleral icterus.  Neck: Normal range of motion. Neck supple.  Cardiovascular: Normal rate, regular rhythm, normal heart sounds and intact distal pulses.   No murmur heard. Pulmonary/Chest: Effort normal and breath sounds normal. No respiratory distress. He has no wheezes. He has no rales.  Coarse breath sounds  Lymphadenopathy:    He has no cervical adenopathy.  Skin: Skin is warm and dry. No rash noted.  Nursing note and vitals reviewed.     Assessment & Plan:   Problem List Items Addressed This Visit    COPD (chronic obstructive pulmonary disease)    Noticing DOE - will start foradil - sent to pharmacy.      Relevant Medications   formoterol (FORADIL) 12 MCG capsule for inhaler   Acute bronchitis - Primary    Anticipate  bronchitis flare in h/o COPD - treat with doxycycline antibiotic course. Push fluids and rest. Albuterol as needed for shortness of breath.  Update if not improving with treatment.          Follow up plan: Return if symptoms worsen or fail to improve, for medicare wellness visit.

## 2014-09-25 NOTE — Patient Instructions (Addendum)
You have a bronchitis Treat with doxycycline. Push fluids and plenty of rest. Please return if you are not improving as expected, or if you have high fevers (>101.5) or difficulty swallowing or worsening productive cough. Start foradil for COPD and breathing.  Call clinic with questions.  Good to see you today. I hope you start feeling better soon. Schedule wellness at your convenience

## 2014-10-24 ENCOUNTER — Telehealth: Payer: Self-pay | Admitting: Primary Care

## 2014-10-24 ENCOUNTER — Other Ambulatory Visit: Payer: Self-pay

## 2014-10-24 MED ORDER — DOXYCYCLINE HYCLATE 100 MG PO CAPS: 100.0000 mg | ORAL_CAPSULE | Freq: Two times a day (BID) | ORAL | Status: DC

## 2014-10-24 NOTE — Telephone Encounter (Signed)
Pt was seen 09/25/14 and finished abx and all symptoms were resolved. 10/19/14 pt began with non prod cough with occasional sputum and S/T; no fever,wheezing or SOB.pt request refill of doxycycline to [facility]. Pt request cb.

## 2014-10-24 NOTE — Telephone Encounter (Signed)
Called patient and on his way to pick rx.

## 2014-10-24 NOTE — Telephone Encounter (Signed)
Spoke with Gregory Abbott regarding his symptoms. He reports he felt better after his course of Doxycycline, but never felt  completely better from his last course. He started experiencing a cough, chills, and a sluggish feeling on 4/17, and is not feeling any better. Will send additional 7 days of Doxycycline with instructions to notify his PCP if no improvement. He verbalized understanding.

## 2014-10-24 NOTE — Op Note (Signed)
PATIENT NAME:  Gregory Abbott, Gregory Abbott MR#:  [id number] DATE OF BIRTH:  Jan 30, 1946  DATE OF PROCEDURE:  06/11/2013  PREOPERATIVE DIAGNOSIS: Visually significant cataract of the left eye.   POSTOPERATIVE DIAGNOSIS: Visually significant cataract of the left eye.   OPERATIVE PROCEDURE: Cataract extraction by phacoemulsification with implant of intraocular lens to left eye.   SURGEON: Birder Robson, MD.   ANESTHESIA:  1. Managed anesthesia care.  2. Topical tetracaine drops followed by 2% Xylocaine jelly applied in the preoperative holding area.   COMPLICATIONS: None.   TECHNIQUE:  Stop and chop.   DESCRIPTION OF PROCEDURE: The patient was examined and consented in the preoperative holding area where the aforementioned topical anesthesia was applied to the left eye and then brought back to the Operating Room where the left eye was prepped and draped in the usual sterile ophthalmic fashion and a lid speculum was placed. A paracentesis was created with the side port blade and the anterior chamber was filled with viscoelastic. A near clear corneal incision was performed with the steel keratome. A continuous curvilinear capsulorrhexis was performed with a cystotome followed by the capsulorrhexis forceps. Hydrodissection and hydrodelineation were carried out with BSS on a blunt cannula. The lens was removed in a stop and chop  technique and the remaining cortical material was removed with the irrigation-aspiration handpiece. The capsular bag was inflated with viscoelastic and the Tecnis ZCB00 21.0-diopter lens, serial number [id number] was placed in the capsular bag without complication. The remaining viscoelastic was removed from the eye with the irrigation-aspiration handpiece. The wounds were hydrated. The anterior chamber was flushed with Miostat and the eye was inflated to physiologic pressure. Cefuroxime was not placed in the anterior chamber due to penicillin allergy.  The wounds were found to be water  tight. The eye was dressed with Vigamox. The patient was given protective glasses to wear throughout the day and a shield with which to sleep tonight. The patient was also given drops with which to begin a drop regimen today and will follow-up with me in one day.     ____________________________ Livingston Diones. Trianna Lupien, MD wlp:cc D: 06/11/2013 22:12:40 ET T: 06/11/2013 22:54:43 ET JOB#: [id number]  cc: Sahas Sluka L. Zelene Barga, MD, <Dictator> Livingston Diones Uzziah Rigg MD ELECTRONICALLY SIGNED 06/12/2013 12:09

## 2015-03-28 ENCOUNTER — Other Ambulatory Visit: Payer: Self-pay | Admitting: Family Medicine

## 2015-03-28 DIAGNOSIS — I10 Essential (primary) hypertension: Secondary | ICD-10-CM

## 2015-03-28 DIAGNOSIS — Z125 Encounter for screening for malignant neoplasm of prostate: Secondary | ICD-10-CM

## 2015-03-28 DIAGNOSIS — D751 Secondary polycythemia: Secondary | ICD-10-CM

## 2015-03-30 ENCOUNTER — Other Ambulatory Visit (INDEPENDENT_AMBULATORY_CARE_PROVIDER_SITE_OTHER): Payer: Medicare Other

## 2015-03-30 DIAGNOSIS — I10 Essential (primary) hypertension: Secondary | ICD-10-CM | POA: Diagnosis not present

## 2015-03-30 DIAGNOSIS — Z125 Encounter for screening for malignant neoplasm of prostate: Secondary | ICD-10-CM

## 2015-03-30 DIAGNOSIS — D751 Secondary polycythemia: Secondary | ICD-10-CM | POA: Diagnosis not present

## 2015-03-30 DIAGNOSIS — Z23 Encounter for immunization: Secondary | ICD-10-CM | POA: Diagnosis not present

## 2015-03-30 LAB — CBC WITH DIFFERENTIAL/PLATELET
Basophils Absolute: 0 10*3/uL (ref 0.0–0.1)
Basophils Relative: 0.4 % (ref 0.0–3.0)
EOS PCT: 12.2 % — AB (ref 0.0–5.0)
Eosinophils Absolute: 1.4 10*3/uL — ABNORMAL HIGH (ref 0.0–0.7)
HCT: 52.3 % — ABNORMAL HIGH (ref 39.0–52.0)
HEMOGLOBIN: 17.3 g/dL — AB (ref 13.0–17.0)
Lymphocytes Relative: 48.3 % — ABNORMAL HIGH (ref 12.0–46.0)
Lymphs Abs: 5.6 10*3/uL — ABNORMAL HIGH (ref 0.7–4.0)
MCHC: 33.1 g/dL (ref 30.0–36.0)
MCV: 93.1 fl (ref 78.0–100.0)
MONOS PCT: 9.1 % (ref 3.0–12.0)
Monocytes Absolute: 1.1 10*3/uL — ABNORMAL HIGH (ref 0.1–1.0)
Neutro Abs: 3.5 10*3/uL (ref 1.4–7.7)
Neutrophils Relative %: 30 % — ABNORMAL LOW (ref 43.0–77.0)
Platelets: 271 10*3/uL (ref 150.0–400.0)
RBC: 5.61 Mil/uL (ref 4.22–5.81)
RDW: 15.1 % (ref 11.5–15.5)
WBC: 11.5 10*3/uL — ABNORMAL HIGH (ref 4.0–10.5)

## 2015-03-30 LAB — BASIC METABOLIC PANEL
BUN: 12 mg/dL (ref 6–23)
CO2: 28 meq/L (ref 19–32)
CREATININE: 1.04 mg/dL (ref 0.40–1.50)
Calcium: 9.8 mg/dL (ref 8.4–10.5)
Chloride: 101 mEq/L (ref 96–112)
GFR: 90.86 mL/min (ref >60.00)
GLUCOSE: 112 mg/dL — AB (ref 70–99)
Potassium: 3.9 mEq/L (ref 3.5–5.1)
Sodium: 140 mEq/L (ref 135–145)

## 2015-03-30 LAB — LIPID PANEL
Cholesterol: 220 mg/dL — ABNORMAL HIGH (ref 0–200)
HDL: 43 mg/dL (ref >39.00)
NonHDL: 177.16
Total CHOL/HDL Ratio: 5
Triglycerides: 236 mg/dL — ABNORMAL HIGH (ref 0.0–149.0)
VLDL: 47.2 mg/dL — ABNORMAL HIGH (ref 0.0–40.0)

## 2015-03-30 LAB — PSA, MEDICARE: PSA: 1.35 ng/ml (ref 0.10–4.00)

## 2015-03-30 LAB — LDL CHOLESTEROL, DIRECT: Direct LDL: 143 mg/dL

## 2015-04-03 ENCOUNTER — Encounter: Payer: Self-pay | Admitting: Family Medicine

## 2015-04-03 ENCOUNTER — Ambulatory Visit (INDEPENDENT_AMBULATORY_CARE_PROVIDER_SITE_OTHER): Payer: Medicare Other | Admitting: Family Medicine

## 2015-04-03 VITALS — BP 144/92 | HR 68 | Temp 98.0°F | Ht 69.5 in | Wt 192.2 lb

## 2015-04-03 DIAGNOSIS — Z87891 Personal history of nicotine dependence: Secondary | ICD-10-CM

## 2015-04-03 DIAGNOSIS — Z0000 Encounter for general adult medical examination without abnormal findings: Secondary | ICD-10-CM

## 2015-04-03 DIAGNOSIS — I10 Essential (primary) hypertension: Secondary | ICD-10-CM

## 2015-04-03 DIAGNOSIS — Z7189 Other specified counseling: Secondary | ICD-10-CM | POA: Insufficient documentation

## 2015-04-03 DIAGNOSIS — J449 Chronic obstructive pulmonary disease, unspecified: Secondary | ICD-10-CM

## 2015-04-03 DIAGNOSIS — E1169 Type 2 diabetes mellitus with other specified complication: Secondary | ICD-10-CM | POA: Insufficient documentation

## 2015-04-03 DIAGNOSIS — Z23 Encounter for immunization: Secondary | ICD-10-CM

## 2015-04-03 DIAGNOSIS — E118 Type 2 diabetes mellitus with unspecified complications: Secondary | ICD-10-CM | POA: Insufficient documentation

## 2015-04-03 DIAGNOSIS — E119 Type 2 diabetes mellitus without complications: Secondary | ICD-10-CM | POA: Insufficient documentation

## 2015-04-03 DIAGNOSIS — R739 Hyperglycemia, unspecified: Secondary | ICD-10-CM

## 2015-04-03 MED ORDER — ATENOLOL 50 MG PO TABS: 50.0000 mg | ORAL_TABLET | Freq: Two times a day (BID) | ORAL | Status: DC

## 2015-04-03 MED ORDER — LOSARTAN POTASSIUM-HCTZ 100-12.5 MG PO TABS: ORAL_TABLET | ORAL | Status: DC

## 2015-04-03 NOTE — Progress Notes (Signed)
BP 144/92 mmHg  Pulse 68  Temp(Src) 98 F (36.7 C) (Oral)  Ht 5' 9.5" (1.765 m)  Wt 192 lb 4 oz (87.204 kg)  BMI 27.99 kg/m2   CC: medicare wellness visit  Subjective:    Patient ID: Gregory Abbott, male    DOB: 29-Mar-1946, 69 y.o.   MRN: [id number]  HPI: Gregory Abbott is a 69 y.o. male presenting on 04/03/2015 for Annual Exam   Worried about developing diabetes. Noticing some intermittent foot pains.   Formoterol was too expensive to continue. Denies cough or wheeze. Ongoing dyspnea on exertion.   Failed hearing screen today.Denies trouble with hearing. Declines audiology referral Eye exam 05/2014 at eye center No falls, no depression.  Preventative: COLONOSCOPY Date: 05/2013 1 polyp, int/ext hemorrhoids Carlean Purl) Prostate cancer screening - discussed, would like screening today. Flu shot - yearly Tdap - 2012 prevnar 11/2013, pneumovax today Shingle shot - have not discussed. Advanced directives - Working on this through Chief Executive Officer. Daughter would be HCPOA. Will bring copy when he completes this. Seat belt use discussed  Lives with wife, no pets Occupation: retired, Engineer, mining Edu: Degree Activity: housework, no regular exercise Diet: good water, fruits/vegetables daily  Relevant past medical, surgical, family and social history reviewed and updated as indicated. Interim medical history since our last visit reviewed. Allergies and medications reviewed and updated. Current Outpatient Prescriptions on File Prior to Visit  Medication Sig  . albuterol (PROVENTIL HFA;VENTOLIN HFA) 108 (90 BASE) MCG/ACT inhaler Inhale 2 puffs into the lungs every 6 (six) hours as needed for wheezing or shortness of breath.  [name] aspirin EC 81 MG tablet Take 81 mg by mouth every other day.  . clobetasol cream (TEMOVATE) 3.41 % Apply 1 application topically 2 (two) times daily. Apply to AA for hands, no more than 2 wks at a time  . clotrimazole-betamethasone (LOTRISONE) cream Apply 1 application  topically 2 (two) times daily. To foot. No more than 3-4 weeks at a time.  [name] loratadine (CLARITIN) 10 MG tablet Take 10 mg by mouth daily as needed for allergies.   No current facility-administered medications on file prior to visit.    Review of Systems Per HPI unless specifically indicated above     Objective:    BP 144/92 mmHg  Pulse 68  Temp(Src) 98 F (36.7 C) (Oral)  Ht 5' 9.5" (1.765 m)  Wt 192 lb 4 oz (87.204 kg)  BMI 27.99 kg/m2  Wt Readings from Last 3 Encounters:  04/03/15 192 lb 4 oz (87.204 kg)  09/25/14 196 lb (88.905 kg)  02/18/14 174 lb 12 oz (79.266 kg)    Physical Exam  Constitutional: He is oriented to person, place, and time. He appears well-developed and well-nourished. No distress.  HENT:  Head: Normocephalic and atraumatic.  Right Ear: Hearing, tympanic membrane, external ear and ear canal normal.  Left Ear: Hearing, tympanic membrane, external ear and ear canal normal.  Nose: Nose normal.  Mouth/Throat: Uvula is midline, oropharynx is clear and moist and mucous membranes are normal. No oropharyngeal exudate, posterior oropharyngeal edema or posterior oropharyngeal erythema.  Eyes: Conjunctivae and EOM are normal. Pupils are equal, round, and reactive to light. No scleral icterus.  Neck: Normal range of motion. Neck supple. Carotid bruit is not present. No thyromegaly present.  Cardiovascular: Normal rate, regular rhythm, normal heart sounds and intact distal pulses.   No murmur heard. Pulses:      Radial pulses are 2+ on the right side, and 2+ on the  left side.  Pulmonary/Chest: Effort normal and breath sounds normal. No respiratory distress. He has no wheezes. He has no rales.  Abdominal: Soft. Bowel sounds are normal. He exhibits no distension and no mass. There is no tenderness. There is no rebound and no guarding.  Genitourinary: Rectum normal and prostate normal. Rectal exam shows no external hemorrhoid, no internal hemorrhoid, no fissure, no mass,  no tenderness and anal tone normal. Prostate is not enlarged (15gm) and not tender.  Musculoskeletal: Normal range of motion. He exhibits no edema.  Lymphadenopathy:    He has no cervical adenopathy.  Neurological: He is alert and oriented to person, place, and time.  CN grossly intact, station and gait intact Recall 3/3 Calculation 5/5 serial 3s  Skin: Skin is warm and dry. No rash noted.  Psychiatric: He has a normal mood and affect. His behavior is normal. Judgment and thought content normal.  Nursing note and vitals reviewed.  Results for orders placed or performed in visit on 03/30/15  Lipid panel  Result Value Ref Range   Cholesterol 220 (H) 0 - 200 mg/dL   Triglycerides 236.0 (H) 0.0 - 149.0 mg/dL   HDL 43.00 >39.00 mg/dL   VLDL 47.2 (H) 0.0 - 40.0 mg/dL   Total CHOL/HDL Ratio 5    NonHDL 177.16   CBC with Differential/Platelet  Result Value Ref Range   WBC 11.5 (H) 4.0 - 10.5 K/uL   RBC 5.61 4.22 - 5.81 Mil/uL   Hemoglobin 17.3 (H) 13.0 - 17.0 g/dL   HCT 52.3 (H) 39.0 - 52.0 %   MCV 93.1 78.0 - 100.0 fl   MCHC 33.1 30.0 - 36.0 g/dL   RDW 15.1 11.5 - 15.5 %   Platelets 271.0 150.0 - 400.0 K/uL   Neutrophils Relative % 30.0 (L) 43.0 - 77.0 %   Lymphocytes Relative 48.3 (H) 12.0 - 46.0 %   Monocytes Relative 9.1 3.0 - 12.0 %   Eosinophils Relative 12.2 (H) 0.0 - 5.0 %   Basophils Relative 0.4 0.0 - 3.0 %   Neutro Abs 3.5 1.4 - 7.7 K/uL   Lymphs Abs 5.6 (H) 0.7 - 4.0 K/uL   Monocytes Absolute 1.1 (H) 0.1 - 1.0 K/uL   Eosinophils Absolute 1.4 (H) 0.0 - 0.7 K/uL   Basophils Absolute 0.0 0.0 - 0.1 K/uL  Basic metabolic panel  Result Value Ref Range   Sodium 140 135 - 145 mEq/L   Potassium 3.9 3.5 - 5.1 mEq/L   Chloride 101 96 - 112 mEq/L   CO2 28 19 - 32 mEq/L   Glucose, Bld 112 (H) 70 - 99 mg/dL   BUN 12 6 - 23 mg/dL   Creatinine, Ser 1.04 0.40 - 1.50 mg/dL   Calcium 9.8 8.4 - 10.5 mg/dL   GFR 90.86 >60.00 mL/min  PSA, Medicare  Result Value Ref Range   PSA  1.35 0.10 - 4.00 ng/ml  LDL cholesterol, direct  Result Value Ref Range   Direct LDL 143.0 mg/dL  No results found for: HGBA1C     Assessment & Plan:  Hep C screen next visit Problem List Items Addressed This Visit    Medicare annual wellness visit, initial - Primary    I have personally reviewed the Medicare Annual Wellness questionnaire and have noted 1. The patient's medical and social history 2. Their use of alcohol, tobacco or illicit drugs 3. Their current medications and supplements 4. The patient's functional ability including ADL's, fall risks, home safety risks and hearing or visual  impairment. Cognitive function has been assessed and addressed as indicated.  5. Diet and physical activity 6. Evidence for depression or mood disorders The patients weight, height, BMI have been recorded in the chart. I have made referrals, counseling and provided education to the patient based on review of the above and I have provided the pt with a written personalized care plan for preventive services. Provider list updated.. See scanned questionairre as needed for further documentation. Reviewed preventative protocols and updated unless pt declined.       Hypertension    Chronic, stable. Continue current regimen. Encouraged atenolol 50mg  BID (has been using 100mg  once daily).       Relevant Medications   atenolol (TENORMIN) 50 MG tablet   losartan-hydrochlorothiazide (HYZAAR) 100-12.5 MG tablet   Hyperglycemia    Sugars recently elevated - check A1c next lab work. Discussed avoiding added sugars and sweetened beverages.      Ex-smoker    Stays abstinent.       COPD (chronic obstructive pulmonary disease)    Chronic, very mild, off meds. Could not afford foradil.       Advanced care planning/counseling discussion    Working on this through Chief Executive Officer. Daughter would be HCPOA. Will bring copy when he completes this.          Follow up plan: Return in about 6 months (around  10/01/2015), or as needed, for follow up visit.

## 2015-04-03 NOTE — Assessment & Plan Note (Signed)

## 2015-04-03 NOTE — Assessment & Plan Note (Signed)
Chronic, stable. Continue current regimen. Encouraged atenolol 50mg  BID (has been using 100mg  once daily).

## 2015-04-03 NOTE — Assessment & Plan Note (Signed)
Sugars recently elevated - check A1c next lab work. Discussed avoiding added sugars and sweetened beverages.

## 2015-04-03 NOTE — Addendum Note (Signed)
Addended by: Royann Shivers A on: 04/03/2015 12:49 PM   Modules accepted: Orders

## 2015-04-03 NOTE — Assessment & Plan Note (Signed)
Working on this through Chief Executive Officer. Daughter would be HCPOA. Will bring copy when he completes this.

## 2015-04-03 NOTE — Assessment & Plan Note (Signed)
Chronic, very mild, off meds. Could not afford foradil.

## 2015-04-03 NOTE — Progress Notes (Signed)
Pre visit review using our clinic review tool, if applicable. No additional management support is needed unless otherwise documented below in the visit note. 

## 2015-04-03 NOTE — Patient Instructions (Addendum)
pneumovax today. Goal blood pressure <140/90. Keep an eye on blood pressure at home and let me know if consistently elevated. Bring me copy of living will when you complete. Watch added sugars, avoid sweets and sweetened beverages.  Return as needed or in 6 months for follow up visit and repeat labs (check sugar again) Nice to see you today, call us with questions.

## 2015-04-03 NOTE — Assessment & Plan Note (Signed)
Stays abstinent.

## 2015-04-06 ENCOUNTER — Other Ambulatory Visit: Payer: Self-pay | Admitting: Family Medicine

## 2015-04-08 DIAGNOSIS — Z961 Presence of intraocular lens: Secondary | ICD-10-CM | POA: Diagnosis not present

## 2015-04-08 DIAGNOSIS — H524 Presbyopia: Secondary | ICD-10-CM | POA: Diagnosis not present

## 2015-04-08 DIAGNOSIS — H5211 Myopia, right eye: Secondary | ICD-10-CM | POA: Diagnosis not present

## 2015-04-08 DIAGNOSIS — H04123 Dry eye syndrome of bilateral lacrimal glands: Secondary | ICD-10-CM | POA: Diagnosis not present

## 2015-04-08 DIAGNOSIS — H25811 Combined forms of age-related cataract, right eye: Secondary | ICD-10-CM | POA: Diagnosis not present

## 2015-04-27 ENCOUNTER — Encounter: Payer: Self-pay | Admitting: Family Medicine

## 2015-04-27 ENCOUNTER — Ambulatory Visit (INDEPENDENT_AMBULATORY_CARE_PROVIDER_SITE_OTHER): Payer: Medicare Other | Admitting: Family Medicine

## 2015-04-27 VITALS — BP 138/80 | HR 68 | Temp 97.8°F | Wt 192.8 lb

## 2015-04-27 DIAGNOSIS — R21 Rash and other nonspecific skin eruption: Secondary | ICD-10-CM | POA: Diagnosis not present

## 2015-04-27 MED ORDER — CEPHALEXIN 500 MG PO CAPS: 500.0000 mg | ORAL_CAPSULE | Freq: Three times a day (TID) | ORAL | Status: DC

## 2015-04-27 NOTE — Progress Notes (Signed)
Pre visit review using our clinic review tool, if applicable. No additional management support is needed unless otherwise documented below in the visit note. 

## 2015-04-27 NOTE — Assessment & Plan Note (Signed)
Possible mild cellulitis vs phlebitis - treat with keflex course. Update if persistent rash/discomfort. Pt agrees with plan.

## 2015-04-27 NOTE — Progress Notes (Signed)
   BP 138/80 mmHg  Pulse 68  Temp(Src) 97.8 F (36.6 C) (Oral)  Wt 192 lb 12 oz (87.431 kg)   CC: L thigh rash  Subjective:    Patient ID: Gregory Abbott, male    DOB: [birth year], 69 y.o.   MRN: [id number]  HPI: Gregory Abbott is a 69 y.o. male presenting on 04/27/2015 for Rash   2-3 wk h/o dark itchy rash left inner distal thigh that is itchy. Comes and goes. Sensitive with different clothes, sometimes sensitive when bed sheet touches leg.   No new lotions, detergents, soaps or shampoos.  Persistent jock itch.  No other skin rash.   Relevant past medical, surgical, family and social history reviewed and updated as indicated. Interim medical history since our last visit reviewed. Allergies and medications reviewed and updated. Current Outpatient Prescriptions on File Prior to Visit  Medication Sig  . albuterol (PROVENTIL HFA;VENTOLIN HFA) 108 (90 BASE) MCG/ACT inhaler Inhale 2 puffs into the lungs every 6 (six) hours as needed for wheezing or shortness of breath.  [name] aspirin EC 81 MG tablet Take 81 mg by mouth every other day.  [name] atenolol (TENORMIN) 50 MG tablet Take 1 tablet (50 mg total) by mouth 2 (two) times daily.  . clobetasol cream (TEMOVATE) 2.87 % Apply 1 application topically 2 (two) times daily. Apply to AA for hands, no more than 2 wks at a time  . clotrimazole-betamethasone (LOTRISONE) cream Apply 1 application topically 2 (two) times daily. To foot. No more than 3-4 weeks at a time.  [name] loratadine (CLARITIN) 10 MG tablet Take 10 mg by mouth daily as needed for allergies.  [name] losartan-hydrochlorothiazide (HYZAAR) 100-12.5 MG tablet TAKE ONE TABLET DAILY   No current facility-administered medications on file prior to visit.    Review of Systems Per HPI unless specifically indicated in ROS section     Objective:    BP 138/80 mmHg  Pulse 68  Temp(Src) 97.8 F (36.6 C) (Oral)  Wt 192 lb 12 oz (87.431 kg)  Wt Readings from Last 3 Encounters:  04/27/15 192 lb 12  oz (87.431 kg)  04/03/15 192 lb 4 oz (87.204 kg)  09/25/14 196 lb (88.905 kg)    Physical Exam  Constitutional: He appears well-developed and well-nourished. No distress.  Skin: Skin is warm and dry. Rash noted. There is erythema.     Slightly puffy erythematous area L lower medial thigh, mildly tender to palpation Groin free of rash  Nursing note and vitals reviewed.      Assessment & Plan:   Problem List Items Addressed This Visit    Skin rash - Primary    Possible mild cellulitis vs phlebitis - treat with keflex course. Update if persistent rash/discomfort. Pt agrees with plan.          Follow up plan: Return if symptoms worsen or fail to improve.

## 2015-04-27 NOTE — Patient Instructions (Addendum)
Possible skin infection - treat with keflex course for next 5 days. May also use clotrimazole cream to rash. Let us know if not improved with this.

## 2015-07-02 ENCOUNTER — Encounter: Payer: Self-pay | Admitting: *Deleted

## 2015-07-27 ENCOUNTER — Ambulatory Visit (INDEPENDENT_AMBULATORY_CARE_PROVIDER_SITE_OTHER): Payer: Medicare Other | Admitting: Primary Care

## 2015-07-27 VITALS — BP 162/92 | HR 67 | Temp 96.7°F | Ht 69.5 in | Wt 194.8 lb

## 2015-07-27 DIAGNOSIS — R21 Rash and other nonspecific skin eruption: Secondary | ICD-10-CM

## 2015-07-27 MED ORDER — PREDNISONE 10 MG PO TABS: ORAL_TABLET | ORAL | Status: DC

## 2015-07-27 NOTE — Progress Notes (Signed)
Pre visit review using our clinic review tool, if applicable. No additional management support is needed unless otherwise documented below in the visit note. 

## 2015-07-27 NOTE — Progress Notes (Signed)
Subjective:    Patient ID: Gregory Abbott, male    DOB: [birth year], 70 y.o.   MRN: [id number]  HPI  Gregory Abbott is a 70 year old male who presents today with a chief complaint of rash. His rash is located to the right lateral trunk. He has a long standing history of this rash and was evaluated by dermatology in 2015 and was prescribed triamcinolone cream and cetaphil cream in the past. He presented to the pharmacy regarding his shingles vaccination and was turned away due to current rash. He's taking claritin for the past 2 days, applying clobetasol cream, and also applying OTC cortisone cream without improvement. Denies changes in food, soaps, detergents, pets with fleas, symptoms of bedbugs or scabies.   Review of Systems  Constitutional: Negative for fever.  HENT: Negative for sore throat.   Skin: Positive for rash. Negative for color change and wound.       Past Medical History  Diagnosis Date  . Childhood asthma   . History of chicken pox   . Hypertension   . Lower back pain     h/o herniated and bulging discs lower back  . Ex-smoker     39 PY hx  . BPH (benign prostatic hypertrophy)   . Osteoarthritis of neck   . PUD (peptic ulcer disease)   . Blood transfusion without reported diagnosis   . Cataract   . Personal history of colonic polyps 05/07/2013    resected not retrieved  . S/P splenectomy     partial  . Shortness of breath   . COPD (chronic obstructive pulmonary disease) ([place])     Social History   Social History  . Marital Status: Married    Spouse Name: N/A  . Number of Children: 2  . Years of Education: N/A   Occupational History  . Retired    Social History Main Topics  . Smoking status: Former Smoker -- 0.25 packs/day    Types: Cigarettes    Quit date: 11/06/2013  . Smokeless tobacco: Never Used  . Alcohol Use: 5.4 oz/week    6 Cans of beer, 3 Shots of liquor per week     Comment: Social  . Drug Use: No  . Sexual Activity: Yes   Other Topics  Concern  . Not on file   Social History Narrative   Lives with wife, no pets   Occupation: retired, Engineer, mining   Edu: Degree   Activity: housework, no regular exercise   Diet: good water, fruits/vegetables daily    Past Surgical History  Procedure Laterality Date  . Splenectomy, partial  1998    fall down stairs  . Colonoscopy  2009  . Lower extremity arterial doppler  2013    WNL  . Knee arthroscopy    . Aaa screen  04/2013    neg for AAA  . Colonoscopy  05/2013    1 polyp, int/ext hemorrhoids Carlean Purl)  . Cataract extraction Left 05/2013    [facility]    Family History  Problem Relation Age of Onset  . Diabetes Mother   . Hypertension Mother   . Hyperlipidemia Mother   . Heart disease Mother   . Stroke Father   . Colon cancer Neg Hx   . Rectal cancer Neg Hx   . Stomach cancer Neg Hx   . Esophageal cancer Neg Hx     Allergies  Allergen Reactions  . Penicillins Other (See Comments)    Unknown - as child. ?asthma  .  Azithromycin Rash    Current Outpatient Prescriptions on File Prior to Visit  Medication Sig Dispense Refill  . aspirin EC 81 MG tablet Take 81 mg by mouth every other day.    [name] atenolol (TENORMIN) 50 MG tablet Take 1 tablet (50 mg total) by mouth 2 (two) times daily. 180 tablet 3  . clobetasol cream (TEMOVATE) [id number] % Apply 1 application topically 2 (two) times daily. Apply to AA for hands, no more than 2 wks at a time 60 g 0  . clotrimazole-betamethasone (LOTRISONE) cream Apply 1 application topically 2 (two) times daily. To foot. No more than 3-4 weeks at a time. 45 g 0  . loratadine (CLARITIN) 10 MG tablet Take 10 mg by mouth daily as needed for allergies.    [name] losartan-hydrochlorothiazide (HYZAAR) 100-12.5 MG tablet TAKE ONE TABLET DAILY 90 tablet 3  . albuterol (PROVENTIL HFA;VENTOLIN HFA) 108 (90 BASE) MCG/ACT inhaler Inhale 2 puffs into the lungs every 6 (six) hours as needed for wheezing or shortness of breath. (Patient not taking: Reported on  07/27/2015) 1 Inhaler 2   No current facility-administered medications on file prior to visit.    BP 162/92 mmHg  Pulse 67  Temp(Src) 96.7 F (35.9 C) (Oral)  Ht 5' 9.5" (1.765 m)  Wt 194 lb 12.8 oz (88.361 kg)  BMI 28.36 kg/m2  SpO2 97%    Objective:   Physical Exam  Constitutional: He appears well-nourished.  Cardiovascular: Normal rate and regular rhythm.   Pulmonary/Chest: Effort normal and breath sounds normal.  Skin: Rash noted.  Mild pruritic rash to right lateral trunk. Does not appear to be cellulitis or contact dermatitis. No erythema or open wounds.           Assessment & Plan:

## 2015-07-27 NOTE — Assessment & Plan Note (Signed)
Recurrent. Occasionally located to right or left medial knees and trunk. Today to right lateral trunk. Does not appear to be cellulitis, shingles, or contact dermatitis. No improvement with clobetasol cream. Unsure of etiology. Will send in prednisone taper and referral to dermatology. Return precautions provided.

## 2015-07-27 NOTE — Patient Instructions (Addendum)
Start prednisone tablets for rash. Take 3 tablets by mouth for 2 days, then 2 tablets for 2 days, then 1 tablet for 2 days.   You will be contacted regarding your referral to Dermatology.  Please let us know if you have not heard back within one week.   It was a pleasure meeting you!

## 2015-08-03 DIAGNOSIS — L209 Atopic dermatitis, unspecified: Secondary | ICD-10-CM | POA: Diagnosis not present

## 2015-08-27 DIAGNOSIS — H2589 Other age-related cataract: Secondary | ICD-10-CM | POA: Diagnosis not present

## 2015-09-10 DIAGNOSIS — L209 Atopic dermatitis, unspecified: Secondary | ICD-10-CM | POA: Diagnosis not present

## 2015-09-10 DIAGNOSIS — L853 Xerosis cutis: Secondary | ICD-10-CM | POA: Diagnosis not present

## 2015-10-02 DIAGNOSIS — H2589 Other age-related cataract: Secondary | ICD-10-CM | POA: Diagnosis not present

## 2015-10-05 ENCOUNTER — Encounter: Payer: Self-pay | Admitting: *Deleted

## 2015-10-06 ENCOUNTER — Ambulatory Visit: Payer: Medicare Other | Admitting: Anesthesiology

## 2015-10-06 ENCOUNTER — Ambulatory Visit
Admission: RE | Admit: 2015-10-06 | Discharge: 2015-10-06 | Disposition: A | Discharge status: Home / Self Care | Payer: Medicare Other | Source: Ambulatory Visit | Attending: Ophthalmology | Admitting: Ophthalmology

## 2015-10-06 ENCOUNTER — Encounter: Payer: Self-pay | Admitting: *Deleted

## 2015-10-06 ENCOUNTER — Encounter
Admission: RE | Disposition: A | Discharge status: Home / Self Care | Payer: Self-pay | Source: Ambulatory Visit | Attending: Ophthalmology

## 2015-10-06 DIAGNOSIS — Z87891 Personal history of nicotine dependence: Secondary | ICD-10-CM | POA: Diagnosis not present

## 2015-10-06 DIAGNOSIS — Z8711 Personal history of peptic ulcer disease: Secondary | ICD-10-CM | POA: Insufficient documentation

## 2015-10-06 DIAGNOSIS — R0602 Shortness of breath: Secondary | ICD-10-CM | POA: Diagnosis not present

## 2015-10-06 DIAGNOSIS — J449 Chronic obstructive pulmonary disease, unspecified: Secondary | ICD-10-CM | POA: Diagnosis not present

## 2015-10-06 DIAGNOSIS — Z9842 Cataract extraction status, left eye: Secondary | ICD-10-CM | POA: Diagnosis not present

## 2015-10-06 DIAGNOSIS — L309 Dermatitis, unspecified: Secondary | ICD-10-CM | POA: Diagnosis not present

## 2015-10-06 DIAGNOSIS — M1990 Unspecified osteoarthritis, unspecified site: Secondary | ICD-10-CM | POA: Insufficient documentation

## 2015-10-06 DIAGNOSIS — K219 Gastro-esophageal reflux disease without esophagitis: Secondary | ICD-10-CM | POA: Insufficient documentation

## 2015-10-06 DIAGNOSIS — H2511 Age-related nuclear cataract, right eye: Secondary | ICD-10-CM | POA: Diagnosis not present

## 2015-10-06 DIAGNOSIS — Z880 Allergy status to penicillin: Secondary | ICD-10-CM | POA: Insufficient documentation

## 2015-10-06 DIAGNOSIS — H2589 Other age-related cataract: Secondary | ICD-10-CM | POA: Diagnosis not present

## 2015-10-06 DIAGNOSIS — Z9081 Acquired absence of spleen: Secondary | ICD-10-CM | POA: Diagnosis not present

## 2015-10-06 DIAGNOSIS — Z881 Allergy status to other antibiotic agents status: Secondary | ICD-10-CM | POA: Diagnosis not present

## 2015-10-06 HISTORY — PX: CATARACT EXTRACTION W/PHACO: SHX586

## 2015-10-06 HISTORY — DX: Gastro-esophageal reflux disease without esophagitis: K21.9

## 2015-10-06 HISTORY — DX: Dermatitis, unspecified: L30.9

## 2015-10-06 SURGERY — PHACOEMULSIFICATION, CATARACT, WITH IOL INSERTION
Anesthesia: Monitor Anesthesia Care | Laterality: Right

## 2015-10-06 MED ORDER — MIDAZOLAM HCL 2 MG/2ML IJ SOLN
INTRAMUSCULAR | Status: DC | PRN
  Administered 2015-10-06: 1 mg via INTRAVENOUS

## 2015-10-06 MED ORDER — POVIDONE-IODINE 5 % OP SOLN
1.0000 "application " | OPHTHALMIC | Status: AC | PRN
  Administered 2015-10-06: 1 via OPHTHALMIC

## 2015-10-06 MED ORDER — SODIUM CHLORIDE 0.9 % IV SOLN
INTRAVENOUS | Status: DC
  Administered 2015-10-06: 07:00:00 via INTRAVENOUS

## 2015-10-06 MED ORDER — TRYPAN BLUE 0.06 % OP SOLN
OPHTHALMIC | Status: DC | PRN
  Administered 2015-10-06: .5 mL via INTRAOCULAR

## 2015-10-06 MED ORDER — ARMC OPHTHALMIC DILATING GEL
1.0000 "application " | OPHTHALMIC | Status: DC | PRN
  Administered 2015-10-06: 1 via OPHTHALMIC

## 2015-10-06 MED ORDER — EPINEPHRINE HCL 1 MG/ML IJ SOLN
INTRAOCULAR | Status: DC | PRN
  Administered 2015-10-06: 250 mL via OPHTHALMIC

## 2015-10-06 MED ORDER — TETRACAINE HCL 0.5 % OP SOLN
1.0000 [drp] | OPHTHALMIC | Status: AC | PRN
  Administered 2015-10-06: 1 [drp] via OPHTHALMIC

## 2015-10-06 MED ORDER — EPINEPHRINE HCL 1 MG/ML IJ SOLN
INTRAMUSCULAR | Status: AC
  Filled 2015-10-06: qty 2

## 2015-10-06 MED ORDER — LIDOCAINE HCL (PF) 4 % IJ SOLN
INTRAMUSCULAR | Status: DC | PRN
  Administered 2015-10-06: .5 mL via OPHTHALMIC

## 2015-10-06 MED ORDER — MOXIFLOXACIN HCL 0.5 % OP SOLN: 1.0000 [drp] | Freq: Once | OPHTHALMIC | Status: DC

## 2015-10-06 MED ORDER — FENTANYL CITRATE (PF) 100 MCG/2ML IJ SOLN
INTRAMUSCULAR | Status: DC | PRN
  Administered 2015-10-06: 50 ug via INTRAVENOUS

## 2015-10-06 MED ORDER — MOXIFLOXACIN HCL 0.5 % OP SOLN
OPHTHALMIC | Status: DC | PRN
  Administered 2015-10-06: 2 [drp] via OPHTHALMIC

## 2015-10-06 MED ORDER — NA CHONDROIT SULF-NA HYALURON 40-17 MG/ML IO SOLN
INTRAOCULAR | Status: AC
  Filled 2015-10-06: qty 1

## 2015-10-06 MED ORDER — LIDOCAINE HCL (PF) 4 % IJ SOLN
INTRAMUSCULAR | Status: AC
  Filled 2015-10-06: qty 5

## 2015-10-06 MED ORDER — CARBACHOL 0.01 % IO SOLN
INTRAOCULAR | Status: DC | PRN
  Administered 2015-10-06: .5 mL via INTRAOCULAR

## 2015-10-06 MED ORDER — TRYPAN BLUE 0.06 % OP SOLN
OPHTHALMIC | Status: AC
  Filled 2015-10-06: qty 0.5

## 2015-10-06 MED ORDER — NA CHONDROIT SULF-NA HYALURON 40-17 MG/ML IO SOLN
INTRAOCULAR | Status: DC | PRN
Start: 2015-10-06 — End: 2015-10-06
  Administered 2015-10-06: 1 mL via INTRAOCULAR

## 2015-10-06 SURGICAL SUPPLY — 24 items
CANNULA ANT/CHMB 27GA (MISCELLANEOUS) ×6 IMPLANT
CUP MEDICINE 2OZ PLAST GRAD ST (MISCELLANEOUS) ×2 IMPLANT
FILTER MILLEX .045 (MISCELLANEOUS) ×1 IMPLANT
FLTR MILLEX .045 (MISCELLANEOUS) ×2
GLOVE BIO SURGEON STRL SZ8 (GLOVE) ×2 IMPLANT
GLOVE BIOGEL M 6.5 STRL (GLOVE) ×2 IMPLANT
GLOVE SURG LX 8.0 MICRO (GLOVE) ×1
GLOVE SURG LX STRL 8.0 MICRO (GLOVE) ×1 IMPLANT
GOWN STRL REUS W/ TWL LRG LVL3 (GOWN DISPOSABLE) ×2 IMPLANT
GOWN STRL REUS W/TWL LRG LVL3 (GOWN DISPOSABLE) ×2
LENS IOL TECNIS 21.5 (Intraocular Lens) ×2 IMPLANT
LENS IOL TECNIS MONO 1P 21.5 (Intraocular Lens) ×1 IMPLANT
PACK CATARACT (MISCELLANEOUS) ×2 IMPLANT
PACK CATARACT BRASINGTON LX (MISCELLANEOUS) ×2 IMPLANT
PACK EYE AFTER SURG (MISCELLANEOUS) ×2 IMPLANT
SOL BSS BAG (MISCELLANEOUS) ×2
SOL PREP PVP 2OZ (MISCELLANEOUS)
SOLUTION BSS BAG (MISCELLANEOUS) ×1 IMPLANT
SOLUTION PREP PVP 2OZ (MISCELLANEOUS) IMPLANT
SYR 3ML LL SCALE MARK (SYRINGE) ×2 IMPLANT
SYR 5ML LL (SYRINGE) ×2 IMPLANT
SYR TB 1ML 27GX1/2 LL (SYRINGE) ×2 IMPLANT
WATER STERILE IRR 1000ML POUR (IV SOLUTION) ×2 IMPLANT
WIPE NON LINTING 3.25X3.25 (MISCELLANEOUS) ×2 IMPLANT

## 2015-10-06 NOTE — Transfer of Care (Signed)
Immediate Anesthesia Transfer of Care Note  Patient: Gregory Abbott  Procedure(s) Performed: Procedure(s) with comments: CATARACT EXTRACTION PHACO AND INTRAOCULAR LENS PLACEMENT (IOC) (Right) - [place] AP% CDE fluid pack lot 3 [id number] H exp 04/02/2017  Patient Location: PACU  Anesthesia Type:MAC  Level of Consciousness: awake, alert  and oriented  Airway & Oxygen Therapy: Patient Spontanous Breathing  Post-op Assessment: Report given to RN and Post -op Vital signs reviewed and stable  Post vital signs: Reviewed and stable  Last Vitals:  Filed Vitals:   10/06/15 0709  BP: 168/86  Pulse: 64  Temp: 36.6 C  Resp: 16    Complications: No apparent anesthesia complications

## 2015-10-06 NOTE — Anesthesia Procedure Notes (Signed)
Procedure Name: MAC Performed by: Hancel Ion Pre-anesthesia Checklist: Patient identified, Emergency Drugs available, Suction available, Patient being monitored and Timeout performed Oxygen Delivery Method: Nasal cannula       

## 2015-10-06 NOTE — H&P (Signed)
All labs reviewed. Abnormal studies sent to patients PCP when indicated.  Previous H&P reviewed, patient examined, there are NO CHANGES.  Gregory Lecomte LOUIS4/4/20178:19 AM

## 2015-10-06 NOTE — Discharge Instructions (Signed)
Eye Surgery Discharge Instructions  Expect mild scratchy sensation or mild soreness. DO NOT RUB YOUR EYE!  The day of surgery:  Minimal physical activity, but bed rest is not required  No reading, computer work, or close hand work  No bending, lifting, or straining.  May watch TV  For 24 hours:  No driving, legal decisions, or alcoholic beverages  Safety precautions  Eat anything you prefer: It is better to start with liquids, then soup then solid foods.  _____ Eye patch should be worn until postoperative exam tomorrow.  ____ Solar shield eyeglasses should be worn for comfort in the sunlight/patch while sleeping  Resume all regular medications including aspirin or Coumadin if these were discontinued prior to surgery. You may shower, bathe, shave, or wash your hair. Tylenol may be taken for mild discomfort.  Call your doctor if you experience significant pain, nausea, or vomiting, fever > 101 or other signs of infection. [phone] or [phone] Specific instructions:  Follow-up Information    Follow up with Tim Lair, MD On 10/07/2015.   Specialty:  Ophthalmology   Why:  10:45   Contact information:   [address] [place] [address] [phone]

## 2015-10-06 NOTE — Anesthesia Postprocedure Evaluation (Signed)
Anesthesia Post Note  Patient: Gregory Abbott  Procedure(s) Performed: Procedure(s) (LRB): CATARACT EXTRACTION PHACO AND INTRAOCULAR LENS PLACEMENT (IOC) (Right)  Patient location during evaluation: PACU Anesthesia Type: MAC Level of consciousness: awake, awake and alert and oriented Pain management: pain level controlled Vital Signs Assessment: post-procedure vital signs reviewed and stable Respiratory status: spontaneous breathing, nonlabored ventilation and respiratory function stable Cardiovascular status: blood pressure returned to baseline Postop Assessment: no headache Anesthetic complications: no    Last Vitals:  Filed Vitals:   10/06/15 0709  BP: 168/86  Pulse: 64  Temp: 36.6 C  Resp: 16    Last Pain: There were no vitals filed for this visit.               Eudora Guevarra,  [employer]

## 2015-10-06 NOTE — Op Note (Signed)
PREOPERATIVE DIAGNOSIS:  Nuclear sclerotic cataract of the right eye.   POSTOPERATIVE DIAGNOSIS:  nuclear sclerotic cataract riht eye   OPERATIVE PROCEDURE: Procedure(s): CATARACT EXTRACTION PHACO AND INTRAOCULAR LENS PLACEMENT (IOC)   SURGEON:  Birder Robson, MD.   ANESTHESIA:  Anesthesiologist: Gunnar Fusi, MD CRNA: Demetrius Charity, CRNA; Nelda Marseille, CRNA  1.      Managed anesthesia care. 2.      Topical tetracaine drops followed by 2% Xylocaine jelly applied in the preoperative holding area  3.    0.2 ml of epi-Shugarcaine was  placed in the anterior chamber following the paracentesis.    COMPLICATIONS:  None.   TECHNIQUE:   Stop and chop   DESCRIPTION OF PROCEDURE:  The patient was examined and consented in the preoperative holding area where the aforementioned topical anesthesia was applied to the right eye and then brought back to the Operating Room where the right eye was prepped and draped in the usual sterile ophthalmic fashion and a lid speculum was placed. A paracentesis was created with the side port blade and the anterior chamber was filled with viscoelastic. A near clear corneal incision was performed with the steel keratome. Vision Blue was used to stain the anterior capsule due to very poor/ no visualization of the red reflex.  A continuous curvilinear capsulorrhexis was performed with a cystotome followed by the capsulorrhexis forceps. Hydrodissection and hydrodelineation were carried out with BSS on a blunt cannula. The lens was removed in a stop and chop  technique and the remaining cortical material was removed with the irrigation-aspiration handpiece. The capsular bag was inflated with viscoelastic and the Technis [id number]  lens was placed in the capsular bag without complication. The remaining viscoelastic was removed from the eye with the irrigation-aspiration handpiece. The wounds were hydrated. The anterior chamber was flushed with Miostat and the eye was  inflated to physiologic pressure. 0.2 mL of Vigamox diluted three/one with BSS was placed in the anterior chamber. The wounds were found to be water tight. The eye was dressed with Vigamox. The patient was given protective glasses to wear throughout the day and a shield with which to sleep tonight. The patient was also given drops with which to begin a drop regimen today and will follow-up with me in one day.  Implant Name Type Inv. Item Serial No. Manufacturer Lot No. LRB No. Used  LENS IOL TECNIS 21.5 - [id number] Intraocular Lens LENS IOL TECNIS 21.5 [id number] AMO [id number] Right 1   Procedure(s) with comments: CATARACT EXTRACTION PHACO AND INTRAOCULAR LENS PLACEMENT (IOC) (Right) - [place] AP% CDE fluid pack lot 3 [id number] H exp 04/02/2017  Electronically signed: Norrin Shreffler LOUIS 10/06/2015 8:54 AM

## 2015-10-06 NOTE — Anesthesia Preprocedure Evaluation (Signed)
Anesthesia Evaluation  Patient identified by MRN, date of birth, ID band Patient awake    Reviewed: Allergy & Precautions, NPO status , Patient's Chart, lab work & pertinent test results  History of Anesthesia Complications Negative for: history of anesthetic complications  Airway Mallampati: II       Dental   Pulmonary shortness of breath, asthma , COPD,  COPD inhaler, former smoker,           Cardiovascular hypertension, Pt. on medications and Pt. on home beta blockers      Neuro/Psych negative neurological ROS     GI/Hepatic Neg liver ROS, PUD, GERD  ,  Endo/Other  negative endocrine ROS  Renal/GU negative Renal ROS     Musculoskeletal  (+) Arthritis ,   Abdominal   Peds  Hematology negative hematology ROS (+)   Anesthesia Other Findings   Reproductive/Obstetrics                             Anesthesia Physical Anesthesia Plan  ASA: II  Anesthesia Plan: General and MAC   Post-op Pain Management:    Induction: Intravenous  Airway Management Planned: Nasal Cannula  Additional Equipment:   Intra-op Plan:   Post-operative Plan:   Informed Consent: I have reviewed the patients History and Physical, chart, labs and discussed the procedure including the risks, benefits and alternatives for the proposed anesthesia with the patient or authorized representative who has indicated his/her understanding and acceptance.     Plan Discussed with:   Anesthesia Plan Comments:         Anesthesia Quick Evaluation

## 2016-01-25 ENCOUNTER — Telehealth: Payer: Self-pay | Admitting: Family Medicine

## 2016-01-25 NOTE — Telephone Encounter (Signed)
plz schedule office visit for evaluation

## 2016-01-25 NOTE — Telephone Encounter (Signed)
Spoke with patient and offered appt for today. He said he already had one scheduled for Wednesday and would keep that one. He will call back if pain worsens, but for now is going to try ibuprofen as suggested by [facility].

## 2016-01-25 NOTE — Telephone Encounter (Signed)
[place] Patient Name: Gregory Abbott DOB: Nov 11, 1945 Initial Comment Caller says has a mild pain in Lt side, under his rib cage, he has had this for about a week , wants an appt Nurse Assessment Nurse: Dimas Chyle, RN, Dellis Filbert Date/Time Eilene Ghazi Time): 01/25/2016 9:45:46 AM Confirm and document reason for call. If symptomatic, describe symptoms. You must click the next button to save text entered. ---Caller says has a mild pain in Lt side, under his rib cage, he has had this for about a week , wants an appt Has the patient traveled out of the country within the last 30 days? ---No Does the patient have any new or worsening symptoms? ---Yes Will a triage be completed? ---Yes Related visit to physician within the last 2 weeks? ---N/A Does the PT have any chronic conditions? (i.e. diabetes, asthma, etc.) ---Yes List chronic conditions. ---HTN Is this a behavioral health or substance abuse call? ---No Guidelines Guideline Title Affirmed Question Affirmed Notes Chest Pain [1] Chest pain(s) lasting a few seconds AND [2] persists > 3 days Final Disposition User See PCP When Office is Open (within 3 days) Dimas Chyle, RN, [employer] Referrals REFERRED TO PCP OFFICE Disagree/Comply: Leta Baptist

## 2016-01-27 ENCOUNTER — Encounter: Payer: Self-pay | Admitting: Family Medicine

## 2016-01-27 ENCOUNTER — Ambulatory Visit (INDEPENDENT_AMBULATORY_CARE_PROVIDER_SITE_OTHER): Payer: Medicare Other | Admitting: Family Medicine

## 2016-01-27 VITALS — BP 122/76 | HR 68 | Temp 98.3°F | Wt 188.8 lb

## 2016-01-27 DIAGNOSIS — S2341XA Sprain of ribs, initial encounter: Secondary | ICD-10-CM

## 2016-01-27 DIAGNOSIS — K219 Gastro-esophageal reflux disease without esophagitis: Secondary | ICD-10-CM | POA: Diagnosis not present

## 2016-01-27 DIAGNOSIS — IMO0002 Reserved for concepts with insufficient information to code with codable children: Secondary | ICD-10-CM | POA: Insufficient documentation

## 2016-01-27 DIAGNOSIS — S29019A Strain of muscle and tendon of unspecified wall of thorax, initial encounter: Secondary | ICD-10-CM | POA: Diagnosis not present

## 2016-01-27 MED ORDER — OMEPRAZOLE 40 MG PO CPDR: 40.0000 mg | DELAYED_RELEASE_CAPSULE | Freq: Every day | ORAL | 1 refills | Status: DC

## 2016-01-27 NOTE — Patient Instructions (Addendum)
Left rib cage pain likely from rib cage strain. Should continue to improve with time. You can use heating pad or rub area with vicks or bengay.  For worsening reflux, take 3 week course of omeprazole 40mg  once daily.  Watch for any more weight loss and let me know if that happens.  Careful with ibuprofen and the heartburn.   Head of bed elevated. Avoidance of citrus, fatty foods, chocolate, peppermint, and excessive alcohol, along with sodas, orange juice (acidic drinks) At least a few hours between dinner and bed, minimize naps after eating.

## 2016-01-27 NOTE — Assessment & Plan Note (Signed)
Anticipate self limited ribcage strain - supportive care as per instructions. Already noted improvement after taking ibuprofen today.

## 2016-01-27 NOTE — Assessment & Plan Note (Signed)
Anticipate worsening GERD - discussed caution with NSAID use, discussed food choices to help control symptoms, treat with 3 wk PPI course and update [place] if not improved with this.

## 2016-01-27 NOTE — Progress Notes (Signed)
BP 122/76   Pulse 68   Temp 98.3 F (36.8 C) (Oral)   Wt 188 lb 12 oz (85.6 kg)   BMI 27.47 kg/m    CC: chest pain, GERD Subjective:    Patient ID: Gregory Abbott, male    DOB: December 02, 1945, 70 y.o.   MRN: [id number]  HPI: Gregory Abbott is a 70 y.o. male presenting on 01/27/2016 for Chest Pain (left rib cage x 2 months) and Gastroesophageal Reflux (nausea after coffee x 2 months; burning has flared more)   Soreness to left lateral ribcage that started a few days ago. More noticeable after prolonged sitting. He did cut lawn for 2 hours with repetitive L arm motion on steering wheel. Today feeling some better. No chest pain, dyspnea with this.   Several month history of nausea with GERD sxs (burning in chest, acid reflux) after he drinks a cup of coffee in the mornings. Also noticed with sodas. Has had to cut down on coffee intake. Tums help relieve symptoms. No vomiting, bowel changes, appetite changes, early satiety or dysphagia. Weight has dropped 6 lbs - but pt not worried about this as states his weight tends to fluctuate 185-195lb.   Mildly constipated treated with prune juice and natural herbs.   Relevant past medical, surgical, family and social history reviewed and updated as indicated. Interim medical history since our last visit reviewed. Allergies and medications reviewed and updated. Current Outpatient Prescriptions on File Prior to Visit  Medication Sig  . albuterol (PROVENTIL HFA;VENTOLIN HFA) 108 (90 BASE) MCG/ACT inhaler Inhale 2 puffs into the lungs every 6 (six) hours as needed for wheezing or shortness of breath.  [name] aspirin EC 81 MG tablet Take 81 mg by mouth every other day.  [name] atenolol (TENORMIN) 50 MG tablet Take 1 tablet (50 mg total) by mouth 2 (two) times daily.  . clobetasol cream (TEMOVATE) AB-123456789 % Apply 1 application topically 2 (two) times daily. Apply to AA for hands, no more than 2 wks at a time  . clotrimazole-betamethasone (LOTRISONE) cream Apply 1  application topically 2 (two) times daily. To foot. No more than 3-4 weeks at a time.  [name] loratadine (CLARITIN) 10 MG tablet Take 10 mg by mouth daily as needed for allergies.  [name] losartan-hydrochlorothiazide (HYZAAR) 100-12.5 MG tablet TAKE ONE TABLET DAILY   No current facility-administered medications on file prior to visit.     Review of Systems Per HPI unless specifically indicated in ROS section     Objective:    BP 122/76   Pulse 68   Temp 98.3 F (36.8 C) (Oral)   Wt 188 lb 12 oz (85.6 kg)   BMI 27.47 kg/m   Wt Readings from Last 3 Encounters:  01/27/16 188 lb 12 oz (85.6 kg)  07/27/15 194 lb 12.8 oz (88.4 kg)  04/27/15 192 lb 12 oz (87.4 kg)    Physical Exam  Constitutional: He appears well-developed and well-nourished. No distress.  HENT:  Mouth/Throat: Oropharynx is clear and moist. No oropharyngeal exudate.  Neck: No thyromegaly present.  Cardiovascular: Normal rate, regular rhythm, normal heart sounds and intact distal pulses.   No murmur heard. Pulmonary/Chest: No respiratory distress. He has no wheezes. He has no rales. He exhibits no tenderness.  No reproducible tenderness to palpation   Abdominal: Soft. Normal appearance and bowel sounds are normal. He exhibits no distension and no mass. There is no hepatosplenomegaly. There is no tenderness. There is no rebound, no guarding and no CVA  tenderness.  Musculoskeletal: He exhibits no edema.  Skin: Skin is warm and dry. No rash noted.  Psychiatric: He has a normal mood and affect.  Nursing note and vitals reviewed.     Assessment & Plan:   Problem List Items Addressed This Visit    GERD (gastroesophageal reflux disease) - Primary    Anticipate worsening GERD - discussed caution with NSAID use, discussed food choices to help control symptoms, treat with 3 wk PPI course and update [place] if not improved with this.       Relevant Medications   omeprazole (PRILOSEC) 40 MG capsule   Sprain and strain of ribs     Anticipate self limited ribcage strain - supportive care as per instructions. Already noted improvement after taking ibuprofen today.       Other Visit Diagnoses   None.      Follow up plan: Return in about 3 months (around 04/28/2016), or if symptoms worsen or fail to improve, for medicare wellness visit.  Gregory Bush, MD

## 2016-03-24 ENCOUNTER — Other Ambulatory Visit: Payer: Self-pay | Admitting: Family Medicine

## 2016-03-26 ENCOUNTER — Other Ambulatory Visit: Payer: Self-pay | Admitting: Family Medicine

## 2016-03-28 NOTE — Telephone Encounter (Signed)
Per 7/26 3 week trial with # 30 x 1 rx given. Ok to refill? Next ov is 05/02/16

## 2016-05-01 ENCOUNTER — Other Ambulatory Visit: Payer: Self-pay | Admitting: Family Medicine

## 2016-05-01 DIAGNOSIS — I10 Essential (primary) hypertension: Secondary | ICD-10-CM

## 2016-05-01 DIAGNOSIS — Z1159 Encounter for screening for other viral diseases: Secondary | ICD-10-CM

## 2016-05-01 DIAGNOSIS — Z125 Encounter for screening for malignant neoplasm of prostate: Secondary | ICD-10-CM

## 2016-05-01 DIAGNOSIS — R739 Hyperglycemia, unspecified: Secondary | ICD-10-CM

## 2016-05-01 DIAGNOSIS — D751 Secondary polycythemia: Secondary | ICD-10-CM

## 2016-05-01 DIAGNOSIS — E782 Mixed hyperlipidemia: Secondary | ICD-10-CM

## 2016-05-01 DIAGNOSIS — E785 Hyperlipidemia, unspecified: Secondary | ICD-10-CM | POA: Insufficient documentation

## 2016-05-02 ENCOUNTER — Other Ambulatory Visit (INDEPENDENT_AMBULATORY_CARE_PROVIDER_SITE_OTHER): Payer: Medicare Other

## 2016-05-02 ENCOUNTER — Ambulatory Visit (INDEPENDENT_AMBULATORY_CARE_PROVIDER_SITE_OTHER): Payer: Medicare Other

## 2016-05-02 VITALS — BP 136/70 | HR 77 | Temp 97.8°F | Ht 69.5 in | Wt 194.0 lb

## 2016-05-02 DIAGNOSIS — Z1159 Encounter for screening for other viral diseases: Secondary | ICD-10-CM | POA: Diagnosis not present

## 2016-05-02 DIAGNOSIS — Z23 Encounter for immunization: Secondary | ICD-10-CM

## 2016-05-02 DIAGNOSIS — Z125 Encounter for screening for malignant neoplasm of prostate: Secondary | ICD-10-CM

## 2016-05-02 DIAGNOSIS — D751 Secondary polycythemia: Secondary | ICD-10-CM | POA: Diagnosis not present

## 2016-05-02 DIAGNOSIS — I10 Essential (primary) hypertension: Secondary | ICD-10-CM | POA: Diagnosis not present

## 2016-05-02 DIAGNOSIS — E782 Mixed hyperlipidemia: Secondary | ICD-10-CM | POA: Diagnosis not present

## 2016-05-02 DIAGNOSIS — Z0000 Encounter for general adult medical examination without abnormal findings: Secondary | ICD-10-CM | POA: Diagnosis not present

## 2016-05-02 DIAGNOSIS — R739 Hyperglycemia, unspecified: Secondary | ICD-10-CM

## 2016-05-02 LAB — COMPREHENSIVE METABOLIC PANEL
ALBUMIN: 4.1 g/dL (ref 3.5–5.2)
ALT: 15 U/L (ref 0–53)
AST: 13 U/L (ref 0–37)
Alkaline Phosphatase: 53 U/L (ref 39–117)
BUN: 18 mg/dL (ref 6–23)
CALCIUM: 9.9 mg/dL (ref 8.4–10.5)
CHLORIDE: 101 meq/L (ref 96–112)
CO2: 30 mEq/L (ref 19–32)
Creatinine, Ser: 1.13 mg/dL (ref 0.40–1.50)
GFR: 82.3 mL/min (ref >60.00)
Glucose, Bld: 138 mg/dL — ABNORMAL HIGH (ref 70–99)
POTASSIUM: 4.1 meq/L (ref 3.5–5.1)
SODIUM: 139 meq/L (ref 135–145)
Total Bilirubin: 1 mg/dL (ref 0.2–1.2)
Total Protein: 7.5 g/dL (ref 6.0–8.3)

## 2016-05-02 LAB — CBC WITH DIFFERENTIAL/PLATELET
BASOS ABS: 0.1 10*3/uL (ref 0.0–0.1)
Basophils Relative: 0.5 % (ref 0.0–3.0)
EOS ABS: 1.4 10*3/uL — AB (ref 0.0–0.7)
Eosinophils Relative: 13.1 % — ABNORMAL HIGH (ref 0.0–5.0)
HCT: 50.3 % (ref 39.0–52.0)
HEMOGLOBIN: 16.7 g/dL (ref 13.0–17.0)
Lymphocytes Relative: 41.9 % (ref 12.0–46.0)
Lymphs Abs: 4.6 10*3/uL — ABNORMAL HIGH (ref 0.7–4.0)
MCHC: 33.2 g/dL (ref 30.0–36.0)
MCV: 93.5 fl (ref 78.0–100.0)
MONO ABS: 1 10*3/uL (ref 0.1–1.0)
Monocytes Relative: 9 % (ref 3.0–12.0)
Neutro Abs: 3.9 10*3/uL (ref 1.4–7.7)
Neutrophils Relative %: 35.5 % — ABNORMAL LOW (ref 43.0–77.0)
Platelets: 287 10*3/uL (ref 150.0–400.0)
RBC: 5.38 Mil/uL (ref 4.22–5.81)
RDW: 14.2 % (ref 11.5–15.5)
WBC: 10.9 10*3/uL — AB (ref 4.0–10.5)

## 2016-05-02 LAB — LIPID PANEL
CHOLESTEROL: 223 mg/dL — AB (ref 0–200)
HDL: 39.1 mg/dL (ref >39.00)
LDL CALC: 149 mg/dL — AB (ref 0–99)
NonHDL: 183.56
TRIGLYCERIDES: 174 mg/dL — AB (ref 0.0–149.0)
Total CHOL/HDL Ratio: 6
VLDL: 34.8 mg/dL (ref 0.0–40.0)

## 2016-05-02 LAB — PSA, MEDICARE: PSA: 2.01 ng/mL (ref 0.10–4.00)

## 2016-05-02 LAB — HEMOGLOBIN A1C: HEMOGLOBIN A1C: 6.8 % — AB (ref 4.6–6.5)

## 2016-05-02 LAB — TSH: TSH: 0.13 u[IU]/mL — AB (ref 0.35–4.50)

## 2016-05-02 NOTE — Addendum Note (Signed)
Addended by: Daralene Milch C on: 05/02/2016 09:10 AM   Modules accepted: Orders

## 2016-05-02 NOTE — Progress Notes (Signed)
PCP notes:   Health maintenance:  Hep C screening - completed Flu vaccine - given by lab Shingles -  Postponed/insurance  Abnormal screenings:   Hearing - failed  Patient concerns:   Pt is still experiencing rash and itching to upper body.   Nurse concerns:  None  Next PCP appt:   05/10/16 @ 1130

## 2016-05-02 NOTE — Patient Instructions (Signed)
[name] , Thank you for taking time to come for your Medicare Wellness Visit. I appreciate your ongoing commitment to your health goals. Please review the following plan we discussed and let me know if I can assist you in the future.   These are the goals we discussed: Goals    . Increase physical activity          Starting 05/02/2016, I will continue to walk at least 30 min 4-5 days per week.        This is a list of the screening recommended for you and due dates:  Health Maintenance  Topic Date Due  . Shingles Vaccine  05/02/2017*  . Colon Cancer Screening  05/07/2018  . DTaP/Tdap/Td vaccine (2 - Td) 01/30/2021  . Tetanus Vaccine  01/30/2021  . Flu Shot  Completed  .  Hepatitis C: One time screening is recommended by Center for Disease Control  (CDC) for  adults born from [birth year] through 1966.   Completed  . Pneumonia vaccines  Completed  *Topic was postponed. The date shown is not the original due date.   Preventive Care for Adults  A healthy lifestyle and preventive care can promote health and wellness. Preventive health guidelines for adults include the following key practices.  . A routine yearly physical is a good way to check with your health care provider about your health and preventive screening. It is a chance to share any concerns and updates on your health and to receive a thorough exam.  . Visit your dentist for a routine exam and preventive care every 6 months. Brush your teeth twice a day and floss once a day. Good oral hygiene prevents tooth decay and gum disease.  . The frequency of eye exams is based on your age, health, family medical history, use  of contact lenses, and other factors. Follow your health care provider's ecommendations for frequency of eye exams.  . Eat a healthy diet. Foods like vegetables, fruits, whole grains, low-fat dairy products, and lean protein foods contain the nutrients you need without too many calories. Decrease your intake of  foods high in solid fats, added sugars, and salt. Eat the right amount of calories for you. Get information about a proper diet from your health care provider, if necessary.  . Regular physical exercise is one of the most important things you can do for your health. Most adults should get at least 150 minutes of moderate-intensity exercise (any activity that increases your heart rate and causes you to sweat) each week. In addition, most adults need muscle-strengthening exercises on 2 or more days a week.  Silver Sneakers may be a benefit available to you. To determine eligibility, you may visit the website: www.silversneakers.com or contact program at [phone] Mon-Fri between 8AM-8PM.   . Maintain a healthy weight. The body mass index (BMI) is a screening tool to identify possible weight problems. It provides an estimate of body fat based on height and weight. Your health care provider can find your BMI and can help you achieve or maintain a healthy weight.   For adults 20 years and older: ? A BMI below 18.5 is considered underweight. ? A BMI of 18.5 to 24.9 is normal. ? A BMI of 25 to 29.9 is considered overweight. ? A BMI of 30 and above is considered obese.   . Maintain normal blood lipids and cholesterol levels by exercising and minimizing your intake of saturated fat. Eat a balanced diet with plenty of fruit  and vegetables. Blood tests for lipids and cholesterol should begin at age 27 and be repeated every 5 years. If your lipid or cholesterol levels are high, you are over 50, or you are at high risk for heart disease, you may need your cholesterol levels checked more frequently. Ongoing high lipid and cholesterol levels should be treated with medicines if diet and exercise are not working.  . If you smoke, find out from your health care provider how to quit. If you do not use tobacco, please do not start.  . If you choose to drink alcohol, please do not consume more than 2 drinks per  day. One drink is considered to be 12 ounces (355 mL) of beer, 5 ounces (148 mL) of wine, or 1.5 ounces (44 mL) of liquor.  . If you are 40-69 years old, ask your health care provider if you should take aspirin to prevent strokes.  . Use sunscreen. Apply sunscreen liberally and repeatedly throughout the day. You should seek shade when your shadow is shorter than you. Protect yourself by wearing long sleeves, pants, a wide-brimmed hat, and sunglasses year round, whenever you are outdoors.  . Once a month, do a whole body skin exam, using a mirror to look at the skin on your back. Tell your health care provider of new moles, moles that have irregular borders, moles that are larger than a pencil eraser, or moles that have changed in shape or color.

## 2016-05-02 NOTE — Progress Notes (Signed)
Pre visit review using our clinic review tool, if applicable. No additional management support is needed unless otherwise documented below in the visit note. 

## 2016-05-02 NOTE — Progress Notes (Signed)
Subjective:   Gregory Abbott is a 70 y.o. male who presents for Medicare Annual/Subsequent preventive examination.  Review of Systems:  N/A Cardiac Risk Factors include: advanced age (>63men, >42 women);male gender;hypertension     Objective:    Vitals: BP 136/70 (BP Location: Right Arm, Patient Position: Sitting, Cuff Size: Normal) Comment: AM BP medication not taken  Pulse 77   Temp 97.8 F (36.6 C) (Oral)   Ht 5' 9.5" (1.765 m) Comment: no shoes  Wt 194 lb (88 kg)   SpO2 95%   BMI 28.24 kg/m   Body mass index is 28.24 kg/m.  Tobacco History  Smoking Status  . Former Smoker  . Packs/day: 0.25  . Types: Cigarettes  . Quit date: 11/06/2013  Smokeless Tobacco  . Never Used     Counseling given: No   Past Medical History:  Diagnosis Date  . Blood transfusion without reported diagnosis   . BPH (benign prostatic hypertrophy)   . Cataract   . Childhood asthma   . COPD (chronic obstructive pulmonary disease) ([place])   . Eczema   . Ex-smoker    40 PY hx  . GERD (gastroesophageal reflux disease)   . History of chicken pox   . Hypertension   . Lower back pain    h/o herniated and bulging discs lower back  . Osteoarthritis of neck   . Personal history of colonic polyps 05/07/2013   resected not retrieved  . PUD (peptic ulcer disease)   . S/P splenectomy    partial  . Shortness of breath    Past Surgical History:  Procedure Laterality Date  . AAA screen  04/2013   neg for AAA  . CATARACT EXTRACTION Left 05/2013   [facility]  . CATARACT EXTRACTION W/PHACO Right 10/06/2015   Procedure: CATARACT EXTRACTION PHACO AND INTRAOCULAR LENS PLACEMENT (IOC);  Surgeon: Birder Robson, MD;  Location: [facility] ORS;  Service: Ophthalmology;  Laterality: Right;  [place] AP% CDE fluid pack lot 3 [id number] H exp 04/02/2017  . COLONOSCOPY  2009  . COLONOSCOPY  05/2013   1 polyp, int/ext hemorrhoids Carlean Purl)  . KNEE ARTHROSCOPY    . lower extremity arterial doppler  2013   WNL  .  SPLENECTOMY, PARTIAL  1998   fall down stairs   Family History  Problem Relation Age of Onset  . Diabetes Mother   . Hypertension Mother   . Hyperlipidemia Mother   . Heart disease Mother   . Stroke Father   . Colon cancer Neg Hx   . Rectal cancer Neg Hx   . Stomach cancer Neg Hx   . Esophageal cancer Neg Hx    History  Sexual Activity  . Sexual activity: Yes    Outpatient Encounter Prescriptions as of 05/02/2016  Medication Sig  . aspirin EC 81 MG tablet Take 81 mg by mouth every other day.  [name] atenolol (TENORMIN) 50 MG tablet TAKE 1 TABLET (50 MG TOTAL) BY MOUTH 2 (TWO) TIMES DAILY. (Patient taking differently: Take 100 mg by mouth daily after breakfast. )  . clobetasol cream (TEMOVATE) [id number] % Apply 1 application topically 2 (two) times daily. Apply to AA for hands, no more than 2 wks at a time  . DiphenhydrAMINE HCl (BENADRYL ALLERGY PO) Take 1 tablet by mouth daily as needed.  . loratadine (CLARITIN) 10 MG tablet Take 10 mg by mouth daily as needed for allergies.  [name] losartan-hydrochlorothiazide (HYZAAR) 100-12.5 MG tablet TAKE ONE TABLET DAILY  . omeprazole (PRILOSEC) 40 MG  capsule TAKE 1 CAPSULE (40 MG TOTAL) BY MOUTH DAILY.  [name] albuterol (PROVENTIL HFA;VENTOLIN HFA) 108 (90 BASE) MCG/ACT inhaler Inhale 2 puffs into the lungs every 6 (six) hours as needed for wheezing or shortness of breath. (Patient not taking: Reported on 05/02/2016)  . clotrimazole-betamethasone (LOTRISONE) cream Apply 1 application topically 2 (two) times daily. To foot. No more than 3-4 weeks at a time. (Patient not taking: Reported on 05/02/2016)   No facility-administered encounter medications on file as of 05/02/2016.     Activities of Daily Living In your present state of health, do you have any difficulty performing the following activities: 05/02/2016  Hearing? N  Vision? N  Difficulty concentrating or making decisions? N  Walking or climbing stairs? N  Dressing or bathing? N  Doing errands,  shopping? N  Preparing Food and eating ? N  Using the Toilet? N  Managing your Medications? N  Managing your Finances? N  Housekeeping or managing your Housekeeping? N  Some recent data might be hidden    Patient Care Team: Ria Bush, MD as PCP - General (Family Medicine) Birder Robson, MD as Referring Physician (Ophthalmology)   Assessment:     Hearing Screening   125Hz  250Hz  500Hz  1000Hz  2000Hz  3000Hz  4000Hz  6000Hz  8000Hz   Right ear:   40 0 0  0    Left ear:   40 0 0  0    Vision Screening Comments: Last vision exam with Dr. Merla Riches in March 2017   Exercise Activities and Dietary recommendations Current Exercise Habits: Home exercise routine, Type of exercise: walking, Time (Minutes): 35, Frequency (Times/Week): 5, Weekly Exercise (Minutes/Week): 175, Intensity: Mild, Exercise limited by: None identified  Goals    . Increase physical activity          Starting 05/02/2016, I will continue to walk at least 30 min 4-5 days per week.       Fall Risk Fall Risk  05/02/2016 04/03/2015 02/01/2013  Falls in the past year? No No No   Depression Screen PHQ 2/9 Scores 05/02/2016 04/03/2015 02/01/2013  PHQ - 2 Score 0 0 0    Cognitive Function MMSE - Mini Mental State Exam 05/02/2016  Orientation to time 5  Orientation to Place 5  Registration 3  Attention/ Calculation 0  Recall 3  Language- name 2 objects 0  Language- repeat 1  Language- follow 3 step command 3  Language- read & follow direction 0  Write a sentence 0  Copy design 0  Total score 20     PLEASE NOTE: A Mini-Cog screen was completed. Maximum score is 20. A value of 0 denotes this part of [place] MMSE was not completed or the patient failed this part of the Mini-Cog screening.   Mini-Cog Screening Orientation to Time - Max 5 pts Orientation to Place - Max 5 pts Registration - Max 3 pts Recall - Max 3 pts Language Repeat - Max 1 pts Language Follow 3 Step Command - Max 3 pts     Immunization  History  Administered Date(s) Administered  . Influenza,inj,Quad PF,36+ Mos 03/30/2015, 05/02/2016  . Influenza-Unspecified 04/03/2014, 05/04/2014  . Pneumococcal Conjugate-13 11/14/2013  . Pneumococcal Polysaccharide-23 04/03/2015  . Tdap 01/31/2011   Screening Tests Health Maintenance  Topic Date Due  . ZOSTAVAX  05/02/2017 (Originally 06/17/2005)  . COLONOSCOPY  05/07/2018  . DTaP/Tdap/Td (2 - Td) 01/30/2021  . TETANUS/TDAP  01/30/2021  . INFLUENZA VACCINE  Completed  . Hepatitis C Screening  Completed  . PNA vac  Low Risk Adult  Completed      Plan:     I have personally reviewed and addressed the Medicare Annual Wellness questionnaire and have noted the following in the patient's chart:  A. Medical and social history B. Use of alcohol, tobacco or illicit drugs  C. Current medications and supplements D. Functional ability and status E.  Nutritional status F.  Physical activity G. Advance directives H. List of other physicians I.  Hospitalizations, surgeries, and ER visits in previous 12 months J.  [place] to include hearing, vision, cognitive, depression L. Referrals and appointments - none  In addition, I have reviewed and discussed with patient certain preventive protocols, quality metrics, and best practice recommendations. A written personalized care plan for preventive services as well as general preventive health recommendations were provided to patient.  See attached scanned questionnaire for additional information.   Signed,   Lindell Noe, MHA, BS, LPN Health Coach

## 2016-05-03 LAB — HEPATITIS C ANTIBODY: HCV AB: NEGATIVE

## 2016-05-06 ENCOUNTER — Encounter: Payer: Medicare Other | Admitting: Family Medicine

## 2016-05-08 NOTE — Progress Notes (Signed)
I reviewed health advisor's note, was available for consultation, and agree with documentation and plan.  

## 2016-05-10 ENCOUNTER — Other Ambulatory Visit: Payer: Self-pay | Admitting: Family Medicine

## 2016-05-10 ENCOUNTER — Ambulatory Visit (INDEPENDENT_AMBULATORY_CARE_PROVIDER_SITE_OTHER): Payer: Medicare Other | Admitting: Family Medicine

## 2016-05-10 ENCOUNTER — Encounter: Payer: Self-pay | Admitting: Family Medicine

## 2016-05-10 VITALS — BP 140/80 | HR 76 | Temp 97.9°F | Wt 194.0 lb

## 2016-05-10 DIAGNOSIS — K219 Gastro-esophageal reflux disease without esophagitis: Secondary | ICD-10-CM

## 2016-05-10 DIAGNOSIS — J449 Chronic obstructive pulmonary disease, unspecified: Secondary | ICD-10-CM

## 2016-05-10 DIAGNOSIS — R21 Rash and other nonspecific skin eruption: Secondary | ICD-10-CM

## 2016-05-10 DIAGNOSIS — I10 Essential (primary) hypertension: Secondary | ICD-10-CM

## 2016-05-10 DIAGNOSIS — E782 Mixed hyperlipidemia: Secondary | ICD-10-CM

## 2016-05-10 DIAGNOSIS — R946 Abnormal results of thyroid function studies: Secondary | ICD-10-CM | POA: Diagnosis not present

## 2016-05-10 DIAGNOSIS — R7989 Other specified abnormal findings of blood chemistry: Secondary | ICD-10-CM

## 2016-05-10 DIAGNOSIS — E0590 Thyrotoxicosis, unspecified without thyrotoxic crisis or storm: Secondary | ICD-10-CM | POA: Insufficient documentation

## 2016-05-10 DIAGNOSIS — Z7189 Other specified counseling: Secondary | ICD-10-CM

## 2016-05-10 DIAGNOSIS — E119 Type 2 diabetes mellitus without complications: Secondary | ICD-10-CM

## 2016-05-10 LAB — T4, FREE: Free T4: 1.48 ng/dL (ref 0.60–1.60)

## 2016-05-10 LAB — TSH: TSH: 0.2 u[IU]/mL — ABNORMAL LOW (ref 0.35–4.50)

## 2016-05-10 MED ORDER — OMEPRAZOLE 40 MG PO CPDR: 40.0000 mg | DELAYED_RELEASE_CAPSULE | Freq: Every day | ORAL | 3 refills | Status: DC

## 2016-05-10 MED ORDER — LORATADINE 10 MG PO TABS: 10.0000 mg | ORAL_TABLET | Freq: Every day | ORAL | Status: AC

## 2016-05-10 MED ORDER — ATENOLOL 100 MG PO TABS: 100.0000 mg | ORAL_TABLET | Freq: Every day | ORAL | 3 refills | Status: DC

## 2016-05-10 MED ORDER — ALBUTEROL SULFATE HFA 108 (90 BASE) MCG/ACT IN AERS: 2.0000 | INHALATION_SPRAY | Freq: Four times a day (QID) | RESPIRATORY_TRACT | 3 refills | Status: DC | PRN

## 2016-05-10 NOTE — Patient Instructions (Addendum)
Call your insurance about the shingles shot to see if it is covered or how much it would cost and where is cheaper (here or pharmacy).  If you want to receive here, call for nurse visit.  Thyroid may be overactive. Recheck thyroid today - we will be in touch with results. If thyroid levels return normal, we will change blood pressure medicines around.  Sugar was too high - now in diabetes range. Decrease added sugars and sweetened beverages. Return in 3 months for f/u diabetes visit.   Diabetes Mellitus and Food It is important for you to manage your blood sugar (glucose) level. Your blood glucose level can be greatly affected by what you eat. Eating healthier foods in the appropriate amounts throughout the day at about the same time each day will help you control your blood glucose level. It can also help slow or prevent worsening of your diabetes mellitus. Healthy eating may even help you improve the level of your blood pressure and reach or maintain a healthy weight.  General recommendations for healthful eating and cooking habits include:  Eating meals and snacks regularly. Avoid going long periods of time without eating to lose weight.  Eating a diet that consists mainly of plant-based foods, such as fruits, vegetables, nuts, legumes, and whole grains.  Using low-heat cooking methods, such as baking, instead of high-heat cooking methods, such as deep frying. Work with your dietitian to make sure you understand how to use the Nutrition Facts information on food labels. HOW CAN FOOD AFFECT ME? Carbohydrates Carbohydrates affect your blood glucose level more than any other type of food. Your dietitian will help you determine how many carbohydrates to eat at each meal and teach you how to count carbohydrates. Counting carbohydrates is important to keep your blood glucose at a healthy level, especially if you are using insulin or taking certain medicines for diabetes mellitus. Alcohol Alcohol can  cause sudden decreases in blood glucose (hypoglycemia), especially if you use insulin or take certain medicines for diabetes mellitus. Hypoglycemia can be a life-threatening condition. Symptoms of hypoglycemia (sleepiness, dizziness, and disorientation) are similar to symptoms of having too much alcohol.  If your health care provider has given you approval to drink alcohol, do so in moderation and use the following guidelines:  Women should not have more than one drink per day, and men should not have more than two drinks per day. One drink is equal to:  12 oz of beer.  5 oz of wine.  1 oz of hard liquor.  Do not drink on an empty stomach.  Keep yourself hydrated. Have water, diet soda, or unsweetened iced tea.  Regular soda, juice, and other mixers might contain a lot of carbohydrates and should be counted. WHAT FOODS ARE NOT RECOMMENDED? As you make food choices, it is important to remember that all foods are not the same. Some foods have fewer nutrients per serving than other foods, even though they might have the same number of calories or carbohydrates. It is difficult to get your body what it needs when you eat foods with fewer nutrients. Examples of foods that you should avoid that are high in calories and carbohydrates but low in nutrients include:  Trans fats (most processed foods list trans fats on the Nutrition Facts label).  Regular soda.  Juice.  Candy.  Sweets, such as cake, pie, doughnuts, and cookies.  Fried foods. WHAT FOODS CAN I EAT? Eat nutrient-rich foods, which will nourish your body and keep you  healthy. The food you should eat also will depend on several factors, including:  The calories you need.  The medicines you take.  Your weight.  Your blood glucose level.  Your blood pressure level.  Your cholesterol level. You should eat a variety of foods, including:  Protein.  Lean cuts of meat.  Proteins low in saturated fats, such as fish, egg  whites, and beans. Avoid processed meats.  Fruits and vegetables.  Fruits and vegetables that may help control blood glucose levels, such as apples, mangoes, and yams.  Dairy products.  Choose fat-free or low-fat dairy products, such as milk, yogurt, and cheese.  Grains, bread, pasta, and rice.  Choose whole grain products, such as multigrain bread, whole oats, and brown rice. These foods may help control blood pressure.  Fats.  Foods containing healthful fats, such as nuts, avocado, olive oil, canola oil, and fish. DOES EVERYONE WITH DIABETES MELLITUS HAVE THE SAME MEAL PLAN? Because every person with diabetes mellitus is different, there is not one meal plan that works for everyone. It is very important that you meet with a dietitian who will help you create a meal plan that is just right for you.   This information is not intended to replace advice given to you by your health care provider. Make sure you discuss any questions you have with your health care provider.   Document Released: 03/17/2005 Document Revised: 07/11/2014 Document Reviewed: 05/17/2013 Elsevier Interactive Patient Education [employer].

## 2016-05-10 NOTE — Assessment & Plan Note (Addendum)
Advanced directives - Daughter is be HCPOA - brings outdated form today. New advanced directive packet provided.

## 2016-05-10 NOTE — Assessment & Plan Note (Signed)
New diagnosis reviewed with patient. rec cut out added sugars, sweetened beverages from diet. Recheck at 27mo DM f/u visit. Pt agrees with plan.

## 2016-05-10 NOTE — Progress Notes (Signed)
Pre visit review using our clinic review tool, if applicable. No additional management support is needed unless otherwise documented below in the visit note. 

## 2016-05-10 NOTE — Assessment & Plan Note (Signed)
Anticipate uncontrolled hyperglycemia contributing. Will work towards better glycemic control, then reassess.

## 2016-05-10 NOTE — Assessment & Plan Note (Signed)
No hyperthyroid sxs other than new rash.  TFTs today.

## 2016-05-10 NOTE — Assessment & Plan Note (Signed)
Reviewed trial PPI QOD Dosing

## 2016-05-10 NOTE — Assessment & Plan Note (Addendum)
S/p eval by 2 dermatologists over last few years. ?drug rash vs atopic dermatitis. Anticipate more drug rash; now with abnormal thyroid function thyroid disease is a possibility. Will further eval with TFTs. If unrevealing, consider trial off antihypertensives one at a time, start with losartan.

## 2016-05-10 NOTE — Progress Notes (Signed)
BP 140/80   Pulse 76   Temp 97.9 F (36.6 C) (Oral)   Wt 194 lb (88 kg)   BMI 28.24 kg/m    CC: medicare wellness visit Subjective:    Patient ID: Gregory Abbott, male    DOB: [birth year], 70 y.o.   MRN: [id number]  HPI: Gregory Abbott is a 70 y.o. male presenting on 05/10/2016 for Annual Exam   Saw Katha Cabal last week for medicare wellness visit, note reviewed.   Ongoing pruritic rash - dx as drug rash by Dr Allyson Sabal (2015) then possible atopic dermatitis by Dr Nehemiah Massed (2017), treated with steroid cream with temporary relief. Wonders about losartan.   Ongoing dyspnea/congestion noted when walking outdoors or cutting grass. He does regularly wear mask. Uses albuterol PRN walks in park.   GERD - stable on omeprazole 40mg  daily.   Preventative: COLONOSCOPY Date: 05/2013 1 polyp, int/ext hemorrhoids. Small transverse polyp removed but not recovered - consider rpt 2019 Carlean Purl) Prostate cancer screening - discussed, would like screening today. Flu shot - yearly Tdap - 2012 prevnar 11/2013, pneumovax 2016 Shingle shot - discussed, will check with insurance Advanced directives - Daughter is be HCPOA - brings outdated form today. New advanced directive packet provided.  Seat belt use discussed Sunscreen use discussed. No changing moles on skin. Ex smoker - quit 2015 Alcohol - rare (backed off due to itchy rash)  Lives with wife, no pets Occupation: retired, Engineer, mining [employer]: Degree Activity: housework, no regular exercise Diet: good water, fruits/vegetables daily  Relevant past medical, surgical, family and social history reviewed and updated as indicated. Interim medical history since our last visit reviewed. Allergies and medications reviewed and updated. Current Outpatient Prescriptions on File Prior to Visit  Medication Sig  . aspirin EC 81 MG tablet Take 81 mg by mouth every other day.  . clobetasol cream (TEMOVATE) [id number] % Apply 1 application topically 2 (two) times daily.  Apply to AA for hands, no more than 2 wks at a time  . DiphenhydrAMINE HCl (BENADRYL ALLERGY PO) Take 1 tablet by mouth daily as needed.   No current facility-administered medications on file prior to visit.     Review of Systems Per HPI unless specifically indicated in ROS section     Objective:    BP 140/80   Pulse 76   Temp 97.9 F (36.6 C) (Oral)   Wt 194 lb (88 kg)   BMI 28.24 kg/m   Wt Readings from Last 3 Encounters:  05/10/16 194 lb (88 kg)  05/02/16 194 lb (88 kg)  01/27/16 188 lb 12 oz (85.6 kg)    Physical Exam  Constitutional: He is oriented to person, place, and time. He appears well-developed and well-nourished. No distress.  HENT:  Head: Normocephalic and atraumatic.  Right Ear: Hearing, tympanic membrane, external ear and ear canal normal.  Left Ear: Hearing, tympanic membrane, external ear and ear canal normal.  Nose: Nose normal.  Mouth/Throat: Uvula is midline, oropharynx is clear and moist and mucous membranes are normal. No oropharyngeal exudate, posterior oropharyngeal edema or posterior oropharyngeal erythema.  Eyes: Conjunctivae and EOM are normal. Pupils are equal, round, and reactive to light. No scleral icterus.  Neck: Normal range of motion. Neck supple.  Cardiovascular: Normal rate, regular rhythm, normal heart sounds and intact distal pulses.   No murmur heard. Pulses:      Radial pulses are 2+ on the right side, and 2+ on the left side.  Pulmonary/Chest: Effort normal and breath sounds  normal. No respiratory distress. He has no wheezes. He has no rales.  Abdominal: Soft. Bowel sounds are normal. He exhibits no distension and no mass. There is no tenderness. There is no rebound and no guarding.  Genitourinary: Rectum normal and prostate normal. Rectal exam shows no external hemorrhoid, no internal hemorrhoid, no fissure, no mass, no tenderness and anal tone normal. Prostate is not enlarged (15gm) and not tender.  Musculoskeletal: Normal range of  motion. He exhibits no edema.  Lymphadenopathy:    He has no cervical adenopathy.  Neurological: He is alert and oriented to person, place, and time.  CN grossly intact, station and gait intact  Skin: Skin is warm and dry. Rash noted.  Pruritic papular rash on upper sides and arms  Psychiatric: He has a normal mood and affect. His behavior is normal. Judgment and thought content normal.  Nursing note and vitals reviewed.  Results for orders placed or performed in visit on 05/02/16  Lipid panel  Result Value Ref Range   Cholesterol 223 (H) 0 - 200 mg/dL   Triglycerides 174.0 (H) 0.0 - 149.0 mg/dL   HDL 39.10 >39.00 mg/dL   VLDL 34.8 0.0 - 40.0 mg/dL   LDL Cholesterol 149 (H) 0 - 99 mg/dL   Total CHOL/HDL Ratio 6    NonHDL 183.56   Comprehensive metabolic panel  Result Value Ref Range   Sodium 139 135 - 145 mEq/L   Potassium 4.1 3.5 - 5.1 mEq/L   Chloride 101 96 - 112 mEq/L   CO2 30 19 - 32 mEq/L   Glucose, Bld 138 (H) 70 - 99 mg/dL   BUN 18 6 - 23 mg/dL   Creatinine, Ser 1.13 0.40 - 1.50 mg/dL   Total Bilirubin 1.0 0.2 - 1.2 mg/dL   Alkaline Phosphatase 53 39 - 117 U/L   AST 13 0 - 37 U/L   ALT 15 0 - 53 U/L   Total Protein 7.5 6.0 - 8.3 g/dL   Albumin 4.1 3.5 - 5.2 g/dL   Calcium 9.9 8.4 - 10.5 mg/dL   GFR 82.30 >60.00 mL/min  PSA, Medicare  Result Value Ref Range   PSA 2.01 0.10 - 4.00 ng/ml  CBC with Differential/Platelet  Result Value Ref Range   WBC 10.9 (H) 4.0 - 10.5 K/uL   RBC 5.38 4.22 - 5.81 Mil/uL   Hemoglobin 16.7 13.0 - 17.0 g/dL   HCT 50.3 39.0 - 52.0 %   MCV 93.5 78.0 - 100.0 fl   MCHC 33.2 30.0 - 36.0 g/dL   RDW 14.2 11.5 - 15.5 %   Platelets 287.0 150.0 - 400.0 K/uL   Neutrophils Relative % 35.5 (L) 43.0 - 77.0 %   Lymphocytes Relative 41.9 12.0 - 46.0 %   Monocytes Relative 9.0 3.0 - 12.0 %   Eosinophils Relative 13.1 (H) 0.0 - 5.0 %   Basophils Relative 0.5 0.0 - 3.0 %   Neutro Abs 3.9 1.4 - 7.7 K/uL   Lymphs Abs 4.6 (H) 0.7 - 4.0 K/uL    Monocytes Absolute 1.0 0.1 - 1.0 K/uL   Eosinophils Absolute 1.4 (H) 0.0 - 0.7 K/uL   Basophils Absolute 0.1 0.0 - 0.1 K/uL  TSH  Result Value Ref Range   TSH 0.13 (L) 0.35 - 4.50 uIU/mL  Hepatitis C antibody  Result Value Ref Range   HCV Ab NEGATIVE NEGATIVE  Hemoglobin A1c  Result Value Ref Range   Hgb A1c MFr Bld 6.8 (H) 4.6 - 6.5 %  Assessment & Plan:   Problem List Items Addressed This Visit    Advanced care planning/counseling discussion    Advanced directives - Daughter is be HCPOA - brings outdated form today. New advanced directive packet provided.       Controlled type 2 diabetes mellitus without complication ([place])    New diagnosis reviewed with patient. rec cut out added sugars, sweetened beverages from diet. Recheck at 81mo DM f/u visit. Pt agrees with plan.      COPD (chronic obstructive pulmonary disease) (HCC)    Albuterol refilled. Consider daily chronic med, previously unable to afford foradil.      Relevant Medications   albuterol (PROVENTIL HFA;VENTOLIN HFA) 108 (90 Base) MCG/ACT inhaler   loratadine (CLARITIN) 10 MG tablet   GERD (gastroesophageal reflux disease)    Reviewed trial PPI QOD Dosing      Relevant Medications   omeprazole (PRILOSEC) 40 MG capsule   Hypertension    Chronic, stable. Pt worried about losartan contribution to rash - see below. May make changes pending today's labs.      Relevant Medications   atenolol (TENORMIN) 100 MG tablet   Low serum thyroid stimulating hormone (TSH)    No hyperthyroid sxs other than new rash.  TFTs today.      Relevant Orders   TSH   T3   T4, free   Mixed hyperlipidemia    Anticipate uncontrolled hyperglycemia contributing. Will work towards better glycemic control, then reassess.      Relevant Medications   atenolol (TENORMIN) 100 MG tablet   Skin rash - Primary    S/p eval by 2 dermatologists over last few years. ?drug rash vs atopic dermatitis. Anticipate more drug rash; now with  abnormal thyroid function thyroid disease is a possibility. Will further eval with TFTs. If unrevealing, consider trial off antihypertensives one at a time, start with losartan.          Follow up plan: Return in about 3 months (around 08/10/2016) for follow up visit.  Ria Bush, MD

## 2016-05-10 NOTE — Assessment & Plan Note (Signed)
Chronic, stable. Pt worried about losartan contribution to rash - see below. May make changes pending today's labs.

## 2016-05-10 NOTE — Assessment & Plan Note (Addendum)
Albuterol refilled. Consider daily chronic med, previously unable to afford foradil.

## 2016-05-11 LAB — T3: T3, Total: 133 ng/dL (ref 76–181)

## 2016-05-14 ENCOUNTER — Other Ambulatory Visit: Payer: Self-pay | Admitting: Family Medicine

## 2016-05-14 MED ORDER — HYDROCHLOROTHIAZIDE 25 MG PO TABS: 25.0000 mg | ORAL_TABLET | Freq: Every day | ORAL | 3 refills | Status: DC

## 2016-05-14 MED ORDER — AMLODIPINE BESYLATE 5 MG PO TABS
5.0000 mg | ORAL_TABLET | Freq: Every day | ORAL | 3 refills | Status: DC
Start: 2016-05-14 — End: 2016-05-17

## 2016-05-17 ENCOUNTER — Other Ambulatory Visit: Payer: Self-pay | Admitting: *Deleted

## 2016-05-17 MED ORDER — AMLODIPINE BESYLATE 5 MG PO TABS: 5.0000 mg | ORAL_TABLET | Freq: Every day | ORAL | 3 refills | Status: DC

## 2016-05-17 MED ORDER — HYDROCHLOROTHIAZIDE 25 MG PO TABS: 25.0000 mg | ORAL_TABLET | Freq: Every day | ORAL | 3 refills | Status: DC

## 2016-05-19 ENCOUNTER — Emergency Department (HOSPITAL_COMMUNITY)
Admission: EM | Admit: 2016-05-19 | Discharge: 2016-05-19 | Disposition: A | Discharge status: Home / Self Care | Payer: Medicare Other | Attending: Emergency Medicine | Admitting: Emergency Medicine

## 2016-05-19 ENCOUNTER — Emergency Department (HOSPITAL_COMMUNITY): Payer: Medicare Other

## 2016-05-19 ENCOUNTER — Encounter (HOSPITAL_COMMUNITY): Payer: Self-pay | Admitting: Emergency Medicine

## 2016-05-19 DIAGNOSIS — Z87891 Personal history of nicotine dependence: Secondary | ICD-10-CM | POA: Insufficient documentation

## 2016-05-19 DIAGNOSIS — J449 Chronic obstructive pulmonary disease, unspecified: Secondary | ICD-10-CM | POA: Insufficient documentation

## 2016-05-19 DIAGNOSIS — Z7982 Long term (current) use of aspirin: Secondary | ICD-10-CM | POA: Insufficient documentation

## 2016-05-19 DIAGNOSIS — M25462 Effusion, left knee: Secondary | ICD-10-CM | POA: Insufficient documentation

## 2016-05-19 DIAGNOSIS — I10 Essential (primary) hypertension: Secondary | ICD-10-CM | POA: Diagnosis not present

## 2016-05-19 DIAGNOSIS — M25562 Pain in left knee: Secondary | ICD-10-CM | POA: Diagnosis present

## 2016-05-19 DIAGNOSIS — E119 Type 2 diabetes mellitus without complications: Secondary | ICD-10-CM | POA: Insufficient documentation

## 2016-05-19 MED ORDER — LIDOCAINE HCL (PF) 1 % IJ SOLN
10.0000 mL | Freq: Once | INTRAMUSCULAR | Status: AC
  Administered 2016-05-19: 10 mL via INTRADERMAL
  Filled 2016-05-19: qty 10

## 2016-05-19 MED ORDER — BETAMETHASONE SOD PHOS & ACET 6 (3-3) MG/ML IJ SUSP
12.0000 mg | Freq: Once | INTRAMUSCULAR | Status: AC
  Administered 2016-05-19: 12 mg via INTRA_ARTICULAR
  Filled 2016-05-19: qty 2

## 2016-05-19 NOTE — ED Triage Notes (Signed)
Pt c/o left knee swelling since Monday morning. Pt states it was swollen Monday but no pain, pt rubbed some cream on it and it got better but Tuesday he was crawling under the house and it aggravated the pain. Pt states his knee has been acting up multiple times, states hes had fluid on his knee. Usually it gets better on his own but this hasn't gotten better, pt is limping.

## 2016-05-19 NOTE — ED Notes (Signed)
Patient transported to X-ray 

## 2016-05-19 NOTE — Discharge Instructions (Signed)
Contact a health care provider if:  Your knee pain, swelling, or stiffness returns or gets worse.  You have redness, swelling, or pain in your puncture area.  You have fluid, blood, or pus coming from your puncture site.  You have warmth in your puncture area.  You have a fever.  Your pain is not controlled with medicine.

## 2016-05-19 NOTE — ED Provider Notes (Signed)
[facility] DEPT Provider Note   CSN: [id number] Arrival date & time: 05/19/16  1216  By signing my name below, I, Avnee Patel, attest that this documentation has been prepared under the direction and in the presence of  Margarita Mail, PA-C. Electronically Signed: Delton [place], ED Scribe. 05/19/16. 1:18 PM.   History   Chief Complaint Chief Complaint  Patient presents with  . Knee Pain   The history is provided by the patient. No language interpreter was used.   HPI Comments:  Gregory Abbott is a 70 y.o. male who presents to the Emergency Department complaining of sudden onset, gradually worsening left knee swelling x 3 days. Pt states he was crawling under his house 2 days ago which worsened the pain. Pt has taken ibuprofen with no relief. He denies a hx of gout any other associated symptoms or modifying factors at this time. Pt is not followed by an orthopedist.   Past Medical History:  Diagnosis Date  . Blood transfusion without reported diagnosis   . BPH (benign prostatic hypertrophy)   . Cataract   . Childhood asthma   . COPD (chronic obstructive pulmonary disease) ([place])   . Eczema   . Ex-smoker    40 PY hx  . GERD (gastroesophageal reflux disease)   . History of chicken pox   . Hypertension   . Lower back pain    h/o herniated and bulging discs lower back  . Osteoarthritis of neck   . Personal history of colonic polyps 05/07/2013   resected not retrieved  . PUD (peptic ulcer disease)   . S/P splenectomy    partial  . Shortness of breath     Patient Active Problem List   Diagnosis Date Noted  . Low serum thyroid stimulating hormone (TSH) 05/10/2016  . Mixed hyperlipidemia 05/01/2016  . GERD (gastroesophageal reflux disease) 01/27/2016  . Advanced care planning/counseling discussion 04/03/2015  . Controlled type 2 diabetes mellitus without complication ([place]) 0000000  . COPD (chronic obstructive pulmonary disease) ([place]) 12/10/2013  . Skin rash 09/09/2013    . Internal and external bleeding hemorrhoids 05/07/2013  . Personal history of colonic polyp removed not retrieved 05/07/2013  . Medicare annual wellness visit, initial 02/01/2013  . Ex-smoker 10/22/2012  . Hypertension   . Lower back pain     Past Surgical History:  Procedure Laterality Date  . AAA screen  04/2013   neg for AAA  . CATARACT EXTRACTION Left 05/2013   [facility]  . CATARACT EXTRACTION W/PHACO Right 10/06/2015   Procedure: CATARACT EXTRACTION PHACO AND INTRAOCULAR LENS PLACEMENT (IOC);  Surgeon: Birder Robson, MD;  Location: [facility] ORS;  Service: Ophthalmology;  Laterality: Right;  [place] AP% CDE fluid pack lot [id number] H exp 04/02/2017  . COLONOSCOPY  2009  . COLONOSCOPY  05/2013   1 polyp, int/ext hemorrhoids [facility])  . KNEE ARTHROSCOPY    . lower extremity arterial doppler  2013   WNL  . SPLENECTOMY, PARTIAL  1998   fall down stairs    Home Medications    Prior to Admission medications   Medication Sig Start Date End Date Taking? Authorizing Provider  albuterol (PROVENTIL HFA;VENTOLIN HFA) 108 (90 Base) MCG/ACT inhaler Inhale 2 puffs into the lungs every 6 (six) hours as needed for wheezing or shortness of breath. 05/10/16   Ria Bush, MD  amLODipine (NORVASC) 5 MG tablet Take 1 tablet (5 mg total) by mouth daily. Pt said he initially refused med and now needed it sent in since he  talked with me. Thanks! 05/17/16   Ria Bush, MD  aspirin EC 81 MG tablet Take 81 mg by mouth every other day.    Historical Provider, MD  atenolol (TENORMIN) 100 MG tablet Take 1 tablet (100 mg total) by mouth daily after breakfast. 05/10/16   Ria Bush, MD  clobetasol cream (TEMOVATE) [id number] % Apply 1 application topically 2 (two) times daily. Apply to AA for hands, no more than 2 wks at a time 02/18/14   Ria Bush, MD  DiphenhydrAMINE HCl (BENADRYL ALLERGY PO) Take 1 tablet by mouth daily as needed.    Historical Provider, MD  hydrochlorothiazide  (HYDRODIURIL) 25 MG tablet Take 1 tablet (25 mg total) by mouth daily. 05/17/16   Ria Bush, MD  loratadine (CLARITIN) 10 MG tablet Take 1 tablet (10 mg total) by mouth daily. 05/10/16   Ria Bush, MD  losartan-hydrochlorothiazide [facility]) 100-12.5 MG tablet TAKE ONE TABLET BY MOUTH DAILY 05/10/16   Ria Bush, MD  omeprazole (PRILOSEC) 40 MG capsule Take 1 capsule (40 mg total) by mouth daily. 05/10/16   Ria Bush, MD    Family History Family History  Problem Relation Age of Onset  . Diabetes Mother   . Hypertension Mother   . Hyperlipidemia Mother   . Heart disease Mother   . Stroke Father   . Colon cancer Neg Hx   . Rectal cancer Neg Hx   . Stomach cancer Neg Hx   . Esophageal cancer Neg Hx     Social History Social History  Substance Use Topics  . Smoking status: Former Smoker    Packs/day: 0.25    Types: Cigarettes    Quit date: 11/06/2013  . Smokeless tobacco: Never Used  . Alcohol use 1.2 oz/week    1 Cans of beer, 1 Shots of liquor per week     Comment: occasional     Allergies   Penicillins and Azithromycin   Review of Systems Review of Systems  Musculoskeletal: Positive for arthralgias and joint swelling.  Neurological: Negative for numbness.     Physical Exam Updated Vital Signs BP 147/64   Pulse 74   Temp 97.8 F (36.6 C) (Oral)   Resp 16   SpO2 97%   Physical Exam  Constitutional: He is oriented to person, place, and time. He appears well-developed and well-nourished. No distress.  HENT:  Head: Normocephalic and atraumatic.  Eyes: Conjunctivae are normal.  Cardiovascular: Normal rate.   Pulmonary/Chest: Effort normal.  Abdominal: He exhibits no distension.  Musculoskeletal: Normal range of motion. He exhibits no tenderness.       Left knee: He exhibits effusion.  Large joint effusion. Pt able to ambulate. Antalgic gait. Non-tender. FROM.    Neurological: He is alert and oriented to person, place, and time.  Skin:  Skin is warm and dry.  Psychiatric: He has a normal mood and affect.  Nursing note and vitals reviewed.    ED Treatments / Results  DIAGNOSTIC STUDIES:  Oxygen Saturation is 97% on RA, normal by my interpretation.    COORDINATION OF CARE:  1:15 PM Discussed treatment plan with pt at bedside and pt agreed to plan.  Labs (all labs ordered are listed, but only abnormal results are displayed) Labs Reviewed - No data to display  EKG  EKG Interpretation None       Radiology No results found.  Procedures .Joint Aspiration/Arthrocentesis Date/Time: 05/19/2016 2:26 PM Performed by: Margarita Mail Authorized by: Margarita Mail   Consent:    Consent  obtained:  Verbal   Consent given by:  Patient   Risks discussed:  Bleeding and infection Universal protocol:    Procedure explained and questions answered to patient or proxy's satisfaction: yes     Imaging studies available: yes   Location:    Location:  Knee   Knee:  L knee Anesthesia (see MAR for exact dosages):    Anesthesia method:  Local infiltration   Local anesthetic:  Lidocaine 1% w/o epi Procedure details:    Preparation: Patient was prepped and draped in usual sterile fashion     Needle gauge:  18 G   Ultrasound guidance: no     Approach:  Superior   Aspirate amount:  40 ml   Aspirate characteristics:  Yellow   Steroid injected: yes     Specimen collected: no   Post-procedure details:    Dressing:  Adhesive bandage   Patient tolerance of procedure:  Tolerated well, no immediate complications  Injection of joint Date/Time: 05/20/2016 4:56 PM Performed by: Margarita Mail Authorized by: Margarita Mail  Consent: Verbal consent obtained. Risks and benefits: risks, benefits and alternatives were discussed Consent given by: patient Patient identity confirmed: verbally with patient Time out: Immediately prior to procedure a "time out" was called to verify the correct patient, procedure, equipment, support  staff and site/side marked as required. Preparation: Patient was prepped and draped in the usual sterile fashion. Local anesthesia used: yes  Anesthesia: Local anesthesia used: yes Local Anesthetic: lidocaine 1% without epinephrine Anesthetic total: 5 mL  Sedation: Patient sedated: no Patient tolerance: Patient tolerated the procedure well with no immediate complications Comments: Patient injected with 71ml lidocaine and 91ml (6mg ) celestone.     (including critical care time)  Medications Ordered in ED Medications - No data to display   Initial Impression / Assessment and Plan / ED Course  I have reviewed the triage vital signs and the nursing notes.  Pertinent labs & imaging results that were available during my care of the patient were reviewed by me and considered in my medical decision making (see chart for details).  Clinical Course    Patient with arthritis and knee effusion, synovial fluid, clear and yellow without any sign of infection. Patient had immediate improvement in his pain and stiffness. No concern for septic joint or gouty arthritis. Patient is to follow-up with orthopedics. Ambulating in the emergency department. Appears safe for discharge at this time Patient discussed attending physician. Who agrees with assessment, work up , treatment, and plan for discharge.    Final Clinical Impressions(s) / ED Diagnoses   Final diagnoses:  Effusion of left knee    New Prescriptions New Prescriptions   No medications on file  I personally performed the services described in this documentation, which was scribed in my presence. The recorded information has been reviewed and is accurate.       Margarita Mail, PA-C 05/20/16 1659    Leo Grosser, MD 05/23/16 [phone]

## 2016-07-01 ENCOUNTER — Ambulatory Visit (INDEPENDENT_AMBULATORY_CARE_PROVIDER_SITE_OTHER): Payer: Medicare Other | Admitting: Family Medicine

## 2016-07-01 ENCOUNTER — Encounter: Payer: Self-pay | Admitting: Family Medicine

## 2016-07-01 VITALS — BP 138/76 | HR 80 | Temp 97.9°F | Wt 189.2 lb

## 2016-07-01 DIAGNOSIS — R7989 Other specified abnormal findings of blood chemistry: Secondary | ICD-10-CM

## 2016-07-01 DIAGNOSIS — E0590 Thyrotoxicosis, unspecified without thyrotoxic crisis or storm: Secondary | ICD-10-CM

## 2016-07-01 DIAGNOSIS — R946 Abnormal results of thyroid function studies: Secondary | ICD-10-CM | POA: Diagnosis not present

## 2016-07-01 DIAGNOSIS — R21 Rash and other nonspecific skin eruption: Secondary | ICD-10-CM

## 2016-07-01 DIAGNOSIS — I10 Essential (primary) hypertension: Secondary | ICD-10-CM | POA: Diagnosis not present

## 2016-07-01 LAB — CBC WITH DIFFERENTIAL/PLATELET
BASOS ABS: 0 10*3/uL (ref 0.0–0.1)
BASOS PCT: 0.5 % (ref 0.0–3.0)
EOS ABS: 0.8 10*3/uL — AB (ref 0.0–0.7)
Eosinophils Relative: 8.4 % — ABNORMAL HIGH (ref 0.0–5.0)
HEMATOCRIT: 50.6 % (ref 39.0–52.0)
HEMOGLOBIN: 17 g/dL (ref 13.0–17.0)
LYMPHS PCT: 33.7 % (ref 12.0–46.0)
Lymphs Abs: 3.1 10*3/uL (ref 0.7–4.0)
MCHC: 33.6 g/dL (ref 30.0–36.0)
MCV: 91.3 fl (ref 78.0–100.0)
MONO ABS: 1 10*3/uL (ref 0.1–1.0)
Monocytes Relative: 10.6 % (ref 3.0–12.0)
Neutro Abs: 4.3 10*3/uL (ref 1.4–7.7)
Neutrophils Relative %: 46.8 % (ref 43.0–77.0)
Platelets: 314 10*3/uL (ref 150.0–400.0)
RBC: 5.54 Mil/uL (ref 4.22–5.81)
RDW: 14 % (ref 11.5–15.5)
WBC: 9.3 10*3/uL (ref 4.0–10.5)

## 2016-07-01 LAB — T4, FREE: FREE T4: 1.11 ng/dL (ref 0.60–1.60)

## 2016-07-01 LAB — TSH: TSH: 0.88 u[IU]/mL (ref 0.35–4.50)

## 2016-07-01 LAB — SEDIMENTATION RATE: SED RATE: 74 mm/h — AB (ref 0–20)

## 2016-07-01 MED ORDER — CLOTRIMAZOLE-BETAMETHASONE 1-0.05 % EX CREA: 1.0000 "application " | TOPICAL_CREAM | Freq: Two times a day (BID) | CUTANEOUS | 0 refills | Status: DC

## 2016-07-01 NOTE — Assessment & Plan Note (Signed)
S/p eval by 2 dermatologists - ?drug rash vs atopic dermatitis. Now new rash in groin - not quite consistent with intertrigo, pityriasis rosea, psoriasis, erythrasma. Will treat for possible intertrigo with lotrisone cream BID x 1 wk and I have asked patient to update [place] with effect. Check labs for systemic causes of skin rash (RPR, ESR, CBC). Recent CMP WNL.  Pt agrees with plan. Avoid oral steroid for now given DM.

## 2016-07-01 NOTE — Assessment & Plan Note (Signed)
bp remains well controlled on current medication - off losartan and initial rash may have improved so will stay on current regimen.

## 2016-07-01 NOTE — Patient Instructions (Addendum)
Use new cream to rash - combination of steroid and antifungal. labwork today to evaluate for other causes of rash.  Update me in 2 weeks with effect.  Keep appointment in February.

## 2016-07-01 NOTE — Progress Notes (Signed)
BP 138/76   Pulse 80   Temp 97.9 F (36.6 C) (Oral)   Wt 189 lb 4 oz (85.8 kg)   BMI 27.55 kg/m    CC: f/u skin rash Subjective:    Patient ID: Gregory Abbott, male    DOB: [birth year], 70 y.o.   MRN: [id number]  HPI: Gregory Abbott is a 70 y.o. male presenting on 07/01/2016 for Follow-up (still itching)   H/o rash to forearms and upper sides - dx as drug rash by Dr Allyson Sabal (2015) then possible atopic dermatitis by Dr Nehemiah Massed (2017), treated with steroid cream with temporary relief. Last visit concern for losartan causing rash so we changed hyzaar to plain hctz + amlodipine. He has been on this regimen (along with atenolol) for the last 6 wks. He does think that rash has improved some off losartan.  Main concern today is rash around groin - thinks jock itch. Affecting around anus, penis, and pubic hair. Lotrimin OTC hasn't helped. Coconut oil does help. Also noticing itchy circular rash on thighs that leave scars. Using anti itch tablets and benadryl cream.   No new lotions, detergents, soaps or shampoos. No new medicines other than amlodipine (see above). No new foods.  No fevers/chills, nausea, oral lesions, new joint pains, abdominal pain.   Relevant past medical, surgical, family and social history reviewed and updated as indicated. Interim medical history since our last visit reviewed. Allergies and medications reviewed and updated. Current Outpatient Prescriptions on File Prior to Visit  Medication Sig  . albuterol (PROVENTIL HFA;VENTOLIN HFA) 108 (90 Base) MCG/ACT inhaler Inhale 2 puffs into the lungs every 6 (six) hours as needed for wheezing or shortness of breath.  [name] amLODipine (NORVASC) 5 MG tablet Take 1 tablet (5 mg total) by mouth daily. Pt said he initially refused med and now needed it sent in since he talked with me. Thanks!  [name] aspirin EC 81 MG tablet Take 81 mg by mouth every other day.  [name] atenolol (TENORMIN) 100 MG tablet Take 1 tablet (100 mg total) by mouth daily  after breakfast.  . DiphenhydrAMINE HCl (BENADRYL ALLERGY PO) Take 1 tablet by mouth daily as needed.  . hydrochlorothiazide (HYDRODIURIL) 25 MG tablet Take 1 tablet (25 mg total) by mouth daily.  [name] loratadine (CLARITIN) 10 MG tablet Take 1 tablet (10 mg total) by mouth daily.  [name] omeprazole (PRILOSEC) 40 MG capsule Take 1 capsule (40 mg total) by mouth daily.  . clobetasol cream (TEMOVATE) 5.72 % Apply 1 application topically 2 (two) times daily. Apply to [place] for hands, no more than 2 wks at a time (Patient not taking: Reported on 07/01/2016)   No current facility-administered medications on file prior to visit.     Review of Systems Per HPI unless specifically indicated in ROS section     Objective:    BP 138/76   Pulse 80   Temp 97.9 F (36.6 C) (Oral)   Wt 189 lb 4 oz (85.8 kg)   BMI 27.55 kg/m   Wt Readings from Last 3 Encounters:  07/01/16 189 lb 4 oz (85.8 kg)  05/10/16 194 lb (88 kg)  05/02/16 194 lb (88 kg)    Physical Exam  Constitutional: He appears well-developed and well-nourished. No distress.  HENT:  Mouth/Throat: Oropharynx is clear and moist. No oropharyngeal exudate.  Skin: Rash noted.  Improving rash on trunk and arms New pruritic hyperpigmented macular rash throughout groin including scrotum and some on penis Small pruritic plaques on  thighs No penile ulcers  Nursing note and vitals reviewed.  Results for orders placed or performed in visit on 05/10/16  TSH  Result Value Ref Range   TSH 0.20 (L) 0.35 - 4.50 uIU/mL  T3  Result Value Ref Range   T3, Total 133.0 76 - 181 ng/dL  T4, free  Result Value Ref Range   Free T4 1.48 0.60 - 1.60 ng/dL      Assessment & Plan:   Problem List Items Addressed This Visit    Hypertension    bp remains well controlled on current medication - off losartan and initial rash may have improved so will stay on current regimen.       Skin rash - Primary    S/p eval by 2 dermatologists - ?drug rash vs atopic  dermatitis. Now new rash in groin - not quite consistent with intertrigo, pityriasis rosea, psoriasis, erythrasma. Will treat for possible intertrigo with lotrisone cream BID x 1 wk and I have asked patient to update [place] with effect. Check labs for systemic causes of skin rash (RPR, ESR, CBC). Recent CMP WNL.  Pt agrees with plan. Avoid oral steroid for now given DM.      Relevant Orders   TSH   Sedimentation rate   CBC with Differential/Platelet   RPR   Subclinical hyperthyroidism    Update TFTs. Discussed possible endo referral. ?rash related to this.          Follow up plan: No Follow-up on file.  Ria Bush, MD

## 2016-07-01 NOTE — Progress Notes (Signed)
Pre visit review using our clinic review tool, if applicable. No additional management support is needed unless otherwise documented below in the visit note. 

## 2016-07-01 NOTE — Assessment & Plan Note (Signed)
Update TFTs. Discussed possible endo referral. ?rash related to this.

## 2016-07-02 LAB — RPR

## 2016-07-02 LAB — T3: T3, Total: 139 ng/dL (ref 76–181)

## 2016-07-08 ENCOUNTER — Telehealth: Payer: Self-pay | Admitting: Family Medicine

## 2016-07-08 NOTE — Telephone Encounter (Signed)
Pt returned your call - please call back at [phone] Thanks

## 2016-07-08 NOTE — Telephone Encounter (Signed)
Message left for patient to return my call.  

## 2016-07-17 ENCOUNTER — Other Ambulatory Visit: Payer: Self-pay | Admitting: Family Medicine

## 2016-08-03 DIAGNOSIS — Z961 Presence of intraocular lens: Secondary | ICD-10-CM | POA: Diagnosis not present

## 2016-08-04 LAB — HM DIABETES EYE EXAM

## 2016-08-09 ENCOUNTER — Ambulatory Visit (INDEPENDENT_AMBULATORY_CARE_PROVIDER_SITE_OTHER): Payer: Medicare Other | Admitting: Family Medicine

## 2016-08-09 ENCOUNTER — Encounter: Payer: Self-pay | Admitting: Family Medicine

## 2016-08-09 VITALS — BP 110/76 | HR 72 | Temp 97.9°F | Wt 193.8 lb

## 2016-08-09 DIAGNOSIS — R21 Rash and other nonspecific skin eruption: Secondary | ICD-10-CM | POA: Diagnosis not present

## 2016-08-09 DIAGNOSIS — I10 Essential (primary) hypertension: Secondary | ICD-10-CM | POA: Diagnosis not present

## 2016-08-09 DIAGNOSIS — E119 Type 2 diabetes mellitus without complications: Secondary | ICD-10-CM | POA: Diagnosis not present

## 2016-08-09 DIAGNOSIS — E0590 Thyrotoxicosis, unspecified without thyrotoxic crisis or storm: Secondary | ICD-10-CM

## 2016-08-09 LAB — MICROALBUMIN / CREATININE URINE RATIO
CREATININE, U: 250.8 mg/dL
Microalb Creat Ratio: 0.4 mg/g (ref 0.0–30.0)
Microalb, Ur: 1 mg/dL (ref 0.0–1.9)

## 2016-08-09 MED ORDER — CLOTRIMAZOLE-BETAMETHASONE 1-0.05 % EX CREA: 1.0000 "application " | TOPICAL_CREAM | Freq: Two times a day (BID) | CUTANEOUS | 0 refills | Status: DC | PRN

## 2016-08-09 NOTE — Assessment & Plan Note (Signed)
Groin rash has responded to lotrisone then lotrimin - anticipate intertrigo. Continue.  Skin rash on body of unclear etiology ?drug rash with elevated ESR (70s) last visit - will return to derm for further eval.

## 2016-08-09 NOTE — Progress Notes (Signed)
BP 110/76   Pulse 72   Temp 97.9 F (36.6 C) (Oral)   Wt 193 lb 12 oz (87.9 kg)   BMI 28.20 kg/m    CC: 33mof/u visit Subjective:    Patient ID: Gregory Abbott male    DOB: 1March 04, 1947 71y.o.   MRN: [id number] HPI: WCATALINO PLASCENCIAis a 71y.o. male presenting on 08/09/2016 for Follow-up   See prior notes for details.  H/o rash to forearms and upper sides - dx as drug rash by Dr LAllyson Sabal(2015) then possible atopic dermatitis by Dr KNehemiah Massed(2017), steroid cream provides temporary relief. Concern for losartan causing rash so we changed hyzaar to plain hctz + amlodipine with some improvement.   Last visit we also treated groin rash with lotrisone x1 wk with good relief but temporary.   Subclinical hyperthyroidism - latest TFTs stable.   DM - regularly does not check sugars. Compliant with antihyperglycemic regimen which includes: diet controlled.  Denies low sugars or hypoglycemic symptoms.  Denies paresthesias. Last diabetic eye exam last week. Foot exam due. Umicroalb due. Pneumovax: 2016.  Prevnar: 2015.  Lab Results  Component Value Date   HGBA1C 6.8 (H) 05/02/2016   Diabetic Foot Exam - Simple   Simple Foot Form Diabetic Foot exam was performed with the following findings:  Yes 08/09/2016  9:25 AM  Visual Inspection No deformities, no ulcerations, no other skin breakdown bilaterally:  Yes Sensation Testing Intact to touch and monofilament testing bilaterally:  Yes Pulse Check Posterior Tibialis and Dorsalis pulse intact bilaterally:  Yes Comments      Relevant past medical, surgical, family and social history reviewed and updated as indicated. Interim medical history since our last visit reviewed. Allergies and medications reviewed and updated. Current Outpatient Prescriptions on File Prior to Visit  Medication Sig  . albuterol (PROVENTIL HFA;VENTOLIN HFA) 108 (90 Base) MCG/ACT inhaler Inhale 2 puffs into the lungs every 6 (six) hours as needed for wheezing or  shortness of breath.  .Marland KitchenamLODipine (NORVASC) 5 MG tablet Take 1 tablet (5 mg total) by mouth daily. Pt said he initially refused med and now needed it sent in since he talked with me. Thanks!  .Marland Kitchenaspirin EC 81 MG tablet Take 81 mg by mouth every other day.  .Marland Kitchenatenolol (TENORMIN) 100 MG tablet Take 1 tablet (100 mg total) by mouth daily after breakfast.  . clobetasol cream (TEMOVATE) 01.00% Apply 1 application topically 2 (two) times daily. Apply to AA for hands, no more than 2 wks at a time  . DiphenhydrAMINE HCl (BENADRYL ALLERGY PO) Take 1 tablet by mouth daily as needed.  . hydrochlorothiazide (HYDRODIURIL) 25 MG tablet Take 1 tablet (25 mg total) by mouth daily.  .Marland Kitchenloratadine (CLARITIN) 10 MG tablet Take 1 tablet (10 mg total) by mouth daily.  .Marland Kitchenomeprazole (PRILOSEC) 40 MG capsule Take 1 capsule (40 mg total) by mouth daily.   No current facility-administered medications on file prior to visit.     Review of Systems Per HPI unless specifically indicated in ROS section     Objective:    BP 110/76   Pulse 72   Temp 97.9 F (36.6 C) (Oral)   Wt 193 lb 12 oz (87.9 kg)   BMI 28.20 kg/m   Wt Readings from Last 3 Encounters:  08/09/16 193 lb 12 oz (87.9 kg)  07/01/16 189 lb 4 oz (85.8 kg)  05/10/16 194 lb (88 kg)    Physical Exam  Constitutional: He appears well-developed and well-nourished. No distress.  HENT:  Head: Normocephalic and atraumatic.  Right Ear: External ear normal.  Left Ear: External ear normal.  Nose: Nose normal.  Mouth/Throat: Oropharynx is clear and moist. No oropharyngeal exudate.  Eyes: Conjunctivae and EOM are normal. Pupils are equal, round, and reactive to light. No scleral icterus.  Neck: Normal range of motion. Neck supple.  Cardiovascular: Normal rate, regular rhythm, normal heart sounds and intact distal pulses.   No murmur heard. Pulmonary/Chest: Effort normal and breath sounds normal. No respiratory distress. He has no wheezes. He has no rales.    Musculoskeletal: He exhibits no edema.  See HPI for foot exam if done  Lymphadenopathy:    He has no cervical adenopathy.  Skin: Skin is warm and dry. Rash noted.  Minimal pruritic erythema at groin  Faint pruritic patches sparingly on trunk, arms, back   Psychiatric: He has a normal mood and affect.  Nursing note and vitals reviewed.  Results for orders placed or performed in visit on 08/09/16  HM DIABETES EYE EXAM  Result Value Ref Range   HM Diabetic Eye Exam No Retinopathy No Retinopathy      Assessment & Plan:   Problem List Items Addressed This Visit    Controlled type 2 diabetes mellitus without complication ([place]) - Primary    Discussed with patient. Foot exam today. UTD eye exam.  Check A1c next visit, 54mo     Hypertension    Chronic, stable. Continue current regimen of atenolol 1059mqd, amlodipine 34m80md, hctz 234m58m.       Skin rash    Groin rash has responded to lotrisone then lotrimin - anticipate intertrigo. Continue.  Skin rash on body of unclear etiology ?drug rash with elevated ESR (70s) last visit - will return to derm for further eval.       Subclinical hyperthyroidism    Latest TFTs stable. Recheck next visit.           Follow up plan: Return in about 4 months (around 12/07/2016) for follow up visit.  JaviRia Bush

## 2016-08-09 NOTE — Assessment & Plan Note (Signed)
Chronic, stable. Continue current regimen of atenolol 100mg  qd, amlodipine 5mg  qd, hctz 25mg  qd.

## 2016-08-09 NOTE — Addendum Note (Signed)
Addended by: Ellamae Sia on: 08/09/2016 11:16 AM   Modules accepted: Orders

## 2016-08-09 NOTE — Assessment & Plan Note (Signed)
Discussed with patient. Foot exam today. UTD eye exam.  Check A1c next visit, 57mo

## 2016-08-09 NOTE — Assessment & Plan Note (Signed)
Latest TFTs stable. Recheck next visit.

## 2016-08-09 NOTE — Progress Notes (Signed)
Pre visit review using our clinic review tool, if applicable. No additional management support is needed unless otherwise documented below in the visit note. 

## 2016-08-09 NOTE — Patient Instructions (Addendum)
Good to see you today. Labs next visit.  Urine check for microalbumin today.  Return in 3-4 months for follow up visit.  Continue lotrimin to groin. May use lotrisone if needed, but use sparingly.

## 2016-08-10 ENCOUNTER — Encounter: Payer: Self-pay | Admitting: *Deleted

## 2017-02-10 ENCOUNTER — Other Ambulatory Visit: Payer: Self-pay | Admitting: Family Medicine

## 2017-04-05 DIAGNOSIS — Z23 Encounter for immunization: Secondary | ICD-10-CM | POA: Diagnosis not present

## 2017-06-05 ENCOUNTER — Telehealth: Payer: Self-pay

## 2017-06-05 NOTE — Telephone Encounter (Signed)
Client Gregory Abbott - Client Client Site [place] Physician Ria Bush - MD Contact Type Call Who Is Calling Patient / Member / Family / Caregiver Caller Name [place] Phone Number [phone] Reason for Call Request to Schedule Office Appointment Initial Comment Caller states he is needing to schedule an appt for wellness exam, recall on BP medication, and would like to switch medications. Call Closed By: Rosana Fret Transaction Date/Time: 06/03/2017 2:33:29 PM (ET)

## 2017-06-05 NOTE — Telephone Encounter (Signed)
Patient has a CPE scheduled for 06/08/17 with Dr. Darnell Level to review medication concerns.

## 2017-06-08 ENCOUNTER — Ambulatory Visit (INDEPENDENT_AMBULATORY_CARE_PROVIDER_SITE_OTHER): Payer: Medicare Other | Admitting: Family Medicine

## 2017-06-08 ENCOUNTER — Encounter: Payer: Self-pay | Admitting: Family Medicine

## 2017-06-08 VITALS — BP 134/82 | HR 73 | Temp 98.2°F | Ht 69.0 in | Wt 187.8 lb

## 2017-06-08 DIAGNOSIS — Z87891 Personal history of nicotine dependence: Secondary | ICD-10-CM | POA: Diagnosis not present

## 2017-06-08 DIAGNOSIS — Z0001 Encounter for general adult medical examination with abnormal findings: Secondary | ICD-10-CM

## 2017-06-08 DIAGNOSIS — I10 Essential (primary) hypertension: Secondary | ICD-10-CM | POA: Diagnosis not present

## 2017-06-08 DIAGNOSIS — K219 Gastro-esophageal reflux disease without esophagitis: Secondary | ICD-10-CM

## 2017-06-08 DIAGNOSIS — R202 Paresthesia of skin: Secondary | ICD-10-CM | POA: Diagnosis not present

## 2017-06-08 DIAGNOSIS — Z7189 Other specified counseling: Secondary | ICD-10-CM | POA: Diagnosis not present

## 2017-06-08 DIAGNOSIS — E782 Mixed hyperlipidemia: Secondary | ICD-10-CM | POA: Diagnosis not present

## 2017-06-08 DIAGNOSIS — E0590 Thyrotoxicosis, unspecified without thyrotoxic crisis or storm: Secondary | ICD-10-CM | POA: Diagnosis not present

## 2017-06-08 DIAGNOSIS — Z0000 Encounter for general adult medical examination without abnormal findings: Secondary | ICD-10-CM

## 2017-06-08 DIAGNOSIS — N529 Male erectile dysfunction, unspecified: Secondary | ICD-10-CM

## 2017-06-08 DIAGNOSIS — R21 Rash and other nonspecific skin eruption: Secondary | ICD-10-CM | POA: Diagnosis not present

## 2017-06-08 DIAGNOSIS — J449 Chronic obstructive pulmonary disease, unspecified: Secondary | ICD-10-CM

## 2017-06-08 DIAGNOSIS — E119 Type 2 diabetes mellitus without complications: Secondary | ICD-10-CM

## 2017-06-08 DIAGNOSIS — Z125 Encounter for screening for malignant neoplasm of prostate: Secondary | ICD-10-CM | POA: Diagnosis not present

## 2017-06-08 MED ORDER — OMEPRAZOLE 40 MG PO CPDR: 40.0000 mg | DELAYED_RELEASE_CAPSULE | Freq: Every day | ORAL | 3 refills | Status: DC

## 2017-06-08 MED ORDER — ATENOLOL 100 MG PO TABS: 100.0000 mg | ORAL_TABLET | Freq: Every day | ORAL | 3 refills | Status: DC

## 2017-06-08 MED ORDER — SILDENAFIL CITRATE 100 MG PO TABS: 50.0000 mg | ORAL_TABLET | Freq: Every day | ORAL | 11 refills | Status: DC | PRN

## 2017-06-08 NOTE — Patient Instructions (Addendum)
If interested, check with pharmacy about new 2 shot shingles series (shingrix).  Continue current medicines. I've sent viagra to pharmacy for a trial. Good to see you today. Return at your convenience for fasting labs.  Return as needed or in 1 year for next medicare wellness visit.   Health Maintenance, Male A healthy lifestyle and preventive care is important for your health and wellness. Ask your health care provider about what schedule of regular examinations is right for you. What should I know about weight and diet? Eat a Healthy Diet  Eat plenty of vegetables, fruits, whole grains, low-fat dairy products, and lean protein.  Do not eat a lot of foods high in solid fats, added sugars, or salt.  Maintain a Healthy Weight Regular exercise can help you achieve or maintain a healthy weight. You should:  Do at least 150 minutes of exercise each week. The exercise should increase your heart rate and make you sweat (moderate-intensity exercise).  Do strength-training exercises at least twice a week.  Watch Your Levels of Cholesterol and Blood Lipids  Have your blood tested for lipids and cholesterol every 5 years starting at 72 years of age. If you are at high risk for heart disease, you should start having your blood tested when you are 71 years old. You may need to have your cholesterol levels checked more often if: ? Your lipid or cholesterol levels are high. ? You are older than 72 years of age. ? You are at high risk for heart disease.  What should I know about cancer screening? Many types of cancers can be detected early and may often be prevented. Lung Cancer  You should be screened every year for lung cancer if: ? You are a current smoker who has smoked for at least 30 years. ? You are a former smoker who has quit within the past 15 years.  Talk to your health care provider about your screening options, when you should start screening, and how often you should be  screened.  Colorectal Cancer  Routine colorectal cancer screening usually begins at 72 years of age and should be repeated every 5-10 years until you are 72 years old. You may need to be screened more often if early forms of precancerous polyps or small growths are found. Your health care provider may recommend screening at an earlier age if you have risk factors for colon cancer.  Your health care provider may recommend using home test kits to check for hidden blood in the stool.  A small camera at the end of a tube can be used to examine your colon (sigmoidoscopy or colonoscopy). This checks for the earliest forms of colorectal cancer.  Prostate and Testicular Cancer  Depending on your age and overall health, your health care provider may do certain tests to screen for prostate and testicular cancer.  Talk to your health care provider about any symptoms or concerns you have about testicular or prostate cancer.  Skin Cancer  Check your skin from head to toe regularly.  Tell your health care provider about any new moles or changes in moles, especially if: ? There is a change in a mole's size, shape, or color. ? You have a mole that is larger than a pencil eraser.  Always use sunscreen. Apply sunscreen liberally and repeat throughout the day.  Protect yourself by wearing long sleeves, pants, a wide-brimmed hat, and sunglasses when outside.  What should I know about heart disease, diabetes, and high blood pressure?  If you are 74-62 years of age, have your blood pressure checked every 3-5 years. If you are 65 years of age or older, have your blood pressure checked every year. You should have your blood pressure measured twice-once when you are at a hospital or clinic, and once when you are not at a hospital or clinic. Record the average of the two measurements. To check your blood pressure when you are not at a hospital or clinic, you can use: ? An automated blood pressure machine at a  pharmacy. ? A home blood pressure monitor.  Talk to your health care provider about your target blood pressure.  If you are between 75-61 years old, ask your health care provider if you should take aspirin to prevent heart disease.  Have regular diabetes screenings by checking your fasting blood sugar level. ? If you are at a normal weight and have a low risk for diabetes, have this test once every three years after the age of 69. ? If you are overweight and have a high risk for diabetes, consider being tested at a younger age or more often.  A one-time screening for abdominal aortic aneurysm (AAA) by ultrasound is recommended for men aged 61-75 years who are current or former smokers. What should I know about preventing infection? Hepatitis B If you have a higher risk for hepatitis B, you should be screened for this virus. Talk with your health care provider to find out if you are at risk for hepatitis B infection. Hepatitis C Blood testing is recommended for:  Everyone born from [birth year] through 1965.  Anyone with known risk factors for hepatitis C.  Sexually Transmitted Diseases (STDs)  You should be screened each year for STDs including gonorrhea and chlamydia if: ? You are sexually active and are younger than 72 years of age. ? You are older than 72 years of age and your health care provider tells you that you are at risk for this type of infection. ? Your sexual activity has changed since you were last screened and you are at an increased risk for chlamydia or gonorrhea. Ask your health care provider if you are at risk.  Talk with your health care provider about whether you are at high risk of being infected with HIV. Your health care provider may recommend a prescription medicine to help prevent HIV infection.  What else can I do?  Schedule regular health, dental, and eye exams.  Stay current with your vaccines (immunizations).  Do not use any tobacco products, such as  cigarettes, chewing tobacco, and e-cigarettes. If you need help quitting, ask your health care provider.  Limit alcohol intake to no more than 2 drinks per day. One drink equals 12 ounces of beer, 5 ounces of wine, or 1 ounces of hard liquor.  Do not use street drugs.  Do not share needles.  Ask your health care provider for help if you need support or information about quitting drugs.  Tell your health care provider if you often feel depressed.  Tell your health care provider if you have ever been abused or do not feel safe at home. This information is not intended to replace advice given to you by your health care provider. Make sure you discuss any questions you have with your health care provider. Document Released: 12/17/2007 Document Revised: 02/17/2016 Document Reviewed: 03/24/2015 Elsevier Interactive Patient Education  2018 [place] on advanced directive at home and bring [place] a copy for your chart.  Blood pressures are doing well - continue current medicines.  Trial viagra 1/2-1 tablet as needed for relations.  You are doing well today Return as needed or in 1 year for next physical Return at your convenience for fasting labs.   Health Maintenance, Male A healthy lifestyle and preventive care is important for your health and wellness. Ask your health care provider about what schedule of regular examinations is right for you. What should I know about weight and diet? Eat a Healthy Diet  Eat plenty of vegetables, fruits, whole grains, low-fat dairy products, and lean protein.  Do not eat a lot of foods high in solid fats, added sugars, or salt.  Maintain a Healthy Weight Regular exercise can help you achieve or maintain a healthy weight. You should:  Do at least 150 minutes of exercise each week. The exercise should increase your heart rate and make you sweat (moderate-intensity exercise).  Do strength-training exercises at least twice a week.  Watch Your  Levels of Cholesterol and Blood Lipids  Have your blood tested for lipids and cholesterol every 5 years starting at 72 years of age. If you are at high risk for heart disease, you should start having your blood tested when you are 72 years old. You may need to have your cholesterol levels checked more often if: ? Your lipid or cholesterol levels are high. ? You are older than 72 years of age. ? You are at high risk for heart disease.  What should I know about cancer screening? Many types of cancers can be detected early and may often be prevented. Lung Cancer  You should be screened every year for lung cancer if: ? You are a current smoker who has smoked for at least 30 years. ? You are a former smoker who has quit within the past 15 years.  Talk to your health care provider about your screening options, when you should start screening, and how often you should be screened.  Colorectal Cancer  Routine colorectal cancer screening usually begins at 72 years of age and should be repeated every 5-10 years until you are 72 years old. You may need to be screened more often if early forms of precancerous polyps or small growths are found. Your health care provider may recommend screening at an earlier age if you have risk factors for colon cancer.  Your health care provider may recommend using home test kits to check for hidden blood in the stool.  A small camera at the end of a tube can be used to examine your colon (sigmoidoscopy or colonoscopy). This checks for the earliest forms of colorectal cancer.  Prostate and Testicular Cancer  Depending on your age and overall health, your health care provider may do certain tests to screen for prostate and testicular cancer.  Talk to your health care provider about any symptoms or concerns you have about testicular or prostate cancer.  Skin Cancer  Check your skin from head to toe regularly.  Tell your health care provider about any new moles  or changes in moles, especially if: ? There is a change in a mole's size, shape, or color. ? You have a mole that is larger than a pencil eraser.  Always use sunscreen. Apply sunscreen liberally and repeat throughout the day.  Protect yourself by wearing long sleeves, pants, a wide-brimmed hat, and sunglasses when outside.  What should I know about heart disease, diabetes, and high blood pressure?  If you are 18-39 years of  age, have your blood pressure checked every 3-5 years. If you are 65 years of age or older, have your blood pressure checked every year. You should have your blood pressure measured twice-once when you are at a hospital or clinic, and once when you are not at a hospital or clinic. Record the average of the two measurements. To check your blood pressure when you are not at a hospital or clinic, you can use: ? An automated blood pressure machine at a pharmacy. ? A home blood pressure monitor.  Talk to your health care provider about your target blood pressure.  If you are between 73-46 years old, ask your health care provider if you should take aspirin to prevent heart disease.  Have regular diabetes screenings by checking your fasting blood sugar level. ? If you are at a normal weight and have a low risk for diabetes, have this test once every three years after the age of 68. ? If you are overweight and have a high risk for diabetes, consider being tested at a younger age or more often.  A one-time screening for abdominal aortic aneurysm (AAA) by ultrasound is recommended for men aged 71-75 years who are current or former smokers. What should I know about preventing infection? Hepatitis B If you have a higher risk for hepatitis B, you should be screened for this virus. Talk with your health care provider to find out if you are at risk for hepatitis B infection. Hepatitis C Blood testing is recommended for:  Everyone born from [birth year] through 1965.  Anyone with known  risk factors for hepatitis C.  Sexually Transmitted Diseases (STDs)  You should be screened each year for STDs including gonorrhea and chlamydia if: ? You are sexually active and are younger than 72 years of age. ? You are older than 72 years of age and your health care provider tells you that you are at risk for this type of infection. ? Your sexual activity has changed since you were last screened and you are at an increased risk for chlamydia or gonorrhea. Ask your health care provider if you are at risk.  Talk with your health care provider about whether you are at high risk of being infected with HIV. Your health care provider may recommend a prescription medicine to help prevent HIV infection.  What else can I do?  Schedule regular health, dental, and eye exams.  Stay current with your vaccines (immunizations).  Do not use any tobacco products, such as cigarettes, chewing tobacco, and e-cigarettes. If you need help quitting, ask your health care provider.  Limit alcohol intake to no more than 2 drinks per day. One drink equals 12 ounces of beer, 5 ounces of wine, or 1 ounces of hard liquor.  Do not use street drugs.  Do not share needles.  Ask your health care provider for help if you need support or information about quitting drugs.  Tell your health care provider if you often feel depressed.  Tell your health care provider if you have ever been abused or do not feel safe at home. This information is not intended to replace advice given to you by your health care provider. Make sure you discuss any questions you have with your health care provider. Document Released: 12/17/2007 Document Revised: 02/17/2016 Document Reviewed: 03/24/2015 Elsevier Interactive Patient Education  [employer].

## 2017-06-08 NOTE — Assessment & Plan Note (Addendum)
Advanced directives - Daughter to be HCPOA - new advanced directive packet previously provided. asked to bring [place] copy for chart.

## 2017-06-08 NOTE — Assessment & Plan Note (Signed)
Diet controlled. Update labs when returns.

## 2017-06-08 NOTE — Assessment & Plan Note (Signed)
Significant improvement off losartan.

## 2017-06-08 NOTE — Assessment & Plan Note (Signed)
Chronic, stable. Continue current regimen. 

## 2017-06-08 NOTE — Assessment & Plan Note (Signed)

## 2017-06-08 NOTE — Assessment & Plan Note (Signed)
Anticipate antihypertensive related given tempora relation.  Will trial viagra - discussed side effects and adverse events to monitor for including headache, flushing, chest pain, priapism. Pt aware cannot take nitrates with this.

## 2017-06-08 NOTE — Assessment & Plan Note (Signed)
Stable period off controller medication.

## 2017-06-08 NOTE — Assessment & Plan Note (Signed)
Chronic, stable on PPI.  

## 2017-06-08 NOTE — Progress Notes (Signed)
BP 134/82 (BP Location: Left Arm, Patient Position: Sitting, Cuff Size: Normal)   Pulse 73   Temp 98.2 F (36.8 C) (Oral)   Ht 5\' 9"  (1.753 m)   Wt 187 lb 12 oz (85.2 kg)   SpO2 96%   BMI 27.73 kg/m    CC: medicare wellness visit Subjective:    Patient ID: Gregory Abbott, male    DOB: 07/22/1945, 71 y.o.   MRN: [id number]  HPI: Gregory Abbott is a 71 y.o. male presenting on 06/08/2017 for Medicare Wellness (Has questions about recall for amlodipine and HCTZ); Headache (mild off and on for last 2 wks. Thinks it maybe due to a couple of bad teeth. Has taken ibuprofen, helpful); and Tingling (in toes and hands off and on about 1 yr)   Did not see Katha Cabal this year.  Antihypertensive med changes over the past year due to concerns over drug rash. He has done better since stopping losartan. Reviewed recent recall notices.  Voiding well, stooling well.  Noticing some paresthesias "sharp tingling" of hands and toes. Occasional toe numbness. No burning pain. Also has noticed mild headache over last 2 weeks. Attributes to bad tooth he has (?L upper canine).    Hearing Screening   125Hz  250Hz  500Hz  1000Hz  2000Hz  3000Hz  4000Hz  6000Hz  8000Hz   Right ear:   40 0 20  20    Left ear:   40 20 0  0      Visual Acuity Screening   Right eye Left eye Both eyes  Without correction: 20/40 20/30 20/25   With correction:     Passes depression and fall screens  Preventative: COLONOSCOPY Date: 05/2013 1 polyp, int/ext hemorrhoids. Small transverse polyp removed but not recovered - consider rpt 2019 Carlean Purl) Prostate cancer screening - discussed, normal DRE 2017, desires skipped this year Lung cancer screening - ~25 PY hx - not eligible. 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 asked to bring [place] copy for chart. Seat  belt use discussed  Sunscreen use discussed. No changing moles on skin  Ex smoker - quit 2014  Alcohol - rare (backed off due to itchy rash)  Due for f/u with eye doctor.  Due for f/u with dentist.   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 Interim medical history since our last visit reviewed. Allergies and medications reviewed and updated. Outpatient Medications Prior to Visit  Medication Sig Dispense Refill  . albuterol (PROVENTIL HFA;VENTOLIN HFA) 108 (90 Base) MCG/ACT inhaler Inhale 2 puffs into the lungs every 6 (six) hours as needed for wheezing or shortness of breath. 1 Inhaler 3  . amLODipine (NORVASC) 5 MG tablet TAKE 1 TABLET BY MOUTH ONCE DAILY. 90 tablet 1  . aspirin EC 81 MG tablet Take 81 mg by mouth every other day.    . clobetasol cream (TEMOVATE) 7.78 % Apply 1 application topically 2 (two) times daily. Apply to AA for hands, no more than 2 wks at a time 60 g 0  . clotrimazole-betamethasone (LOTRISONE) cream Apply 1 application topically 2 (two) times daily as needed. 45 g 0  . DiphenhydrAMINE HCl (BENADRYL ALLERGY PO) Take 1 tablet by mouth daily as  needed.    . hydrochlorothiazide (HYDRODIURIL) 25 MG tablet TAKE 1 TABLET (25 MG TOTAL) BY MOUTH DAILY. 90 tablet 1  . loratadine (CLARITIN) 10 MG tablet Take 1 tablet (10 mg total) by mouth daily.    [name] atenolol (TENORMIN) 100 MG tablet Take 1 tablet (100 mg total) by mouth daily after breakfast. 90 tablet 3  . omeprazole (PRILOSEC) 40 MG capsule Take 1 capsule (40 mg total) by mouth daily. 90 capsule 3   No facility-administered medications prior to visit.      Per HPI unless specifically indicated in ROS section below Review of Systems     Objective:    BP 134/82 (BP Location: Left Arm, Patient Position: Sitting,  Cuff Size: Normal)   Pulse 73   Temp 98.2 F (36.8 C) (Oral)   Ht 5\' 9"  (1.753 m)   Wt 187 lb 12 oz (85.2 kg)   SpO2 96%   BMI 27.73 kg/m   Wt Readings from Last 3 Encounters:  06/08/17 187 lb 12 oz (85.2 kg)  08/09/16 193 lb 12 oz (87.9 kg)  07/01/16 189 lb 4 oz (85.8 kg)    Physical Exam  Constitutional: He is oriented to person, place, and time. He appears well-developed and well-nourished. No distress.  HENT:  Head: Normocephalic and atraumatic.  Right Ear: Hearing, tympanic membrane, external ear and ear canal normal.  Left Ear: Hearing, tympanic membrane, external ear and ear canal normal.  Nose: Nose normal.  Mouth/Throat: Uvula is midline, oropharynx is clear and moist and mucous membranes are normal. No oropharyngeal exudate, posterior oropharyngeal edema or posterior oropharyngeal erythema.  Eyes: Conjunctivae and EOM are normal. Pupils are equal, round, and reactive to light. No scleral icterus.  Neck: Normal range of motion. Neck supple. Carotid bruit is not present. No thyromegaly present.  Cardiovascular: Normal rate, regular rhythm, normal heart sounds and intact distal pulses.  No murmur heard. Pulses:      Radial pulses are 2+ on the right side, and 2+ on the left side.  Pulmonary/Chest: Effort normal and breath sounds normal. No respiratory distress. He has no wheezes. He has no rales.  Abdominal: Soft. Bowel sounds are normal. He exhibits no distension and no mass. There is no tenderness. There is no rebound and no guarding.  Musculoskeletal: Normal range of motion. He exhibits no edema.  Lymphadenopathy:    He has no cervical adenopathy.  Neurological: He is alert and oriented to person, place, and time.  CN grossly intact, station and gait intact  Skin: Skin is warm and dry. No rash noted.  Psychiatric: He has a normal mood and affect. His behavior is normal. Judgment and thought content normal.  Nursing note and vitals reviewed.  Results for orders placed  or performed in visit on 08/09/16  Microalbumin / creatinine urine ratio  Result Value Ref Range   Microalb, Ur 1.0 0.0 - 1.9 mg/dL   Creatinine,U 250.8 mg/dL   Microalb Creat Ratio 0.4 0.0 - 30.0 mg/g  HM DIABETES EYE EXAM  Result Value Ref Range   HM Diabetic Eye Exam No Retinopathy No Retinopathy      Assessment & Plan:   Problem List Items Addressed This Visit    Advanced care planning/counseling discussion    Advanced directives - Daughter to be HCPOA -  new advanced directive packet previously provided. asked to bring [place] copy for chart.      Controlled type 2 diabetes mellitus without complication (HCC)    Diet controlled. Update  labs when returns.       Relevant Orders   Hemoglobin A1c   Microalbumin / creatinine urine ratio   COPD (chronic obstructive pulmonary disease) (HCC)    Stable period off controller medication.       Erectile dysfunction of organic origin    Anticipate antihypertensive related given tempora relation.  Will trial viagra - discussed side effects and adverse events to monitor for including headache, flushing, chest pain, priapism. Pt aware cannot take nitrates with this.       Ex-smoker    Quit ~2015. Today endorses 25 PY hx. Not lung cancer screening candidate.       GERD (gastroesophageal reflux disease)    Chronic, stable on PPI       Relevant Medications   omeprazole (PRILOSEC) 40 MG capsule   Hypertension    Chronic, stable. Continue current regimen.       Relevant Medications   atenolol (TENORMIN) 100 MG tablet   sildenafil (VIAGRA) 100 MG tablet   Other Relevant Orders   Basic metabolic panel   Medicare annual wellness visit, subsequent - Primary    I have personally reviewed the Medicare Annual Wellness questionnaire and have noted 1. The patient's medical and social history 2. Their use of alcohol, tobacco or illicit drugs 3. Their current medications and supplements 4. The patient's functional ability including ADL's,  fall risks, home safety risks and hearing or visual impairment. Cognitive function has been assessed and addressed as indicated.  5. Diet and physical activity 6. Evidence for depression or mood disorders The patients weight, height, BMI have been recorded in the chart. I have made referrals, counseling and provided education to the patient based on review of the above and I have provided the pt with a written personalized care plan for preventive services. Provider list updated.. See scanned questionairre as needed for further documentation. Reviewed preventative protocols and updated unless pt declined.       Mixed hyperlipidemia    Chronic, off statin. Consider starting statin The 10-year ASCVD risk score Mikey Bussing DC Brooke Bonito., et al., 2013) is: 39.7%   Values used to calculate the score:     Age: 12 years     Sex: Male     Is Non-Hispanic African American: Yes     Diabetic: Yes     Tobacco smoker: No     Systolic Blood Pressure: 295 mmHg     Is BP treated: Yes     HDL Cholesterol: 39.1 mg/dL     Total Cholesterol: 223 mg/dL       Relevant Medications   atenolol (TENORMIN) 100 MG tablet   sildenafil (VIAGRA) 100 MG tablet   Other Relevant Orders   Lipid panel   Skin rash    Significant improvement off losartan.       Subclinical hyperthyroidism    Update labs.       Relevant Medications   atenolol (TENORMIN) 100 MG tablet   Other Relevant Orders   TSH    Other Visit Diagnoses    Special screening for malignant neoplasm of prostate       Relevant Orders   PSA, Medicare   Paresthesia       Relevant Orders   Vitamin B12       Follow up plan: Return in about 1 year (around 06/08/2018) for medicare wellness visit.  Ria Bush, MD

## 2017-06-08 NOTE — Assessment & Plan Note (Signed)
Update labs.  

## 2017-06-08 NOTE — Assessment & Plan Note (Signed)
Chronic, off statin. Consider starting statin The 10-year ASCVD risk score Mikey Bussing DC Brooke Bonito., et al., 2013) is: 39.7%   Values used to calculate the score:     Age: 71 years     Sex: Male     Is Non-Hispanic African American: Yes     Diabetic: Yes     Tobacco smoker: No     Systolic Blood Pressure: 703 mmHg     Is BP treated: Yes     HDL Cholesterol: 39.1 mg/dL     Total Cholesterol: 223 mg/dL

## 2017-06-08 NOTE — Assessment & Plan Note (Signed)
Quit ~2015. Today endorses 25 PY hx. Not lung cancer screening candidate.

## 2017-06-15 ENCOUNTER — Other Ambulatory Visit (INDEPENDENT_AMBULATORY_CARE_PROVIDER_SITE_OTHER): Payer: Medicare Other

## 2017-06-15 DIAGNOSIS — I10 Essential (primary) hypertension: Secondary | ICD-10-CM | POA: Diagnosis not present

## 2017-06-15 DIAGNOSIS — Z125 Encounter for screening for malignant neoplasm of prostate: Secondary | ICD-10-CM

## 2017-06-15 DIAGNOSIS — E0590 Thyrotoxicosis, unspecified without thyrotoxic crisis or storm: Secondary | ICD-10-CM

## 2017-06-15 DIAGNOSIS — R202 Paresthesia of skin: Secondary | ICD-10-CM | POA: Diagnosis not present

## 2017-06-15 DIAGNOSIS — E782 Mixed hyperlipidemia: Secondary | ICD-10-CM

## 2017-06-15 DIAGNOSIS — E119 Type 2 diabetes mellitus without complications: Secondary | ICD-10-CM | POA: Diagnosis not present

## 2017-06-15 LAB — BASIC METABOLIC PANEL
BUN: 19 mg/dL (ref 6–23)
CO2: 32 mEq/L (ref 19–32)
CREATININE: 1.02 mg/dL (ref 0.40–1.50)
Calcium: 9.5 mg/dL (ref 8.4–10.5)
Chloride: 99 mEq/L (ref 96–112)
GFR: 92.33 mL/min (ref >60.00)
GLUCOSE: 128 mg/dL — AB (ref 70–99)
Potassium: 3.6 mEq/L (ref 3.5–5.1)
Sodium: 140 mEq/L (ref 135–145)

## 2017-06-15 LAB — VITAMIN B12: Vitamin B-12: 501 pg/mL (ref 211–911)

## 2017-06-15 LAB — LIPID PANEL
CHOL/HDL RATIO: 4
Cholesterol: 212 mg/dL — ABNORMAL HIGH (ref 0–200)
HDL: 48 mg/dL (ref >39.00)
LDL CALC: 139 mg/dL — AB (ref 0–99)
NonHDL: 164.39
Triglycerides: 128 mg/dL (ref 0.0–149.0)
VLDL: 25.6 mg/dL (ref 0.0–40.0)

## 2017-06-15 LAB — MICROALBUMIN / CREATININE URINE RATIO
Creatinine,U: 233.5 mg/dL
Microalb Creat Ratio: 0.4 mg/g (ref 0.0–30.0)
Microalb, Ur: 1 mg/dL (ref 0.0–1.9)

## 2017-06-15 LAB — TSH: TSH: 1.21 u[IU]/mL (ref 0.35–4.50)

## 2017-06-15 LAB — HEMOGLOBIN A1C: Hgb A1c MFr Bld: 6.9 % — ABNORMAL HIGH (ref 4.6–6.5)

## 2017-06-15 LAB — PSA, MEDICARE: PSA: 3.23 ng/ml (ref 0.10–4.00)

## 2017-08-07 ENCOUNTER — Other Ambulatory Visit: Payer: Self-pay | Admitting: Family Medicine

## 2017-08-07 NOTE — Telephone Encounter (Signed)
Last OV:  Annual Exam 06/08/17 Last Refills:   HCTZ #90, 1 refill on 02/10/17                       Amlodipine #90, 1 refill on 02/10/17.  Patient is up to date with appointments and care.  Will refill X 11months.

## 2018-01-28 ENCOUNTER — Other Ambulatory Visit: Payer: Self-pay | Admitting: Family Medicine

## 2018-02-14 ENCOUNTER — Other Ambulatory Visit: Payer: Self-pay | Admitting: Family Medicine

## 2018-04-20 ENCOUNTER — Encounter: Payer: Self-pay | Admitting: Family Medicine

## 2018-04-20 ENCOUNTER — Ambulatory Visit (INDEPENDENT_AMBULATORY_CARE_PROVIDER_SITE_OTHER): Payer: Medicare Other | Admitting: Family Medicine

## 2018-04-20 VITALS — BP 156/86 | HR 73 | Temp 97.6°F | Ht 69.0 in | Wt 174.5 lb

## 2018-04-20 DIAGNOSIS — J302 Other seasonal allergic rhinitis: Secondary | ICD-10-CM

## 2018-04-20 DIAGNOSIS — Z23 Encounter for immunization: Secondary | ICD-10-CM

## 2018-04-20 DIAGNOSIS — I10 Essential (primary) hypertension: Secondary | ICD-10-CM

## 2018-04-20 DIAGNOSIS — J449 Chronic obstructive pulmonary disease, unspecified: Secondary | ICD-10-CM | POA: Diagnosis not present

## 2018-04-20 MED ORDER — DOXYCYCLINE HYCLATE 100 MG PO TABS: 100.0000 mg | ORAL_TABLET | Freq: Two times a day (BID) | ORAL | 0 refills | Status: DC

## 2018-04-20 MED ORDER — FLUTICASONE PROPIONATE 50 MCG/ACT NA SUSP: 2.0000 | Freq: Every day | NASAL | 1 refills | Status: DC

## 2018-04-20 NOTE — Patient Instructions (Addendum)
Monitor blood pressures at home more closely next few weeks, let me know if consistently >140/90.  I think this is allergy flare. Continue zyrtec, continue albuterol inhaler as needed, start flonase nasal steroid for congestion. Monitor cough and symptoms, if not improving with above, fill antibiotic provided today.

## 2018-04-20 NOTE — Addendum Note (Signed)
Addended by: Brenton Grills on: 85/20/7409 11:49 AM   Modules accepted: Orders

## 2018-04-20 NOTE — Progress Notes (Addendum)
BP (!) 156/86 (BP Location: Right Arm, Patient Position: Sitting, Cuff Size: Normal)   Pulse 73   Temp 97.6 F (36.4 C) (Oral)   Ht 5\' 9"  (1.753 m)   Wt 174 lb 8 oz (79.2 kg)   SpO2 96%   BMI 25.77 kg/m   On repeat 168/86  CC: nasal congestion Subjective:    Patient ID: Gregory Abbott, male    DOB: 02-14-1946, 72 y.o.   MRN: [id number]  HPI: Gregory Abbott is a 72 y.o. male presenting on 04/20/2018 for Nasal Congestion (C/o nasal congestion and postnasal drip. Tried Zyrtec. )   Some family stressors recently - lost mother last month, daughter stress.   1+ wk h/o nasal congestion, rhinorrhea, post nasal drainage, dripping. Some cough and dyspnea. Cough largely dry. May be improving some.   Tried zyrtec and "clear lungs" mucinex capsule OTC. Also tried albuterol inhaler.   No fevers/chills, headaches, ear or tooth pain.  Ex smoker - quit 4 yrs ago.   Did not take BP med this morning. Doesn't regularly check bp at home.   Some L shoulder aching. No inciting trauma/injury.   Relevant past medical, surgical, family and social history reviewed and updated as indicated. Interim medical history since our last visit reviewed. Allergies and medications reviewed and updated. Outpatient Medications Prior to Visit  Medication Sig Dispense Refill  . albuterol (PROVENTIL HFA;VENTOLIN HFA) 108 (90 Base) MCG/ACT inhaler Inhale 2 puffs into the lungs every 6 (six) hours as needed for wheezing or shortness of breath. 1 Inhaler 3  . amLODipine (NORVASC) 5 MG tablet TAKE 1 TABLET BY MOUTH EVERY DAY 90 tablet 1  . aspirin EC 81 MG tablet Take 81 mg by mouth every other day.    [name] atenolol (TENORMIN) 100 MG tablet TAKE 1 TABLET (100 MG TOTAL) BY MOUTH DAILY AFTER BREAKFAST. 30 tablet 5  . clobetasol cream (TEMOVATE) 4.09 % Apply 1 application topically 2 (two) times daily. Apply to AA for hands, no more than 2 wks at a time 60 g 0  . clotrimazole-betamethasone (LOTRISONE) cream Apply 1  application topically 2 (two) times daily as needed. 45 g 0  . DiphenhydrAMINE HCl (BENADRYL ALLERGY PO) Take 1 tablet by mouth daily as needed.    . hydrochlorothiazide (HYDRODIURIL) 25 MG tablet TAKE 1 TABLET BY MOUTH EVERY DAY 90 tablet 1  . loratadine (CLARITIN) 10 MG tablet Take 1 tablet (10 mg total) by mouth daily.    [name] omeprazole (PRILOSEC) 40 MG capsule Take 1 capsule (40 mg total) by mouth daily. 90 capsule 3  . sildenafil (VIAGRA) 100 MG tablet Take 0.5-1 tablets (50-100 mg total) by mouth daily as needed for erectile dysfunction. 5 tablet 11   No facility-administered medications prior to visit.      Per HPI unless specifically indicated in ROS section below Review of Systems     Objective:    BP (!) 156/86 (BP Location: Right Arm, Patient Position: Sitting, Cuff Size: Normal)   Pulse 73   Temp 97.6 F (36.4 C) (Oral)   Ht 5\' 9"  (1.753 m)   Wt 174 lb 8 oz (79.2 kg)   SpO2 96%   BMI 25.77 kg/m   Wt Readings from Last 3 Encounters:  04/20/18 174 lb 8 oz (79.2 kg)  06/08/17 187 lb 12 oz (85.2 kg)  08/09/16 193 lb 12 oz (87.9 kg)    Physical Exam  Constitutional: He appears well-developed and well-nourished. No distress.  HENT:  Head: Normocephalic and atraumatic.  Right Ear: Hearing, external ear and ear canal normal.  Left Ear: Hearing, tympanic membrane, external ear and ear canal normal.  Nose: Mucosal edema (R>L) present. No rhinorrhea. Right sinus exhibits no maxillary sinus tenderness and no frontal sinus tenderness. Left sinus exhibits no maxillary sinus tenderness and no frontal sinus tenderness.  Mouth/Throat: Uvula is midline and mucous membranes are normal. Posterior oropharyngeal erythema present. No oropharyngeal exudate, posterior oropharyngeal edema or tonsillar abscesses.  R cerumen impaction Post nasal drainage present  Eyes: Pupils are equal, round, and reactive to light. Conjunctivae and EOM are normal. No scleral icterus.  Neck: Normal range of  motion. Neck supple.  Cardiovascular: Normal rate, regular rhythm, normal heart sounds and intact distal pulses.  No murmur heard. Pulmonary/Chest: Effort normal. No respiratory distress. He has no decreased breath sounds. He has no wheezes. He has rhonchi in the left lower field. He has no rales.  Lymphadenopathy:    He has no cervical adenopathy.  Skin: Skin is warm and dry. No rash noted.  Nursing note and vitals reviewed.     Assessment & Plan:   Problem List Items Addressed This Visit    Hypertension    Above goal today - he has not taken AM meds yet. Will take when he arrives home, advised start monitoring more regularly at home, to let me know if consistently above goal <140/90.      COPD (chronic obstructive pulmonary disease) (HCC)   Relevant Medications   fluticasone (FLONASE) 50 MCG/ACT nasal spray   Allergic rhinitis, seasonal - Primary    Anticipate acute worsening given history and exam - rec continued zyrtec, add flonase. Given lung findings and h/o COPD, possible emergent bronchitis - provided with WASP for doxy with indications when to fill. Pt agrees with plan. Discussed albuterol use.        Other Visit Diagnoses    Need for influenza vaccination       Relevant Orders   Flu Vaccine QUAD 36+ mos IM (Completed)       Meds ordered this encounter  Medications  . doxycycline (VIBRA-TABS) 100 MG tablet    Sig: Take 1 tablet (100 mg total) by mouth 2 (two) times daily.    Dispense:  14 tablet    Refill:  0  . fluticasone (FLONASE) 50 MCG/ACT nasal spray    Sig: Place 2 sprays into both nostrils daily.    Dispense:  16 g    Refill:  1   Orders Placed This Encounter  Procedures  . Flu Vaccine QUAD 36+ mos IM    Follow up plan: Return if symptoms worsen or fail to improve.  Ria Bush, MD

## 2018-04-20 NOTE — Assessment & Plan Note (Signed)
Above goal today - he has not taken AM meds yet. Will take when he arrives home, advised start monitoring more regularly at home, to let me know if consistently above goal <140/90.

## 2018-04-20 NOTE — Assessment & Plan Note (Addendum)
Anticipate acute worsening given history and exam - rec continued zyrtec, add flonase. Given lung findings and h/o COPD, possible emergent bronchitis - provided with WASP for doxy with indications when to fill. Pt agrees with plan. Discussed albuterol use.

## 2018-05-12 ENCOUNTER — Other Ambulatory Visit: Payer: Self-pay | Admitting: Family Medicine

## 2018-05-19 ENCOUNTER — Other Ambulatory Visit: Payer: Self-pay | Admitting: Family Medicine

## 2018-05-22 NOTE — Telephone Encounter (Signed)
Received E prescribing error. plz phone in to pharmacy.

## 2018-05-22 NOTE — Telephone Encounter (Signed)
Spoke with [facility] asking about Flonase refill.  Says they did receive rx and will fill for pt.

## 2018-06-11 ENCOUNTER — Other Ambulatory Visit: Payer: Self-pay | Admitting: Family Medicine

## 2018-06-11 NOTE — Telephone Encounter (Signed)
Electronic refill request Viagra Last office visit 04/20/18 Last refill 06/08/17 #5/11

## 2018-06-14 ENCOUNTER — Other Ambulatory Visit: Payer: Self-pay | Admitting: Family Medicine

## 2018-06-14 DIAGNOSIS — Z125 Encounter for screening for malignant neoplasm of prostate: Secondary | ICD-10-CM

## 2018-06-14 DIAGNOSIS — E119 Type 2 diabetes mellitus without complications: Secondary | ICD-10-CM

## 2018-06-14 DIAGNOSIS — E0590 Thyrotoxicosis, unspecified without thyrotoxic crisis or storm: Secondary | ICD-10-CM

## 2018-06-14 DIAGNOSIS — I10 Essential (primary) hypertension: Secondary | ICD-10-CM

## 2018-06-14 DIAGNOSIS — E782 Mixed hyperlipidemia: Secondary | ICD-10-CM

## 2018-06-14 NOTE — Addendum Note (Signed)
Addended by: Ria Bush on: 06/14/2018 10:28 AM   Modules accepted: Orders

## 2018-06-15 ENCOUNTER — Ambulatory Visit (INDEPENDENT_AMBULATORY_CARE_PROVIDER_SITE_OTHER): Payer: Medicare Other

## 2018-06-15 VITALS — BP 132/70 | HR 68 | Temp 97.7°F | Ht 70.0 in | Wt 183.5 lb

## 2018-06-15 DIAGNOSIS — Z0000 Encounter for general adult medical examination without abnormal findings: Secondary | ICD-10-CM

## 2018-06-15 DIAGNOSIS — E782 Mixed hyperlipidemia: Secondary | ICD-10-CM | POA: Diagnosis not present

## 2018-06-15 DIAGNOSIS — Z125 Encounter for screening for malignant neoplasm of prostate: Secondary | ICD-10-CM

## 2018-06-15 DIAGNOSIS — E0590 Thyrotoxicosis, unspecified without thyrotoxic crisis or storm: Secondary | ICD-10-CM

## 2018-06-15 DIAGNOSIS — I10 Essential (primary) hypertension: Secondary | ICD-10-CM | POA: Diagnosis not present

## 2018-06-15 DIAGNOSIS — E119 Type 2 diabetes mellitus without complications: Secondary | ICD-10-CM | POA: Diagnosis not present

## 2018-06-15 LAB — MICROALBUMIN / CREATININE URINE RATIO
Creatinine,U: 179.4 mg/dL
Microalb Creat Ratio: 0.4 mg/g (ref 0.0–30.0)
Microalb, Ur: <0.7 mg/dL (ref 0.0–1.9)

## 2018-06-15 LAB — PSA, MEDICARE: PSA: 1.98 ng/mL (ref 0.10–4.00)

## 2018-06-15 LAB — TSH: TSH: 1.78 u[IU]/mL (ref 0.35–4.50)

## 2018-06-15 LAB — LIPID PANEL
Cholesterol: 220 mg/dL — ABNORMAL HIGH (ref 0–200)
HDL: 55.6 mg/dL (ref >39.00)
LDL Cholesterol: 145 mg/dL — ABNORMAL HIGH (ref 0–99)
NonHDL: 164.72
Total CHOL/HDL Ratio: 4
Triglycerides: 100 mg/dL (ref 0.0–149.0)
VLDL: 20 mg/dL (ref 0.0–40.0)

## 2018-06-15 LAB — COMPREHENSIVE METABOLIC PANEL
ALBUMIN: 4 g/dL (ref 3.5–5.2)
ALT: 16 U/L (ref 0–53)
AST: 15 U/L (ref 0–37)
Alkaline Phosphatase: 45 U/L (ref 39–117)
BUN: 15 mg/dL (ref 6–23)
CO2: 31 mEq/L (ref 19–32)
Calcium: 9.6 mg/dL (ref 8.4–10.5)
Chloride: 100 mEq/L (ref 96–112)
Creatinine, Ser: 0.93 mg/dL (ref 0.40–1.50)
GFR: 102.43 mL/min (ref >60.00)
Glucose, Bld: 120 mg/dL — ABNORMAL HIGH (ref 70–99)
Potassium: 3.9 mEq/L (ref 3.5–5.1)
Sodium: 139 mEq/L (ref 135–145)
Total Bilirubin: 0.9 mg/dL (ref 0.2–1.2)
Total Protein: 7.1 g/dL (ref 6.0–8.3)

## 2018-06-15 LAB — HEMOGLOBIN A1C: Hgb A1c MFr Bld: 6.5 % (ref 4.6–6.5)

## 2018-06-15 LAB — T4, FREE: Free T4: 0.86 ng/dL (ref 0.60–1.60)

## 2018-06-15 NOTE — Progress Notes (Signed)
Subjective:   Gregory Abbott is a 72 y.o. male who presents for Medicare Annual/Subsequent preventive examination.  Review of Systems:  N/A Cardiac Risk Factors include: advanced age (>75men, >51 women);male gender;diabetes mellitus;dyslipidemia;hypertension     Objective:    Vitals: BP 132/70 (BP Location: Right Arm, Patient Position: Sitting, Cuff Size: Normal)   Pulse 68   Temp 97.7 F (36.5 C) (Oral)   Ht 5\' 10"  (1.778 m) Comment: shoes  Wt 183 lb 8 oz (83.2 kg)   SpO2 97%   BMI 26.33 kg/m   Body mass index is 26.33 kg/m.  Advanced Directives 05/02/2016 11/07/2013  Does Patient Have a Medical Advance Directive? Yes Patient does not have advance directive  Type of Advance Directive [place];Living will -  Does patient want to make changes to medical advance directive? No - Patient declined -  Copy of [place] in Chart? No - copy requested -  Pre-existing out of facility DNR order (yellow form or pink MOST form) - No    Tobacco Social History   Tobacco Use  Smoking Status Former Smoker  . Packs/day: 0.25  . Types: Cigarettes  . Last attempt to quit: 11/06/2013  . Years since quitting: 4.6  Smokeless Tobacco Never Used     Counseling given: No   Clinical Intake:  Pre-visit preparation completed: Yes  Pain : No/denies pain Pain Score: 0-No pain     Nutritional Status: BMI 25 -29 Overweight Nutritional Risks: None Diabetes: Yes CBG done?: No Did pt. bring in CBG monitor from home?: No  How often do you need to have someone help you when you read instructions, pamphlets, or other written materials from your doctor or pharmacy?: 1 - Never What is the last grade level you completed in school?: Associate degree  Interpreter Needed?: No  Comments: pt lives with spouse Information entered by :: LPinson, LPN  Past Medical History:  Diagnosis Date  . Blood transfusion without reported diagnosis   . BPH (benign  prostatic hypertrophy)   . Cataract   . Childhood asthma   . COPD (chronic obstructive pulmonary disease) ([place])   . Eczema   . Ex-smoker    40 PY hx  . GERD (gastroesophageal reflux disease)   . History of chicken pox   . Hypertension   . Lower back pain    h/o herniated and bulging discs lower back  . Osteoarthritis of neck   . Personal history of colonic polyps 05/07/2013   resected not retrieved  . PUD (peptic ulcer disease)   . S/P splenectomy    partial  . Shortness of breath    Past Surgical History:  Procedure Laterality Date  . AAA screen  04/2013   neg for AAA  . CATARACT EXTRACTION Left 05/2013   [facility]  . CATARACT EXTRACTION W/PHACO Right 10/06/2015   Procedure: CATARACT EXTRACTION PHACO AND INTRAOCULAR LENS PLACEMENT (IOC);  Surgeon: Birder Robson, MD;  Location: [facility] ORS;  Service: Ophthalmology;  Laterality: Right;  [place] AP% CDE fluid pack lot [id number] H exp 04/02/2017  . COLONOSCOPY  2009  . COLONOSCOPY  05/2013   1 polyp, int/ext hemorrhoids Carlean Purl)  . KNEE ARTHROSCOPY    . lower extremity arterial doppler  2013   WNL  . SPLENECTOMY, PARTIAL  1998   fall down stairs   Family History  Problem Relation Age of Onset  . Diabetes Mother   . Hypertension Mother   . Hyperlipidemia Mother   .  Heart disease Mother   . Stroke Father   . Colon cancer Neg Hx   . Rectal cancer Neg Hx   . Stomach cancer Neg Hx   . Esophageal cancer Neg Hx    Social History   Socioeconomic History  . Marital status: Married    Spouse name: Not on file  . Number of children: 2  . Years of education: Not on file  . Highest education level: Not on file  Occupational History  . Occupation: Retired  Scientific laboratory technician  . Financial resource strain: Not on file  . Food insecurity:    Worry: Not on file    Inability: Not on file  . Transportation needs:    Medical: Not on file    Non-medical: Not on file  Tobacco Use  . Smoking status: Former Smoker    Packs/day: 0.25      Types: Cigarettes    Last attempt to quit: 11/06/2013    Years since quitting: 4.6  . Smokeless tobacco: Never Used  Substance and Sexual Activity  . Alcohol use: Yes    Alcohol/week: 2.0 standard drinks    Types: 1 Cans of beer, 1 Shots of liquor per week    Comment: occasional  . Drug use: No  . Sexual activity: Yes  Lifestyle  . Physical activity:    Days per week: Not on file    Minutes per session: Not on file  . Stress: Not on file  Relationships  . Social connections:    Talks on phone: Not on file    Gets together: Not on file    Attends religious service: Not on file    Active member of club or organization: Not on file    Attends meetings of clubs or organizations: Not on file    Relationship status: Not on file  Other Topics Concern  . Not on file  Social History Narrative   Lives with wife, no pets   Occupation: retired, Engineer, mining   Edu: Degree   Activity: housework, no regular exercise   Diet: good water, fruits/vegetables daily    Outpatient Encounter Medications as of 06/15/2018  Medication Sig  . albuterol (PROVENTIL HFA;VENTOLIN HFA) 108 (90 Base) MCG/ACT inhaler Inhale 2 puffs into the lungs every 6 (six) hours as needed for wheezing or shortness of breath.  [name] amLODipine (NORVASC) 5 MG tablet TAKE 1 TABLET BY MOUTH EVERY DAY  . aspirin EC 81 MG tablet Take 81 mg by mouth every other day.  [name] atenolol (TENORMIN) 100 MG tablet TAKE 1 TABLET (100 MG TOTAL) BY MOUTH DAILY AFTER BREAKFAST.  . clobetasol cream (TEMOVATE) 1.61 % Apply 1 application topically 2 (two) times daily. Apply to AA for hands, no more than 2 wks at a time  . clotrimazole-betamethasone (LOTRISONE) cream Apply 1 application topically 2 (two) times daily as needed.  . DiphenhydrAMINE HCl (BENADRYL ALLERGY PO) Take 1 tablet by mouth daily as needed.  . hydrochlorothiazide (HYDRODIURIL) 25 MG tablet TAKE 1 TABLET BY MOUTH EVERY DAY  . loratadine (CLARITIN) 10 MG tablet Take 1 tablet (10 mg  total) by mouth daily. (Patient taking differently: Take 10 mg by mouth daily as needed. )  . omeprazole (PRILOSEC) 40 MG capsule Take 1 capsule (40 mg total) by mouth daily.  . sildenafil (VIAGRA) 100 MG tablet TAKE 0.5-1 TABLETS (50-100 MG TOTAL) BY MOUTH DAILY AS NEEDED FOR ERECTILE DYSFUNCTION.  . [DISCONTINUED] doxycycline (VIBRA-TABS) 100 MG tablet Take 1 tablet (100 mg total) by  mouth 2 (two) times daily.  . [DISCONTINUED] fluticasone (FLONASE) 50 MCG/ACT nasal spray SPRAY 2 SPRAYS INTO EACH NOSTRIL EVERY DAY   No facility-administered encounter medications on file as of 06/15/2018.     Activities of Daily Living In your present state of health, do you have any difficulty performing the following activities: 06/15/2018  Hearing? Y  Vision? N  Difficulty concentrating or making decisions? N  Walking or climbing stairs? N  Dressing or bathing? N  Doing errands, shopping? N  Preparing Food and eating ? N  Using the Toilet? N  In the past six months, have you accidently leaked urine? N  Do you have problems with loss of bowel control? N  Managing your Medications? N  Managing your Finances? N  Housekeeping or managing your Housekeeping? N  Some recent data might be hidden    Patient Care Team: Ria Bush, MD as PCP - General (Family Medicine) Birder Robson, MD as Referring Physician (Ophthalmology)   Assessment:   This is a routine wellness examination for [place].   Hearing Screening   125Hz  250Hz  500Hz  1000Hz  2000Hz  3000Hz  4000Hz  6000Hz  8000Hz   Right ear:   40 0 0  0    Left ear:   40 40 40  0      Visual Acuity Screening   Right eye Left eye Both eyes  Without correction:     With correction: 20/25-1 20/20-1 20/20-1    Exercise Activities and Dietary recommendations Current Exercise Habits: Home exercise routine, Type of exercise: walking, Time (Minutes): 30, Frequency (Times/Week): 2, Weekly Exercise (Minutes/Week): 60, Intensity: Mild, Exercise limited  by: None identified  Goals    . Increase physical activity     Starting 06/15/2018, I will continue to walk at least 30 min 2 days per week.        Fall Risk Fall Risk  06/15/2018 06/08/2017 05/02/2016 04/03/2015 02/01/2013  Falls in the past year? 0 No No No No   Depression Screen PHQ 2/9 Scores 06/15/2018 06/08/2017 05/02/2016 04/03/2015  PHQ - 2 Score 0 0 0 0  PHQ- 9 Score 0 - - -    Cognitive Function MMSE - Mini Mental State Exam 06/15/2018 05/02/2016  Orientation to time 5 5  Orientation to Place 5 5  Registration 3 3  Attention/ Calculation 0 0  Recall 3 3  Language- name 2 objects 0 0  Language- repeat 1 1  Language- follow 3 step command 3 3  Language- read & follow direction 0 0  Write a sentence 0 0  Copy design 0 0  Total score 20 20     PLEASE NOTE: A Mini-Cog screen was completed. Maximum score is 20. A value of 0 denotes this part of [place] MMSE was not completed or the patient failed this part of the Mini-Cog screening.   Mini-Cog Screening Orientation to Time - Max 5 pts Orientation to Place - Max 5 pts Registration - Max 3 pts Recall - Max 3 pts Language Repeat - Max 1 pts Language Follow 3 Step Command - Max 3 pts     Immunization History  Administered Date(s) Administered  . Influenza, High Dose Seasonal PF 04/05/2017  . Influenza,inj,Quad PF,6+ Mos 03/30/2015, 05/02/2016, 04/20/2018  . Influenza-Unspecified 04/03/2014, 05/04/2014  . Pneumococcal Conjugate-13 11/14/2013  . Pneumococcal Polysaccharide-23 04/03/2015  . Tdap 01/31/2011    Screening Tests Health Maintenance  Topic Date Due  . FOOT EXAM  06/18/2018 (Originally 08/09/2017)  . OPHTHALMOLOGY EXAM  07/04/2019 (Originally 08/04/2017)  .  COLONOSCOPY  07/04/2019 (Originally 05/07/2018)  . HEMOGLOBIN A1C  12/15/2018  . URINE MICROALBUMIN  06/16/2019  . DTaP/Tdap/Td (2 - Td) 01/30/2021  . TETANUS/TDAP  01/30/2021  . INFLUENZA VACCINE  Completed  . Hepatitis C Screening  Completed  .  PNA vac Low Risk Adult  Completed       Plan:    I have personally reviewed, addressed, and noted the following in the patient's chart:  A. Medical and social history B. Use of alcohol, tobacco or illicit drugs  C. Current medications and supplements D. Functional ability and status E.  Nutritional status F.  Physical activity G. Advance directives H. List of other physicians I.  Hospitalizations, surgeries, and ER visits in previous 12 months J.  [place] to include hearing, vision, cognitive, depression L. Referrals and appointments - none  In addition, I have reviewed and discussed with patient certain preventive protocols, quality metrics, and best practice recommendations. A written personalized care plan for preventive services as well as general preventive health recommendations were provided to patient.  See attached scanned questionnaire for additional information.   Signed,   Lindell Noe, MHA, BS, LPN Health Coach

## 2018-06-15 NOTE — Progress Notes (Signed)
PCP notes:   Health maintenance:  Foot exam - PCP follow-up requested Eye exam - addressed Colonoscopy - adressed A1C - completed Microalbumin - completed  Abnormal screenings:   Hearing - failed  Hearing Screening   125Hz  250Hz  500Hz  1000Hz  2000Hz  3000Hz  4000Hz  6000Hz  8000Hz   Right ear:   40 0 0  0    Left ear:   40 40 40  0      Visual Acuity Screening   Right eye Left eye Both eyes  Without correction:     With correction: 20/25-1 20/20-1 20/20-1  ' Patient concerns:   None  Nurse concerns:  None  Next PCP appt:   06/18/18 @ 1030

## 2018-06-15 NOTE — Patient Instructions (Signed)
[name] , Thank you for taking time to come for your Medicare Wellness Visit. I appreciate your ongoing commitment to your health goals. Please review the following plan we discussed and let me know if I can assist you in the future.   These are the goals we discussed: Goals    . Increase physical activity     Starting 06/15/2018, I will continue to walk at least 30 min 2 days per week.        This is a list of the screening recommended for you and due dates:  Health Maintenance  Topic Date Due  . Complete foot exam   06/18/2018*  . Eye exam for diabetics  07/04/2019*  . Colon Cancer Screening  07/04/2019*  . Hemoglobin A1C  12/15/2018  . Urine Protein Check  06/16/2019  . DTaP/Tdap/Td vaccine (2 - Td) 01/30/2021  . Tetanus Vaccine  01/30/2021  . Flu Shot  Completed  .  Hepatitis C: One time screening is recommended by Center for Disease Control  (CDC) for  adults born from [birth year] through 1966.   Completed  . Pneumonia vaccines  Completed  *Topic was postponed. The date shown is not the original due date.   Preventive Care for Adults  A healthy lifestyle and preventive care can promote health and wellness. Preventive health guidelines for adults include the following key practices.  . A routine yearly physical is a good way to check with your health care provider about your health and preventive screening. It is a chance to share any concerns and updates on your health and to receive a thorough exam.  . Visit your dentist for a routine exam and preventive care every 6 months. Brush your teeth twice a day and floss once a day. Good oral hygiene prevents tooth decay and gum disease.  . The frequency of eye exams is based on your age, health, family medical history, use  of contact lenses, and other factors. Follow your health care provider's recommendations for frequency of eye exams.  . Eat a healthy diet. Foods like vegetables, fruits, whole grains, low-fat dairy products, and  lean protein foods contain the nutrients you need without too many calories. Decrease your intake of foods high in solid fats, added sugars, and salt. Eat the right amount of calories for you. Get information about a proper diet from your health care provider, if necessary.  . Regular physical exercise is one of the most important things you can do for your health. Most adults should get at least 150 minutes of moderate-intensity exercise (any activity that increases your heart rate and causes you to sweat) each week. In addition, most adults need muscle-strengthening exercises on 2 or more days a week.  Silver Sneakers may be a benefit available to you. To determine eligibility, you may visit the website: www.silversneakers.com or contact program at 873-080-8538 Mon-Fri between 8AM-8PM.   . Maintain a healthy weight. The body mass index (BMI) is a screening tool to identify possible weight problems. It provides an estimate of body fat based on height and weight. Your health care provider can find your BMI and can help you achieve or maintain a healthy weight.   For adults 20 years and older: ? A BMI below 18.5 is considered underweight. ? A BMI of 18.5 to 24.9 is normal. ? A BMI of 25 to 29.9 is considered overweight. ? A BMI of 30 and above is considered obese.   . Maintain normal blood lipids and cholesterol  levels by exercising and minimizing your intake of saturated fat. Eat a balanced diet with plenty of fruit and vegetables. Blood tests for lipids and cholesterol should begin at age 53 and be repeated every 5 years. If your lipid or cholesterol levels are high, you are over 50, or you are at high risk for heart disease, you may need your cholesterol levels checked more frequently. Ongoing high lipid and cholesterol levels should be treated with medicines if diet and exercise are not working.  . If you smoke, find out from your health care provider how to quit. If you do not use tobacco,  please do not start.  . If you choose to drink alcohol, please do not consume more than 2 drinks per day. One drink is considered to be 12 ounces (355 mL) of beer, 5 ounces (148 mL) of wine, or 1.5 ounces (44 mL) of liquor.  . If you are 52-44 years old, ask your health care provider if you should take aspirin to prevent strokes.  . Use sunscreen. Apply sunscreen liberally and repeatedly throughout the day. You should seek shade when your shadow is shorter than you. Protect yourself by wearing long sleeves, pants, a wide-brimmed hat, and sunglasses year round, whenever you are outdoors.  . Once a month, do a whole body skin exam, using a mirror to look at the skin on your back. Tell your health care provider of new moles, moles that have irregular borders, moles that are larger than a pencil eraser, or moles that have changed in shape or color.

## 2018-06-17 NOTE — Progress Notes (Signed)
BP 136/72 (BP Location: Left Arm, Patient Position: Sitting, Cuff Size: Normal)   Pulse 68   Temp 97.8 F (36.6 C) (Oral)   Ht 5\' 10"  (1.778 m)   Wt 187 lb 8 oz (85 kg)   SpO2 96%   BMI 26.90 kg/m    CC: AMW f/u visit Subjective:    Patient ID: Gregory Abbott, male    DOB: 10-Jan-1946, 72 y.o.   MRN: [id number]  HPI: Gregory Abbott is a 72 y.o. male presenting on 06/18/2018 for Annual Exam (Pt 2. )   Saw Katha Cabal last week for medicare wellness visit. Note reviewed. Failed hearing screen on right. Pt denies trouble hearing. He has wax remover drops he uses.   Preventative: COLONOSCOPY Date: 05/2013 1 polyp, int/ext hemorrhoids. Small transverse polyp removed but not recovered - consider rpt 2019([facility]).  Prostate cancer screening - discussed, normal DRE 2017, skipped last year. Update this year.  Lung cancer screening - ~25 PY hx - not eligible. Quit ~2014  Flu shot - yearly Tdap - 2012 prevnar 11/2013, pneumovax2016 - s/p splenectomy will need Q5 yrs  Shingrix - discussed  Advanced directives - daughterto be HCPOA- new advanced directive packet previously provided.asked to bring [place] copy for chart.  Seat belt use discussed.  Sunscreen use discussed. No changing moles on skin.  Ex smoker - quit ~2015. Prior 2 ppwk.  Alcohol - occasional (backed off due to itchy rash).  Dentist - seen early 2019 in [place] Due for f/u with eye doctor.   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 Interim medical history since our last visit reviewed. Allergies and medications reviewed and updated. Outpatient Medications Prior to Visit  Medication Sig Dispense Refill  . albuterol (PROVENTIL HFA;VENTOLIN HFA) 108 (90 Base) MCG/ACT inhaler Inhale 2 puffs into the lungs every 6 (six) hours as  needed for wheezing or shortness of breath. 1 Inhaler 3  . amLODipine (NORVASC) 5 MG tablet TAKE 1 TABLET BY MOUTH EVERY DAY 90 tablet 1  . aspirin EC 81 MG tablet Take 81 mg by mouth every other day.    [name] atenolol (TENORMIN) 100 MG tablet TAKE 1 TABLET (100 MG TOTAL) BY MOUTH DAILY AFTER BREAKFAST. 30 tablet 5  . clobetasol cream (TEMOVATE) 7.01 % Apply 1 application topically 2 (two) times daily. Apply to AA for hands, no more than 2 wks at a time 60 g 0  . clotrimazole-betamethasone (LOTRISONE) cream Apply 1 application topically 2 (two) times daily as needed. 45 g 0  . DiphenhydrAMINE HCl (BENADRYL ALLERGY PO) Take 1 tablet by mouth daily as needed.    . hydrochlorothiazide (HYDRODIURIL) 25 MG tablet TAKE 1 TABLET BY MOUTH EVERY DAY 90 tablet 1  . loratadine (CLARITIN) 10 MG tablet Take 1 tablet (10 mg total) by mouth daily. (Patient taking differently: Take 10 mg by mouth daily as needed. )    . omeprazole (PRILOSEC) 40 MG capsule Take 1 capsule (40 mg total) by mouth daily. 90 capsule 3  . sildenafil (VIAGRA) 100 MG tablet TAKE 0.5-1 TABLETS (50-100 MG TOTAL) BY MOUTH DAILY AS NEEDED FOR ERECTILE DYSFUNCTION. 5 tablet 11   No facility-administered medications prior to visit.      Per HPI unless specifically indicated in ROS section below Review of Systems     Objective:    BP  136/72 (BP Location: Left Arm, Patient Position: Sitting, Cuff Size: Normal)   Pulse 68   Temp 97.8 F (36.6 C) (Oral)   Ht 5\' 10"  (1.778 m)   Wt 187 lb 8 oz (85 kg)   SpO2 96%   BMI 26.90 kg/m   Wt Readings from Last 3 Encounters:  06/18/18 187 lb 8 oz (85 kg)  06/15/18 183 lb 8 oz (83.2 kg)  04/20/18 174 lb 8 oz (79.2 kg)    Physical Exam Vitals signs and nursing note reviewed.  Constitutional:      General: He is not in acute distress.    Appearance: He is well-developed.  HENT:     Head: Normocephalic and atraumatic.     Right Ear: Hearing, ear canal and external ear normal. There is impacted  cerumen.     Left Ear: Hearing, tympanic membrane, ear canal and external ear normal.     Ears:     Comments: R cerumen impaction s/p irrigation in office today     Nose: Nose normal.     Mouth/Throat:     Pharynx: Uvula midline. No oropharyngeal exudate or posterior oropharyngeal erythema.  Eyes:     General: No scleral icterus.    Conjunctiva/sclera: Conjunctivae normal.     Pupils: Pupils are equal, round, and reactive to light.  Neck:     Musculoskeletal: Normal range of motion and neck supple.     Vascular: No carotid bruit.  Cardiovascular:     Rate and Rhythm: Normal rate and regular rhythm.     Pulses:          Radial pulses are 2+ on the right side and 2+ on the left side.     Heart sounds: Normal heart sounds. No murmur.  Pulmonary:     Effort: Pulmonary effort is normal. No respiratory distress.     Breath sounds: Normal breath sounds. No wheezing or rales.  Abdominal:     General: Bowel sounds are normal. There is no distension.     Palpations: Abdomen is soft. There is no mass.     Tenderness: There is no abdominal tenderness. There is no guarding or rebound.  Genitourinary:    Prostate: Normal. Not enlarged (20gm), not tender and no nodules present.     Rectum: Normal. No mass, tenderness, anal fissure, external hemorrhoid or internal hemorrhoid. Normal anal tone.  Musculoskeletal: Normal range of motion.  Lymphadenopathy:     Cervical: No cervical adenopathy.  Skin:    General: Skin is warm and dry.     Findings: No rash.  Neurological:     Mental Status: He is alert and oriented to person, place, and time.     Comments: CN grossly intact, station and gait intact  Psychiatric:        Behavior: Behavior normal.        Thought Content: Thought content normal.        Judgment: Judgment normal.    Results for orders placed or performed in visit on 06/15/18  PSA, Medicare  Result Value Ref Range   PSA 1.98 0.10 - 4.00 ng/ml  Microalbumin / creatinine urine  ratio  Result Value Ref Range   Microalb, Ur <0.7 0.0 - 1.9 mg/dL   Creatinine,U 179.4 mg/dL   Microalb Creat Ratio 0.4 0.0 - 30.0 mg/g  Hemoglobin A1c  Result Value Ref Range   Hgb A1c MFr Bld 6.5 4.6 - 6.5 %  T4, free  Result Value Ref Range  Free T4 0.86 0.60 - 1.60 ng/dL  TSH  Result Value Ref Range   TSH 1.78 0.35 - 4.50 uIU/mL  Comprehensive metabolic panel  Result Value Ref Range   Sodium 139 135 - 145 mEq/L   Potassium 3.9 3.5 - 5.1 mEq/L   Chloride 100 96 - 112 mEq/L   CO2 31 19 - 32 mEq/L   Glucose, Bld 120 (H) 70 - 99 mg/dL   BUN 15 6 - 23 mg/dL   Creatinine, Ser 0.93 0.40 - 1.50 mg/dL   Total Bilirubin 0.9 0.2 - 1.2 mg/dL   Alkaline Phosphatase 45 39 - 117 U/L   AST 15 0 - 37 U/L   ALT 16 0 - 53 U/L   Total Protein 7.1 6.0 - 8.3 g/dL   Albumin 4.0 3.5 - 5.2 g/dL   Calcium 9.6 8.4 - 10.5 mg/dL   GFR 102.43 >60.00 mL/min  Lipid panel  Result Value Ref Range   Cholesterol 220 (H) 0 - 200 mg/dL   Triglycerides 100.0 0.0 - 149.0 mg/dL   HDL 55.60 >39.00 mg/dL   VLDL 20.0 0.0 - 40.0 mg/dL   LDL Cholesterol 145 (H) 0 - 99 mg/dL   Total CHOL/HDL Ratio 4    NonHDL 164.72       Assessment & Plan:   Problem List Items Addressed This Visit    Subclinical hyperthyroidism    Thyroid function labs stable.       Mixed hyperlipidemia    Chronic, discussed indications for statin. Will start lipitor 20mg  daily. The 10-year ASCVD risk score Mikey Bussing DC Brooke Bonito., et al., 2013) is: 39.2%   Values used to calculate the score:     Age: 50 years     Sex: Male     Is Non-Hispanic African American: Yes     Diabetic: Yes     Tobacco smoker: No     Systolic Blood Pressure: 622 mmHg     Is BP treated: Yes     HDL Cholesterol: 55.6 mg/dL     Total Cholesterol: 220 mg/dL       Relevant Medications   sildenafil (VIAGRA) 100 MG tablet   atorvastatin (LIPITOR) 20 MG tablet   Hypertension    Chronic, stable. Continue current regimen.       Relevant Medications   sildenafil  (VIAGRA) 100 MG tablet   atorvastatin (LIPITOR) 20 MG tablet   Hearing loss of right ear due to cerumen impaction    Irrigation performed today without much success - he will continue using home debrox.       GERD (gastroesophageal reflux disease)    Chronic, stable on PPI daily.       Ex-smoker    Remains abstinent.       Relevant Orders   POCT Urinalysis Dipstick (Automated)   Erectile dysfunction of organic origin   COPD (chronic obstructive pulmonary disease) (HCC)    Stable period off respiratory medications.       Controlled type 2 diabetes mellitus without complication (HCC) - Primary    Chronic, stable. Diet controlled. Continue to monitor.      Relevant Medications   atorvastatin (LIPITOR) 20 MG tablet   Advanced care planning/counseling discussion    Advanced directives - daughterto be HCPOA- advanced directive packet previously provided.Asked to bring [place] copy for chart.        Other Visit Diagnoses    Special screening for malignant neoplasms, colon       Relevant Orders   Ambulatory  referral to Gastroenterology       Meds ordered this encounter  Medications  . sildenafil (VIAGRA) 100 MG tablet    Sig: Take 0.5-1 tablets (50-100 mg total) by mouth daily as needed for erectile dysfunction.    Dispense:  5 tablet    Refill:  11  . atorvastatin (LIPITOR) 20 MG tablet    Sig: Take 1 tablet (20 mg total) by mouth daily.    Dispense:  90 tablet    Refill:  3   Orders Placed This Encounter  Procedures  . Ambulatory referral to Gastroenterology    Referral Priority:   Routine    Referral Type:   Consultation    Referral Reason:   Specialty Services Required    Number of Visits Requested:   1  . POCT Urinalysis Dipstick (Automated)    Follow up plan: Return in about 6 months (around 12/18/2018) for follow up visit.  Ria Bush, MD

## 2018-06-18 ENCOUNTER — Ambulatory Visit (INDEPENDENT_AMBULATORY_CARE_PROVIDER_SITE_OTHER): Payer: Medicare Other | Admitting: Family Medicine

## 2018-06-18 ENCOUNTER — Encounter: Payer: Self-pay | Admitting: Family Medicine

## 2018-06-18 VITALS — BP 136/72 | HR 68 | Temp 97.8°F | Ht 70.0 in | Wt 187.5 lb

## 2018-06-18 DIAGNOSIS — Z7189 Other specified counseling: Secondary | ICD-10-CM | POA: Diagnosis not present

## 2018-06-18 DIAGNOSIS — E119 Type 2 diabetes mellitus without complications: Secondary | ICD-10-CM

## 2018-06-18 DIAGNOSIS — E0590 Thyrotoxicosis, unspecified without thyrotoxic crisis or storm: Secondary | ICD-10-CM | POA: Diagnosis not present

## 2018-06-18 DIAGNOSIS — Z1211 Encounter for screening for malignant neoplasm of colon: Secondary | ICD-10-CM | POA: Diagnosis not present

## 2018-06-18 DIAGNOSIS — H6121 Impacted cerumen, right ear: Secondary | ICD-10-CM | POA: Insufficient documentation

## 2018-06-18 DIAGNOSIS — I10 Essential (primary) hypertension: Secondary | ICD-10-CM

## 2018-06-18 DIAGNOSIS — J449 Chronic obstructive pulmonary disease, unspecified: Secondary | ICD-10-CM

## 2018-06-18 DIAGNOSIS — N529 Male erectile dysfunction, unspecified: Secondary | ICD-10-CM

## 2018-06-18 DIAGNOSIS — K219 Gastro-esophageal reflux disease without esophagitis: Secondary | ICD-10-CM

## 2018-06-18 DIAGNOSIS — Z87891 Personal history of nicotine dependence: Secondary | ICD-10-CM | POA: Diagnosis not present

## 2018-06-18 DIAGNOSIS — E782 Mixed hyperlipidemia: Secondary | ICD-10-CM

## 2018-06-18 LAB — POC URINALSYSI DIPSTICK (AUTOMATED)
BILIRUBIN UA: NEGATIVE
Blood, UA: NEGATIVE
GLUCOSE UA: NEGATIVE
Ketones, UA: NEGATIVE
Leukocytes, UA: NEGATIVE
Nitrite, UA: NEGATIVE
Protein, UA: NEGATIVE
Spec Grav, UA: 1.02 (ref 1.010–1.025)
Urobilinogen, UA: 0.2 E.U./dL
pH, UA: 6 (ref 5.0–8.0)

## 2018-06-18 MED ORDER — SILDENAFIL CITRATE 100 MG PO TABS: 50.0000 mg | ORAL_TABLET | Freq: Every day | ORAL | 11 refills | Status: DC | PRN

## 2018-06-18 MED ORDER — ATORVASTATIN CALCIUM 20 MG PO TABS: 20.0000 mg | ORAL_TABLET | Freq: Every day | ORAL | 3 refills | Status: DC

## 2018-06-18 NOTE — Assessment & Plan Note (Signed)
Remains abstinent 

## 2018-06-18 NOTE — Patient Instructions (Addendum)
Urinalysis today. We will refer you for repeat colonoscopy.  If interested, check with pharmacy about new 2 shot shingles series (shingrix).  Sugar was doing better - remaining in diet-controlled diabetes range. Watch added sugars and carbs in diet to keep diabetes under control. Consider eye exam this coming year.  Cholesterol was too high especially LDL (goal <100) - trial lipitor 20mg  daily. Let me know if any trouble with this medicine.  You are doing well Return as needed or in 6 months for diabetes check  Health Maintenance, Male A healthy lifestyle and preventive care is important for your health and wellness. Ask your health care provider about what schedule of regular examinations is right for you. What should I know about weight and diet? Eat a Healthy Diet  Eat plenty of vegetables, fruits, whole grains, low-fat dairy products, and lean protein.  Do not eat a lot of foods high in solid fats, added sugars, or salt.  Maintain a Healthy Weight Regular exercise can help you achieve or maintain a healthy weight. You should:  Do at least 150 minutes of exercise each week. The exercise should increase your heart rate and make you sweat (moderate-intensity exercise).  Do strength-training exercises at least twice a week.  Watch Your Levels of Cholesterol and Blood Lipids  Have your blood tested for lipids and cholesterol every 5 years starting at 73 years of age. If you are at high risk for heart disease, you should start having your blood tested when you are 73 years old. You may need to have your cholesterol levels checked more often if: ? Your lipid or cholesterol levels are high. ? You are older than 73 years of age. ? You are at high risk for heart disease.  What should I know about cancer screening? Many types of cancers can be detected early and may often be prevented. Lung Cancer  You should be screened every year for lung cancer if: ? You are a current smoker who has  smoked for at least 30 years. ? You are a former smoker who has quit within the past 15 years.  Talk to your health care provider about your screening options, when you should start screening, and how often you should be screened.  Colorectal Cancer  Routine colorectal cancer screening usually begins at 73 years of age and should be repeated every 5-10 years until you are 73 years old. You may need to be screened more often if early forms of precancerous polyps or small growths are found. Your health care provider may recommend screening at an earlier age if you have risk factors for colon cancer.  Your health care provider may recommend using home test kits to check for hidden blood in the stool.  A small camera at the end of a tube can be used to examine your colon (sigmoidoscopy or colonoscopy). This checks for the earliest forms of colorectal cancer.  Prostate and Testicular Cancer  Depending on your age and overall health, your health care provider may do certain tests to screen for prostate and testicular cancer.  Talk to your health care provider about any symptoms or concerns you have about testicular or prostate cancer.  Skin Cancer  Check your skin from head to toe regularly.  Tell your health care provider about any new moles or changes in moles, especially if: ? There is a change in a mole's size, shape, or color. ? You have a mole that is larger than a pencil eraser.  Always use sunscreen. Apply sunscreen liberally and repeat throughout the day.  Protect yourself by wearing long sleeves, pants, a wide-brimmed hat, and sunglasses when outside.  What should I know about heart disease, diabetes, and high blood pressure?  If you are 88-30 years of age, have your blood pressure checked every 3-5 years. If you are 36 years of age or older, have your blood pressure checked every year. You should have your blood pressure measured twice-once when you are at a hospital or clinic,  and once when you are not at a hospital or clinic. Record the average of the two measurements. To check your blood pressure when you are not at a hospital or clinic, you can use: ? An automated blood pressure machine at a pharmacy. ? A home blood pressure monitor.  Talk to your health care provider about your target blood pressure.  If you are between 19-37 years old, ask your health care provider if you should take aspirin to prevent heart disease.  Have regular diabetes screenings by checking your fasting blood sugar level. ? If you are at a normal weight and have a low risk for diabetes, have this test once every three years after the age of 92. ? If you are overweight and have a high risk for diabetes, consider being tested at a younger age or more often.  A one-time screening for abdominal aortic aneurysm (AAA) by ultrasound is recommended for men aged 64-75 years who are current or former smokers. What should I know about preventing infection? Hepatitis B If you have a higher risk for hepatitis B, you should be screened for this virus. Talk with your health care provider to find out if you are at risk for hepatitis B infection. Hepatitis C Blood testing is recommended for:  Everyone born from [birth year] through 1966.  Anyone with known risk factors for hepatitis C.  Sexually Transmitted Diseases (STDs)  You should be screened each year for STDs including gonorrhea and chlamydia if: ? You are sexually active and are younger than 73 years of age. ? You are older than 73 years of age and your health care provider tells you that you are at risk for this type of infection. ? Your sexual activity has changed since you were last screened and you are at an increased risk for chlamydia or gonorrhea. Ask your health care provider if you are at risk.  Talk with your health care provider about whether you are at high risk of being infected with HIV. Your health care provider may recommend a  prescription medicine to help prevent HIV infection.  What else can I do?  Schedule regular health, dental, and eye exams.  Stay current with your vaccines (immunizations).  Do not use any tobacco products, such as cigarettes, chewing tobacco, and e-cigarettes. If you need help quitting, ask your health care provider.  Limit alcohol intake to no more than 2 drinks per day. One drink equals 12 ounces of beer, 5 ounces of wine, or 1 ounces of hard liquor.  Do not use street drugs.  Do not share needles.  Ask your health care provider for help if you need support or information about quitting drugs.  Tell your health care provider if you often feel depressed.  Tell your health care provider if you have ever been abused or do not feel safe at home. This information is not intended to replace advice given to you by your health care provider. Make sure you discuss any  questions you have with your health care provider. Document Released: 12/17/2007 Document Revised: 02/17/2016 Document Reviewed: 03/24/2015 Elsevier Interactive Patient Education  [employer].

## 2018-06-18 NOTE — Assessment & Plan Note (Signed)
Thyroid function labs stable.

## 2018-06-18 NOTE — Assessment & Plan Note (Signed)
Chronic, stable on PPI daily.

## 2018-06-18 NOTE — Assessment & Plan Note (Signed)
Chronic, discussed indications for statin. Will start lipitor 20mg  daily. The 10-year ASCVD risk score Gregory Abbott DC Gregory Abbott., et al., 2013) is: 39.2%   Values used to calculate the score:     Age: 72 years     Sex: Male     Is Non-Hispanic African American: Yes     Diabetic: Yes     Tobacco smoker: No     Systolic Blood Pressure: 316 mmHg     Is BP treated: Yes     HDL Cholesterol: 55.6 mg/dL     Total Cholesterol: 220 mg/dL

## 2018-06-18 NOTE — Assessment & Plan Note (Addendum)
Chronic, stable. Diet controlled. Continue to monitor.

## 2018-06-18 NOTE — Assessment & Plan Note (Signed)
Stable period off respiratory medications.  

## 2018-06-18 NOTE — Assessment & Plan Note (Signed)
Chronic, stable. Continue current regimen. 

## 2018-06-18 NOTE — Assessment & Plan Note (Addendum)
Irrigation performed today without much success - he will continue using home debrox.

## 2018-06-18 NOTE — Assessment & Plan Note (Signed)
Advanced directives - daughterto be HCPOA- advanced directive packet previously provided.Asked to bring [place] copy for chart.

## 2018-07-06 ENCOUNTER — Encounter: Payer: Self-pay | Admitting: Internal Medicine

## 2018-07-27 ENCOUNTER — Other Ambulatory Visit: Payer: Self-pay | Admitting: Family Medicine

## 2018-08-01 ENCOUNTER — Other Ambulatory Visit: Payer: Self-pay

## 2018-08-01 ENCOUNTER — Ambulatory Visit (AMBULATORY_SURGERY_CENTER): Payer: Self-pay

## 2018-08-01 VITALS — Ht 72.0 in | Wt 187.6 lb

## 2018-08-01 DIAGNOSIS — Z86010 Personal history of colonic polyps: Secondary | ICD-10-CM

## 2018-08-01 NOTE — Progress Notes (Signed)
No egg or soy allergy known to patient  No issues with past sedation with any surgeries  or procedures, no intubation problems  No diet pills per patient No home 02 use per patient  No blood thinners per patient  Pt denies issues with constipation  No A fib or A flutter  EMMI video sent to pt's e mail , pt declined    

## 2018-08-04 HISTORY — PX: COLONOSCOPY: SHX174

## 2018-08-06 ENCOUNTER — Other Ambulatory Visit: Payer: Self-pay | Admitting: Family Medicine

## 2018-08-13 ENCOUNTER — Other Ambulatory Visit: Payer: Self-pay | Admitting: Family Medicine

## 2018-08-15 ENCOUNTER — Ambulatory Visit (AMBULATORY_SURGERY_CENTER): Payer: Medicare Other | Admitting: Internal Medicine

## 2018-08-15 ENCOUNTER — Encounter: Payer: Self-pay | Admitting: Internal Medicine

## 2018-08-15 VITALS — BP 106/55 | HR 60 | Temp 98.0°F | Resp 21 | Wt 187.0 lb

## 2018-08-15 DIAGNOSIS — Z86010 Personal history of colonic polyps: Secondary | ICD-10-CM | POA: Diagnosis not present

## 2018-08-15 DIAGNOSIS — D125 Benign neoplasm of sigmoid colon: Secondary | ICD-10-CM

## 2018-08-15 DIAGNOSIS — K635 Polyp of colon: Secondary | ICD-10-CM

## 2018-08-15 MED ORDER — SODIUM CHLORIDE 0.9 % IV SOLN: 500.0000 mL | Freq: Once | INTRAVENOUS | Status: DC

## 2018-08-15 NOTE — Progress Notes (Signed)
PT taken to PACU. Monitors in place. VSS. Report given to RN. 

## 2018-08-15 NOTE — Progress Notes (Signed)
I have reviewed the patient's medical history in detail and updated the computerized patient record.

## 2018-08-15 NOTE — Op Note (Signed)
[facility] Patient Name: Gregory Abbott Procedure Date: 08/15/2018 9:10 AM MRN: [id number] Endoscopist: Gatha Mayer , MD Age: 73 Referring MD:  Date of Birth: 02/03/1946 Gender: Male Account #: [id number] Procedure:                Colonoscopy Indications:              High risk colon cancer surveillance: Personal                            history of colonic polyps Medicines:                Propofol per Anesthesia, Monitored Anesthesia Care Procedure:                Pre-Anesthesia Assessment:                           - Prior to the procedure, a History and Physical                            was performed, and patient medications and                            allergies were reviewed. The patient's tolerance of                            previous anesthesia was also reviewed. The risks                            and benefits of the procedure and the sedation                            options and risks were discussed with the patient.                            All questions were answered, and informed consent                            was obtained. Prior Anticoagulants: The patient has                            taken no previous anticoagulant or antiplatelet                            agents. ASA Grade Assessment: II - A patient with                            mild systemic disease. After reviewing the risks                            and benefits, the patient was deemed in                            satisfactory condition to undergo the procedure.  After obtaining informed consent, the colonoscope                            was passed under direct vision. Throughout the                            procedure, the patient's blood pressure, pulse, and                            oxygen saturations were monitored continuously. The                            Model CF-HQ190L [id number]) scope was introduced                            through the  anus and advanced to the the cecum,                            identified by appendiceal orifice and ileocecal                            valve. The colonoscopy was performed without                            difficulty. The patient tolerated the procedure                            well. The quality of the bowel preparation was                            good. The bowel preparation used was Miralax. The                            ileocecal valve, appendiceal orifice, and rectum                            were photographed. Scope In: 9:17:51 AM Scope Out: 9:30:45 AM Scope Withdrawal Time: 0 hours 9 minutes 18 seconds  Total Procedure Duration: 0 hours 12 minutes 54 seconds  Findings:                 The perianal and digital rectal examinations were                            normal. Pertinent negatives include normal prostate                            (size, shape, and consistency).                           A diminutive polyp was found in the sigmoid colon.                            The polyp was sessile. The polyp was removed with a  cold snare. Resection and retrieval were complete.                            Verification of patient identification for the                            specimen was done. Estimated blood loss was minimal.                           The exam was otherwise without abnormality on                            direct and retroflexion views. Complications:            No immediate complications. Estimated Blood Loss:     Estimated blood loss was minimal. Impression:               - One diminutive polyp in the sigmoid colon,                            removed with a cold snare. Resected and retrieved.                           - The examination was otherwise normal on direct                            and retroflexion views.                           - Personal history of colonic polyps. Polyp removed                             2014. Recommendation:           - Patient has a contact number available for                            emergencies. The signs and symptoms of potential                            delayed complications were discussed with the                            patient. Return to normal activities tomorrow.                            Written discharge instructions were provided to the                            patient.                           - Resume previous diet.                           - Continue present medications.                           -  Await pathology results. Gatha Mayer, MD 08/15/2018 9:42:33 AM This report has been signed electronically.

## 2018-08-15 NOTE — Progress Notes (Signed)
Called to room to assist during endoscopic procedure.  Patient ID and intended procedure confirmed with present staff. Received instructions for my participation in the procedure from the performing physician.  

## 2018-08-15 NOTE — Patient Instructions (Signed)
Please read handouts provided. Continue present medications. Await pathology results.     YOU HAD AN ENDOSCOPIC PROCEDURE TODAY AT THE [facility]:   Refer to the procedure report that was given to you for any specific questions about what was found during the examination.  If the procedure report does not answer your questions, please call your gastroenterologist to clarify.  If you requested that your care partner not be given the details of your procedure findings, then the procedure report has been included in a sealed envelope for you to review at your convenience later.  YOU SHOULD EXPECT: Some feelings of bloating in the abdomen. Passage of more gas than usual.  Walking can help get rid of the air that was put into your GI tract during the procedure and reduce the bloating. If you had a lower endoscopy (such as a colonoscopy or flexible sigmoidoscopy) you may notice spotting of blood in your stool or on the toilet paper. If you underwent a bowel prep for your procedure, you may not have a normal bowel movement for a few days.  Please Note:  You might notice some irritation and congestion in your nose or some drainage.  This is from the oxygen used during your procedure.  There is no need for concern and it should clear up in a day or so.  SYMPTOMS TO REPORT IMMEDIATELY:   Following lower endoscopy (colonoscopy or flexible sigmoidoscopy):  Excessive amounts of blood in the stool  Significant tenderness or worsening of abdominal pains  Swelling of the abdomen that is new, acute  Fever of 100F or higher    For urgent or emergent issues, a gastroenterologist can be reached at any hour by calling [phone].   DIET:  We do recommend a small meal at first, but then you may proceed to your regular diet.  Drink plenty of fluids but you should avoid alcoholic beverages for 24 hours.  ACTIVITY:  You should plan to take it easy for the rest of today and you should NOT DRIVE  or use heavy machinery until tomorrow (because of the sedation medicines used during the test).    FOLLOW UP: Our staff will call the number listed on your records the next business day following your procedure to check on you and address any questions or concerns that you may have regarding the information given to you following your procedure. If we do not reach you, we will leave a message.  However, if you are feeling well and you are not experiencing any problems, there is no need to return our call.  We will assume that you have returned to your regular daily activities without incident.  If any biopsies were taken you will be contacted by phone or by letter within the next 1-3 weeks.  Please call us at [phone] if you have not heard about the biopsies in 3 weeks.    SIGNATURES/CONFIDENTIALITY: You and/or your care partner have signed paperwork which will be entered into your electronic medical record.  These signatures attest to the fact that that the information above on your After Visit Summary has been reviewed and is understood.  Full responsibility of the confidentiality of this discharge information lies with you and/or your care-partner. 

## 2018-08-16 ENCOUNTER — Telehealth: Payer: Self-pay

## 2018-08-16 NOTE — Telephone Encounter (Signed)
  Follow up Call-  Call back number 08/15/2018  Post procedure Call Back phone  # [phone]  Permission to leave phone message Yes  Some recent data might be hidden     Patient questions:  Do you have a fever, pain , or abdominal swelling? No. Pain Score  0 *  Have you tolerated food without any problems? Yes.    Have you been able to return to your normal activities? Yes.    Do you have any questions about your discharge instructions: Diet   No. Medications  No. Follow up visit  No.  Do you have questions or concerns about your Care? No.  Actions: * If pain score is 4 or above: No action needed, pain <4.

## 2018-08-19 NOTE — Progress Notes (Signed)
I reviewed health advisor's note, was available for consultation, and agree with documentation and plan.  

## 2018-08-20 ENCOUNTER — Encounter: Payer: Self-pay | Admitting: Internal Medicine

## 2018-08-20 NOTE — Progress Notes (Signed)
Diminutive adenoma No recall at age 73

## 2018-10-11 ENCOUNTER — Encounter: Payer: Self-pay | Admitting: Family Medicine

## 2018-12-20 ENCOUNTER — Encounter: Payer: Self-pay | Admitting: Family Medicine

## 2018-12-20 ENCOUNTER — Other Ambulatory Visit: Payer: Self-pay

## 2018-12-20 ENCOUNTER — Ambulatory Visit (INDEPENDENT_AMBULATORY_CARE_PROVIDER_SITE_OTHER): Payer: Medicare Other | Admitting: Family Medicine

## 2018-12-20 VITALS — BP 160/80 | HR 65 | Ht 70.0 in | Wt 188.0 lb

## 2018-12-20 DIAGNOSIS — E782 Mixed hyperlipidemia: Secondary | ICD-10-CM | POA: Diagnosis not present

## 2018-12-20 DIAGNOSIS — E119 Type 2 diabetes mellitus without complications: Secondary | ICD-10-CM | POA: Diagnosis not present

## 2018-12-20 DIAGNOSIS — R5383 Other fatigue: Secondary | ICD-10-CM | POA: Diagnosis not present

## 2018-12-20 DIAGNOSIS — E0590 Thyrotoxicosis, unspecified without thyrotoxic crisis or storm: Secondary | ICD-10-CM | POA: Diagnosis not present

## 2018-12-20 DIAGNOSIS — I10 Essential (primary) hypertension: Secondary | ICD-10-CM | POA: Diagnosis not present

## 2018-12-20 MED ORDER — ACCU-CHEK AVIVA DEVI: 0 refills | Status: DC

## 2018-12-20 MED ORDER — ACCU-CHEK AVIVA VI STRP: ORAL_STRIP | 3 refills | Status: DC

## 2018-12-20 MED ORDER — VITAMIN D3 1.25 MG (50000 UT) PO TABS: 1.0000 | ORAL_TABLET | ORAL | 0 refills | Status: DC

## 2018-12-20 NOTE — Progress Notes (Signed)
Virtual visit completed through [employer]. Due to national recommendations of social distancing due to COVID-19, a virtual visit is felt to be most appropriate for this patient at this time. Reviewed limitations of a virtual visit.   Patient location: home, wife also present in room  Provider location:  at [facility], office If any vitals were documented, they were collected by patient at home unless specified below.    BP (!) 178/90 (BP Location: Right Arm, Cuff Size: Large)   Pulse 65   Ht 5\' 10"  (1.778 m)   Wt 188 lb (85.3 kg)   BMI 26.98 kg/m    CC: 6 mo f/u visit Subjective:    Patient ID: Gregory Abbott, male    DOB: [birth year], 73 y.o.   MRN: [id number]  HPI: CARROL BONDAR is a 73 y.o. male presenting on 12/20/2018 for Follow-up (6 mo f/u. Wants to discuss vit D. )   Requests Rx for vitamin D sent to pharmacy. He feels better since taking 50,000 u weekly. Improved fatigue.  Scheduled tomorrow for nurse visit and labs.   HLD - has not been taking statin.   HTN - Compliant with current antihypertensive regimen of amlodipine 5mg  daily, atenolol 100mg  daily, hctz 25mg  daily. Has not taken BP meds yet this morning. Does not regularly check blood pressures at home. No low blood pressure readings or symptoms of dizziness/syncope.  Denies HA, vision changes, CP/tightness, SOB, leg swelling.   DM - does not regularly check sugars. Compliant with antihyperglycemic regimen which includes: diet controlled. Denies low sugars or hypoglycemic symptoms. Denies paresthesias. Last diabetic eye exam DUE. Pneumovax: 2016. Prevnar: 2015. Glucometer brand: does not have at home. DSME: declines. Lab Results  Component Value Date   HGBA1C 6.5 06/15/2018   Diabetic Foot Exam - Simple   No data filed     Lab Results  Component Value Date   MICROALBUR <0.7 06/15/2018         Relevant past medical, surgical, family and social history reviewed and updated as indicated. Interim  medical history since our last visit reviewed. Allergies and medications reviewed and updated. Outpatient Medications Prior to Visit  Medication Sig Dispense Refill  . albuterol (PROVENTIL HFA;VENTOLIN HFA) 108 (90 Base) MCG/ACT inhaler Inhale 2 puffs into the lungs every 6 (six) hours as needed for wheezing or shortness of breath. 1 Inhaler 3  . amLODipine (NORVASC) 5 MG tablet TAKE 1 TABLET BY MOUTH EVERY DAY 90 tablet 3  . aspirin EC 81 MG tablet Take 81 mg by mouth every other day.    [name] atenolol (TENORMIN) 100 MG tablet TAKE 1 TABLET (100 MG TOTAL) BY MOUTH DAILY AFTER BREAKFAST. 90 tablet 3  . DiphenhydrAMINE HCl (BENADRYL ALLERGY PO) Take 1 tablet by mouth daily as needed.    . hydrochlorothiazide (HYDRODIURIL) 25 MG tablet TAKE 1 TABLET BY MOUTH EVERY DAY 90 tablet 3  . loratadine (CLARITIN) 10 MG tablet Take 1 tablet (10 mg total) by mouth daily. (Patient taking differently: Take 10 mg by mouth daily. As needed)    . omeprazole (PRILOSEC) 40 MG capsule TAKE 1 CAPSULE BY MOUTH EVERY DAY 90 capsule 3  . sildenafil (VIAGRA) 100 MG tablet Take 0.5-1 tablets (50-100 mg total) by mouth daily as needed for erectile dysfunction. 5 tablet 11  . atorvastatin (LIPITOR) 20 MG tablet Take 1 tablet (20 mg total) by mouth daily. (Patient not taking: Reported on 08/01/2018) 90 tablet 3  . clobetasol cream (TEMOVATE) 0.05 % Apply  1 application topically 2 (two) times daily. Apply to AA for hands, no more than 2 wks at a time (Patient not taking: Reported on 08/01/2018) 60 g 0  . clotrimazole-betamethasone (LOTRISONE) cream Apply 1 application topically 2 (two) times daily as needed. (Patient not taking: Reported on 08/01/2018) 45 g 0   No facility-administered medications prior to visit.      Per HPI unless specifically indicated in ROS section below Review of Systems Objective:    BP (!) 178/90 (BP Location: Right Arm, Cuff Size: Large)   Pulse 65   Ht 5\' 10"  (1.778 m)   Wt 188 lb (85.3 kg)   BMI  26.98 kg/m   Wt Readings from Last 3 Encounters:  12/20/18 188 lb (85.3 kg)  08/15/18 187 lb (84.8 kg)  08/01/18 187 lb 9.6 oz (85.1 kg)     Physical exam: Gen: alert, NAD, not ill appearing Pulm: speaks in complete sentences without increased work of breathing Psych: normal mood, normal thought content      Results for orders placed or performed in visit on 06/18/18  POCT Urinalysis Dipstick (Automated)  Result Value Ref Range   Color, UA yellow    Clarity, UA clear    Glucose, UA Negative Negative   Bilirubin, UA negative    Ketones, UA negaive    Spec Grav, UA 1.020 1.010 - 1.025   Blood, UA negative    pH, UA 6.0 5.0 - 8.0   Protein, UA Negative Negative   Urobilinogen, UA 0.2 0.2 or 1.0 E.U./dL   Nitrite, UA negative    Leukocytes, UA Negative Negative   Assessment & Plan:   Problem List Items Addressed This Visit    Subclinical hyperthyroidism    Update TFTs.       Relevant Orders   TSH   T4, free   VITAMIN D 25 Hydroxy (Vit-D Deficiency, Fractures)   Mixed hyperlipidemia    Did not start statin. Requests retesting FLP tomorrow then decide.       Relevant Orders   Lipid panel   Hypertension    Deterioration noted today - however he did not take BP meds yet this morning. Will recheck tomorrow at nurse visit/lab visit.       Controlled type 2 diabetes mellitus without complication (HCC) - Primary    Chronic, stable. Overdue for eye exam. Declines DSME.       Relevant Orders   Basic metabolic panel   Hemoglobin A1c    Other Visit Diagnoses    Fatigue, unspecified type       Relevant Orders   VITAMIN D 25 Hydroxy (Vit-D Deficiency, Fractures)       Meds ordered this encounter  Medications  . glucose blood (ACCU-CHEK AVIVA) test strip    Sig: Use as instructed to check sugars once daily and as needed    Dispense:  100 each    Refill:  3    Please fill based on pt formulary/cost  . Blood Glucose Monitoring Suppl (ACCU-CHEK AVIVA) device     Sig: Use as instructed    Dispense:  1 each    Refill:  0    Please fill based on pt formulary/cost  . Cholecalciferol (VITAMIN D3) 1.25 MG (50000 UT) TABS    Sig: Take 1 tablet by mouth once a week.    Dispense:  12 tablet    Refill:  0   Orders Placed This Encounter  Procedures  . Basic metabolic panel  Standing Status:   Future    Standing Expiration Date:   12/20/2019  . Hemoglobin A1c    Standing Status:   Future    Standing Expiration Date:   12/20/2019  . Lipid panel    Standing Status:   Future    Standing Expiration Date:   12/20/2019  . TSH    Standing Status:   Future    Standing Expiration Date:   12/20/2019  . T4, free    Standing Status:   Future    Standing Expiration Date:   12/20/2019  . VITAMIN D 25 Hydroxy (Vit-D Deficiency, Fractures)    Standing Status:   Future    Standing Expiration Date:   12/20/2019    I discussed the assessment and treatment plan with the patient. The patient was provided an opportunity to ask questions and all were answered. The patient agreed with the plan and demonstrated an understanding of the instructions. The patient was advised to call back or seek an in-person evaluation if the symptoms worsen or if the condition fails to improve as anticipated.  Follow up plan: No follow-ups on file.  Ria Bush, MD

## 2018-12-20 NOTE — Assessment & Plan Note (Signed)
Update TFTs ° °

## 2018-12-20 NOTE — Assessment & Plan Note (Signed)
Chronic, stable. Overdue for eye exam. Declines DSME.

## 2018-12-20 NOTE — Assessment & Plan Note (Addendum)
Deterioration noted today - however he did not take BP meds yet this morning. Will recheck tomorrow at nurse visit/lab visit.

## 2018-12-20 NOTE — Assessment & Plan Note (Signed)
Did not start statin. Requests retesting FLP tomorrow then decide.

## 2018-12-21 ENCOUNTER — Other Ambulatory Visit (INDEPENDENT_AMBULATORY_CARE_PROVIDER_SITE_OTHER): Payer: Medicare Other

## 2018-12-21 DIAGNOSIS — R5383 Other fatigue: Secondary | ICD-10-CM | POA: Diagnosis not present

## 2018-12-21 DIAGNOSIS — E782 Mixed hyperlipidemia: Secondary | ICD-10-CM

## 2018-12-21 DIAGNOSIS — E0590 Thyrotoxicosis, unspecified without thyrotoxic crisis or storm: Secondary | ICD-10-CM

## 2018-12-21 DIAGNOSIS — E119 Type 2 diabetes mellitus without complications: Secondary | ICD-10-CM | POA: Diagnosis not present

## 2018-12-21 LAB — LIPID PANEL
Cholesterol: 225 mg/dL — ABNORMAL HIGH (ref 0–200)
HDL: 48.5 mg/dL (ref >39.00)
LDL Cholesterol: 149 mg/dL — ABNORMAL HIGH (ref 0–99)
NonHDL: 176.41
Total CHOL/HDL Ratio: 5
Triglycerides: 137 mg/dL (ref 0.0–149.0)
VLDL: 27.4 mg/dL (ref 0.0–40.0)

## 2018-12-21 LAB — T4, FREE: Free T4: 1.16 ng/dL (ref 0.60–1.60)

## 2018-12-21 LAB — BASIC METABOLIC PANEL
BUN: 12 mg/dL (ref 6–23)
CO2: 30 mEq/L (ref 19–32)
Calcium: 9.6 mg/dL (ref 8.4–10.5)
Chloride: 101 mEq/L (ref 96–112)
Creatinine, Ser: 1.07 mg/dL (ref 0.40–1.50)
GFR: 81.85 mL/min (ref >60.00)
Glucose, Bld: 122 mg/dL — ABNORMAL HIGH (ref 70–99)
Potassium: 3.5 mEq/L (ref 3.5–5.1)
Sodium: 140 mEq/L (ref 135–145)

## 2018-12-21 LAB — VITAMIN D 25 HYDROXY (VIT D DEFICIENCY, FRACTURES): VITD: 72.43 ng/mL (ref 30.00–100.00)

## 2018-12-21 LAB — HEMOGLOBIN A1C: Hgb A1c MFr Bld: 6.6 % — ABNORMAL HIGH (ref 4.6–6.5)

## 2018-12-21 LAB — TSH: TSH: 1.55 u[IU]/mL (ref 0.35–4.50)

## 2018-12-26 ENCOUNTER — Telehealth: Payer: Self-pay

## 2018-12-26 ENCOUNTER — Other Ambulatory Visit: Payer: Self-pay

## 2018-12-26 MED ORDER — ACCU-CHEK AVIVA DEVI: 0 refills | Status: DC

## 2018-12-26 MED ORDER — ACCU-CHEK AVIVA VI STRP: ORAL_STRIP | 3 refills | Status: DC

## 2018-12-26 NOTE — Telephone Encounter (Signed)
Received rx clarification from [facility] asking for new rx for Accu-Chek Aviva meter with dx code.  Added dx code E11.9 to new rx and e-scribed.

## 2018-12-26 NOTE — Telephone Encounter (Signed)
Pt said [facility] received a rx from Dr Darnell Level for [facility]. Pt said pharmacy request clarification of what Dr Darnell Level wanted pt to have. Pt request to resend rx to [facility]. I spoke with [place] at [facility] and [facility] need accu [facility]k test strips rx resent with Dx code. Done. Nothing further needed.

## 2019-03-08 ENCOUNTER — Other Ambulatory Visit: Payer: Self-pay | Admitting: Family Medicine

## 2019-04-03 DIAGNOSIS — Z23 Encounter for immunization: Secondary | ICD-10-CM | POA: Diagnosis not present

## 2019-05-29 ENCOUNTER — Other Ambulatory Visit: Payer: Self-pay

## 2019-06-16 ENCOUNTER — Other Ambulatory Visit: Payer: Self-pay | Admitting: Family Medicine

## 2019-06-16 DIAGNOSIS — E119 Type 2 diabetes mellitus without complications: Secondary | ICD-10-CM

## 2019-06-16 DIAGNOSIS — Z125 Encounter for screening for malignant neoplasm of prostate: Secondary | ICD-10-CM

## 2019-06-16 DIAGNOSIS — E782 Mixed hyperlipidemia: Secondary | ICD-10-CM

## 2019-06-16 DIAGNOSIS — E0590 Thyrotoxicosis, unspecified without thyrotoxic crisis or storm: Secondary | ICD-10-CM

## 2019-06-17 ENCOUNTER — Other Ambulatory Visit: Payer: Self-pay

## 2019-06-17 ENCOUNTER — Other Ambulatory Visit (INDEPENDENT_AMBULATORY_CARE_PROVIDER_SITE_OTHER): Payer: Medicare Other

## 2019-06-17 DIAGNOSIS — E119 Type 2 diabetes mellitus without complications: Secondary | ICD-10-CM | POA: Diagnosis not present

## 2019-06-17 DIAGNOSIS — Z125 Encounter for screening for malignant neoplasm of prostate: Secondary | ICD-10-CM

## 2019-06-17 DIAGNOSIS — E0590 Thyrotoxicosis, unspecified without thyrotoxic crisis or storm: Secondary | ICD-10-CM | POA: Diagnosis not present

## 2019-06-17 DIAGNOSIS — E782 Mixed hyperlipidemia: Secondary | ICD-10-CM

## 2019-06-17 LAB — COMPREHENSIVE METABOLIC PANEL
ALT: 13 U/L (ref 0–53)
AST: 13 U/L (ref 0–37)
Albumin: 4 g/dL (ref 3.5–5.2)
Alkaline Phosphatase: 53 U/L (ref 39–117)
BUN: 14 mg/dL (ref 6–23)
CO2: 30 mEq/L (ref 19–32)
Calcium: 9.6 mg/dL (ref 8.4–10.5)
Chloride: 99 mEq/L (ref 96–112)
Creatinine, Ser: 1.09 mg/dL (ref 0.40–1.50)
GFR: 80.02 mL/min (ref >60.00)
Glucose, Bld: 114 mg/dL — ABNORMAL HIGH (ref 70–99)
Potassium: 4 mEq/L (ref 3.5–5.1)
Sodium: 138 mEq/L (ref 135–145)
Total Bilirubin: 0.7 mg/dL (ref 0.2–1.2)
Total Protein: 7.2 g/dL (ref 6.0–8.3)

## 2019-06-17 LAB — TSH: TSH: 1.62 u[IU]/mL (ref 0.35–4.50)

## 2019-06-17 LAB — LIPID PANEL
Cholesterol: 212 mg/dL — ABNORMAL HIGH (ref 0–200)
HDL: 44.6 mg/dL (ref >39.00)
LDL Cholesterol: 133 mg/dL — ABNORMAL HIGH (ref 0–99)
NonHDL: 167.13
Total CHOL/HDL Ratio: 5
Triglycerides: 169 mg/dL — ABNORMAL HIGH (ref 0.0–149.0)
VLDL: 33.8 mg/dL (ref 0.0–40.0)

## 2019-06-17 LAB — T4, FREE: Free T4: 1.13 ng/dL (ref 0.60–1.60)

## 2019-06-17 LAB — MICROALBUMIN / CREATININE URINE RATIO
Creatinine,U: 170.3 mg/dL
Microalb Creat Ratio: 0.5 mg/g (ref 0.0–30.0)
Microalb, Ur: 0.8 mg/dL (ref 0.0–1.9)

## 2019-06-17 LAB — PSA, MEDICARE: PSA: 1.67 ng/ml (ref 0.10–4.00)

## 2019-06-17 LAB — HEMOGLOBIN A1C: Hgb A1c MFr Bld: 6.5 % (ref 4.6–6.5)

## 2019-06-20 ENCOUNTER — Ambulatory Visit (INDEPENDENT_AMBULATORY_CARE_PROVIDER_SITE_OTHER): Payer: Medicare Other

## 2019-06-20 ENCOUNTER — Other Ambulatory Visit: Payer: Self-pay

## 2019-06-20 DIAGNOSIS — Z0000 Encounter for general adult medical examination without abnormal findings: Secondary | ICD-10-CM | POA: Diagnosis not present

## 2019-06-20 NOTE — Patient Instructions (Signed)
[name] , Thank you for taking time to come for your Medicare Wellness Visit. I appreciate your ongoing commitment to your health goals. Please review the following plan we discussed and let me know if I can assist you in the future.   Screening recommendations/referrals: Colonoscopy: Up to date, completed 08/15/2018 Recommended yearly ophthalmology/optometry visit for glaucoma screening and checkup Recommended yearly dental visit for hygiene and checkup  Vaccinations: Influenza vaccine: Up to date, completed 04/03/2019  Pneumococcal vaccine: Completed series Tdap vaccine: Up to date, completed 01/31/2011 Shingles vaccine: discussed    Advanced directives: Please bring a copy of your POA (Power of [place]) and/or Living Will to your next appointment.   Conditions/risks identified: diabetes, hypertension, hyperlipidemia  Next appointment: 06/26/2019 @ 4 pm   Preventive Care 74 Years and Older, Male Preventive care refers to lifestyle choices and visits with your health care provider that can promote health and wellness. What does preventive care include?  A yearly physical exam. This is also called an annual well check.  Dental exams once or twice a year.  Routine eye exams. Ask your health care provider how often you should have your eyes checked.  Personal lifestyle choices, including:  Daily care of your teeth and gums.  Regular physical activity.  Eating a healthy diet.  Avoiding tobacco and drug use.  Limiting alcohol use.  Practicing safe sex.  Taking low doses of aspirin every day.  Taking vitamin and mineral supplements as recommended by your health care provider. What happens during an annual well check? The services and screenings done by your health care provider during your annual well check will depend on your age, overall health, lifestyle risk factors, and family history of disease. Counseling  Your health care provider may ask you questions about  your:  Alcohol use.  Tobacco use.  Drug use.  Emotional well-being.  Home and relationship well-being.  Sexual activity.  Eating habits.  History of falls.  Memory and ability to understand (cognition).  Work and work Statistician. Screening  You may have the following tests or measurements:  Height, weight, and BMI.  Blood pressure.  Lipid and cholesterol levels. These may be checked every 5 years, or more frequently if you are over 10 years old.  Skin check.  Lung cancer screening. You may have this screening every year starting at age 74 if you have a 30-pack-year history of smoking and currently smoke or have quit within the past 15 years.  Fecal occult blood test (FOBT) of the stool. You may have this test every year starting at age 74.  Flexible sigmoidoscopy or colonoscopy. You may have a sigmoidoscopy every 5 years or a colonoscopy every 10 years starting at age 74.  Prostate cancer screening. Recommendations will vary depending on your family history and other risks.  Hepatitis C blood test.  Hepatitis B blood test.  Sexually transmitted disease (STD) testing.  Diabetes screening. This is done by checking your blood sugar (glucose) after you have not eaten for a while (fasting). You may have this done every 1-3 years.  Abdominal aortic aneurysm (AAA) screening. You may need this if you are a current or former smoker.  Osteoporosis. You may be screened starting at age 74 if you are at high risk. Talk with your health care provider about your test results, treatment options, and if necessary, the need for more tests. Vaccines  Your health care provider may recommend certain vaccines, such as:  Influenza vaccine. This is recommended every  year.  Tetanus, diphtheria, and acellular pertussis (Tdap, Td) vaccine. You may need a Td booster every 10 years.  Zoster vaccine. You may need this after age 74.  Pneumococcal 13-valent conjugate (PCV13) vaccine.  One dose is recommended after age 74.  Pneumococcal polysaccharide (PPSV23) vaccine. One dose is recommended after age 74. Talk to your health care provider about which screenings and vaccines you need and how often you need them. This information is not intended to replace advice given to you by your health care provider. Make sure you discuss any questions you have with your health care provider. Document Released: 07/17/2015 Document Revised: 03/09/2016 Document Reviewed: 04/21/2015 Elsevier Interactive Patient Education  2017 [place] Prevention in the Home Falls can cause injuries. They can happen to people of all ages. There are many things you can do to make your home safe and to help prevent falls. What can I do on the outside of my home?  Regularly fix the edges of walkways and driveways and fix any cracks.  Remove anything that might make you trip as you walk through a door, such as a raised step or threshold.  Trim any bushes or trees on the path to your home.  Use bright outdoor lighting.  Clear any walking paths of anything that might make someone trip, such as rocks or tools.  Regularly check to see if handrails are loose or broken. Make sure that both sides of any steps have handrails.  Any raised decks and porches should have guardrails on the edges.  Have any leaves, snow, or ice cleared regularly.  Use sand or salt on walking paths during winter.  Clean up any spills in your garage right away. This includes oil or grease spills. What can I do in the bathroom?  Use night lights.  Install grab bars by the toilet and in the tub and shower. Do not use towel bars as grab bars.  Use non-skid mats or decals in the tub or shower.  If you need to sit down in the shower, use a plastic, non-slip stool.  Keep the floor dry. Clean up any water that spills on the floor as soon as it happens.  Remove soap buildup in the tub or shower regularly.  Attach bath  mats securely with double-sided non-slip rug tape.  Do not have throw rugs and other things on the floor that can make you trip. What can I do in the bedroom?  Use night lights.  Make sure that you have a light by your bed that is easy to reach.  Do not use any sheets or blankets that are too big for your bed. They should not hang down onto the floor.  Have a firm chair that has side arms. You can use this for support while you get dressed.  Do not have throw rugs and other things on the floor that can make you trip. What can I do in the kitchen?  Clean up any spills right away.  Avoid walking on wet floors.  Keep items that you use a lot in easy-to-reach places.  If you need to reach something above you, use a strong step stool that has a grab bar.  Keep electrical cords out of the way.  Do not use floor polish or wax that makes floors slippery. If you must use wax, use non-skid floor wax.  Do not have throw rugs and other things on the floor that can make you trip. What can  I do with my stairs?  Do not leave any items on the stairs.  Make sure that there are handrails on both sides of the stairs and use them. Fix handrails that are broken or loose. Make sure that handrails are as long as the stairways.  Check any carpeting to make sure that it is firmly attached to the stairs. Fix any carpet that is loose or worn.  Avoid having throw rugs at the top or bottom of the stairs. If you do have throw rugs, attach them to the floor with carpet tape.  Make sure that you have a light switch at the top of the stairs and the bottom of the stairs. If you do not have them, ask someone to add them for you. What else can I do to help prevent falls?  Wear shoes that:  Do not have high heels.  Have rubber bottoms.  Are comfortable and fit you well.  Are closed at the toe. Do not wear sandals.  If you use a stepladder:  Make sure that it is fully opened. Do not climb a closed  stepladder.  Make sure that both sides of the stepladder are locked into place.  Ask someone to hold it for you, if possible.  Clearly mark and make sure that you can see:  Any grab bars or handrails.  First and last steps.  Where the edge of each step is.  Use tools that help you move around (mobility aids) if they are needed. These include:  Canes.  Walkers.  Scooters.  Crutches.  Turn on the lights when you go into a dark area. Replace any light bulbs as soon as they burn out.  Set up your furniture so you have a clear path. Avoid moving your furniture around.  If any of your floors are uneven, fix them.  If there are any pets around you, be aware of where they are.  Review your medicines with your doctor. Some medicines can make you feel dizzy. This can increase your chance of falling. Ask your doctor what other things that you can do to help prevent falls. This information is not intended to replace advice given to you by your health care provider. Make sure you discuss any questions you have with your health care provider. Document Released: 04/16/2009 Document Revised: 11/26/2015 Document Reviewed: 07/25/2014 Elsevier Interactive Patient Education  2017 [employer].

## 2019-06-20 NOTE — Progress Notes (Signed)
PCP notes:  Health Maintenance: Needs foot exam    Abnormal Screenings: none   Patient concerns: none   Nurse concerns: none   Next PCP appt.: 06/26/2019 @ 4 pm

## 2019-06-20 NOTE — Progress Notes (Signed)
Subjective:   Gregory Abbott is a 73 y.o. male who presents for Medicare Annual/Subsequent preventive examination.  Review of Systems: N/A   This visit is being conducted through telemedicine via telephone at the nurse health advisor's home address due to the COVID-19 pandemic. This patient has given me verbal consent via doximity to conduct this visit, patient states they are participating from their home address. Patient and myself are on the telephone call. There is no referral for this visit. Some vital signs may be absent or patient reported.    Patient identification: identified by name, DOB, and current address   Cardiac Risk Factors include: advanced age (>15men, >69 women);diabetes mellitus;hypertension;dyslipidemia;male gender     Objective:    Vitals: There were no vitals taken for this visit.  There is no height or weight on file to calculate BMI.  Advanced Directives 06/20/2019 06/15/2018 05/02/2016 11/07/2013  Does Patient Have a Medical Advance Directive? Yes Yes Yes Patient does not have advance directive  Type of Advance Directive [place];Living will [place];Living will [place];Living will -  Does patient want to make changes to medical advance directive? - - No - Patient declined -  Copy of [place] in Chart? No - copy requested No - copy requested No - copy requested -  Pre-existing out of facility DNR order (yellow form or pink MOST form) - - - No    Tobacco Social History   Tobacco Use  Smoking Status Former Smoker  . Packs/day: 0.25  . Types: Cigarettes  . Quit date: 11/06/2013  . Years since quitting: 5.6  Smokeless Tobacco Never Used     Counseling given: Not Answered   Clinical Intake:  Pre-visit preparation completed: Yes  Pain : No/denies pain     Nutritional Risks: None Diabetes: Yes CBG done?: No Did pt. bring in CBG monitor from home?: No  How often do  you need to have someone help you when you read instructions, pamphlets, or other written materials from your doctor or pharmacy?: 1 - Never What is the last grade level you completed in school?: Associates Degree  Interpreter Needed?: No  Information entered by :: CJohnson, LPN  Past Medical History:  Diagnosis Date  . Blood transfusion without reported diagnosis   . BPH (benign prostatic hypertrophy)   . Cataract   . Childhood asthma   . COPD (chronic obstructive pulmonary disease) ([place])   . Eczema   . Ex-smoker    40 PY hx  . GERD (gastroesophageal reflux disease)   . History of chicken pox   . Hypertension   . Lower back pain    h/o herniated and bulging discs lower back  . Osteoarthritis of neck   . Personal history of colonic polyps 05/07/2013   resected not retrieved  . PUD (peptic ulcer disease)   . S/P splenectomy    partial  . Shortness of breath    Past Surgical History:  Procedure Laterality Date  . AAA screen  04/2013   neg for AAA  . CATARACT EXTRACTION Left 05/2013   [facility]  . CATARACT EXTRACTION W/PHACO Right 10/06/2015   Procedure: CATARACT EXTRACTION PHACO AND INTRAOCULAR LENS PLACEMENT (IOC);  Surgeon: Birder Robson, MD;  Location: [facility] ORS;  Service: Ophthalmology;  Laterality: Right;  [place] AP% CDE fluid pack lot 3 [id number] H exp 04/02/2017  . COLONOSCOPY  2009  . COLONOSCOPY  05/2013   1 polyp, int/ext hemorrhoids Carlean Purl)  .  COLONOSCOPY  08/2018   Diminutive adenoma, no f/u planned Carlean Purl)  . KNEE ARTHROSCOPY    . lower extremity arterial doppler  2013   WNL  . SPLENECTOMY, PARTIAL  1998   fall down stairs   Family History  Problem Relation Age of Onset  . Diabetes Mother   . Hypertension Mother   . Hyperlipidemia Mother   . Heart disease Mother   . Stroke Father   . Colon cancer Neg Hx   . Rectal cancer Neg Hx   . Stomach cancer Neg Hx   . Esophageal cancer Neg Hx    Social History   Socioeconomic History  . Marital  status: Married    Spouse name: Not on file  . Number of children: 2  . Years of education: Not on file  . Highest education level: Not on file  Occupational History  . Occupation: Retired  Tobacco Use  . Smoking status: Former Smoker    Packs/day: 0.25    Types: Cigarettes    Quit date: 11/06/2013    Years since quitting: 5.6  . Smokeless tobacco: Never Used  Substance and Sexual Activity  . Alcohol use: Yes    Alcohol/week: 2.0 standard drinks    Types: 1 Cans of beer, 1 Shots of liquor per week    Comment: occasional  . Drug use: No  . Sexual activity: Yes  Other Topics Concern  . Not on file  Social History Narrative   Lives with wife, no pets   Occupation: retired, Engineer, mining   Edu: Degree   Activity: housework, no regular exercise   Diet: good water, fruits/vegetables daily   Social Determinants of Health   Financial Resource Strain: Low Risk   . Difficulty of Paying Living Expenses: Not hard at all  Food Insecurity: No Food Insecurity  . Worried About Charity fundraiser in the Last Year: Never true  . Ran Out of Food in the Last Year: Never true  Transportation Needs: No Transportation Needs  . Lack of Transportation (Medical): No  . Lack of Transportation (Non-Medical): No  Physical Activity: Inactive  . Days of Exercise per Week: 0 days  . Minutes of Exercise per Session: 0 min  Stress: No Stress Concern Present  . Feeling of Stress : Not at all  Social Connections:   . Frequency of Communication with Friends and Family: Not on file  . Frequency of Social Gatherings with Friends and Family: Not on file  . Attends Religious Services: Not on file  . Active Member of Clubs or Organizations: Not on file  . Attends Archivist Meetings: Not on file  . Marital Status: Not on file    Outpatient Encounter Medications as of 06/20/2019  Medication Sig  . albuterol (PROVENTIL HFA;VENTOLIN HFA) 108 (90 Base) MCG/ACT inhaler Inhale 2 puffs into the lungs every  6 (six) hours as needed for wheezing or shortness of breath.  [name] amLODipine (NORVASC) 5 MG tablet TAKE 1 TABLET BY MOUTH EVERY DAY  . aspirin EC 81 MG tablet Take 81 mg by mouth every other day.  [name] atenolol (TENORMIN) 100 MG tablet TAKE 1 TABLET (100 MG TOTAL) BY MOUTH DAILY AFTER BREAKFAST.  [name] atorvastatin (LIPITOR) 20 MG tablet Take 1 tablet (20 mg total) by mouth daily.  . Blood Glucose Monitoring Suppl (ACCU-CHEK AVIVA) device Use as instructed to check blood sugar once daily. Dx code  E11.9  . Cholecalciferol (VITAMIN D3) 1.25 MG (50000 UT) CAPS TAKE 1 CAPSULE  BY MOUTH ONE TIME PER WEEK  . clobetasol cream (TEMOVATE) [id number] % Apply 1 application topically 2 (two) times daily. Apply to AA for hands, no more than 2 wks at a time  . clotrimazole-betamethasone (LOTRISONE) cream Apply 1 application topically 2 (two) times daily as needed.  . DiphenhydrAMINE HCl (BENADRYL ALLERGY PO) Take 1 tablet by mouth daily as needed.  [name] glucose blood (ACCU-CHEK AVIVA) test strip Use as instructed to check sugars once daily and as needed Dx E11.9.  [name] hydrochlorothiazide (HYDRODIURIL) 25 MG tablet TAKE 1 TABLET BY MOUTH EVERY DAY  . loratadine (CLARITIN) 10 MG tablet Take 1 tablet (10 mg total) by mouth daily. (Patient taking differently: Take 10 mg by mouth daily. As needed)  . omeprazole (PRILOSEC) 40 MG capsule TAKE 1 CAPSULE BY MOUTH EVERY DAY  . sildenafil (VIAGRA) 100 MG tablet Take 0.5-1 tablets (50-100 mg total) by mouth daily as needed for erectile dysfunction.   No facility-administered encounter medications on file as of 06/20/2019.    Activities of Daily Living In your present state of health, do you have any difficulty performing the following activities: 06/20/2019  Hearing? N  Vision? N  Difficulty concentrating or making decisions? N  Walking or climbing stairs? N  Dressing or bathing? N  Doing errands, shopping? N  Preparing Food and eating ? N  Using the Toilet? N  In the past six  months, have you accidently leaked urine? N  Do you have problems with loss of bowel control? N  Managing your Medications? N  Managing your Finances? N  Housekeeping or managing your Housekeeping? N  Some recent data might be hidden    Patient Care Team: Ria Bush, MD as PCP - General (Family Medicine) Birder Robson, MD as Referring Physician (Ophthalmology)   Assessment:   This is a routine wellness examination for [place].  Exercise Activities and Dietary recommendations Current Exercise Habits: Home exercise routine, Type of exercise: walking, Time (Minutes): 30, Frequency (Times/Week): 3, Weekly Exercise (Minutes/Week): 90, Intensity: Mild, Exercise limited by: None identified  Goals    . Increase physical activity     Starting 06/15/2018, I will continue to walk at least 30 min 2 days per week.     . Patient Stated     06/20/2019, I will maintain and continue medications as prescribed.        Fall Risk Fall Risk  06/20/2019 06/15/2018 06/08/2017 05/02/2016 04/03/2015  Falls in the past year? 0 0 No No No  Number falls in past yr: 0 - - - -  Injury with Fall? 0 - - - -  Risk for fall due to : Medication side effect - - - -  Follow up Falls prevention discussed;Falls evaluation completed - - - -   Is the patient's home free of loose throw rugs in walkways, pet beds, electrical cords, etc?   yes      Grab bars in the bathroom? no      Handrails on the stairs?   yes      Adequate lighting?   yes  Timed Get Up and Go Performed: N/A  Depression Screen PHQ 2/9 Scores 06/20/2019 06/15/2018 06/08/2017 05/02/2016  PHQ - 2 Score 0 0 0 0  PHQ- 9 Score 0 0 - -    Cognitive Function MMSE - Mini Mental State Exam 06/20/2019 06/15/2018 05/02/2016  Orientation to time 5 5 5   Orientation to Place 5 5 5   Registration 3 3 3   Attention/ Calculation 5  0 0  Recall 3 3 3   Language- name 2 objects - 0 0  Language- repeat 1 1 1   Language- follow 3 step command - 3 3    Language- read & follow direction - 0 0  Write a sentence - 0 0  Copy design - 0 0  Total score - 20 20  Mini Cog  Mini-Cog screen was completed. Maximum score is 22. A value of 0 denotes this part of the MMSE was not completed or the patient failed this part of the Mini-Cog screening.       Immunization History  Administered Date(s) Administered  . Influenza, High Dose Seasonal PF 04/05/2017, 04/03/2019  . Influenza,inj,Quad PF,6+ Mos 03/30/2015, 05/02/2016, 04/20/2018  . Influenza-Unspecified 04/03/2014, 05/04/2014  . Pneumococcal Conjugate-13 11/14/2013  . Pneumococcal Polysaccharide-23 04/03/2015  . Tdap 01/31/2011    Qualifies for Shingles Vaccine? Yes  Screening Tests Health Maintenance  Topic Date Due  . FOOT EXAM  08/09/2017  . HEMOGLOBIN A1C  12/16/2019  . OPHTHALMOLOGY EXAM  03/04/2020  . URINE MICROALBUMIN  06/16/2020  . DTaP/Tdap/Td (2 - Td) 01/30/2021  . TETANUS/TDAP  01/30/2021  . COLONOSCOPY  08/16/2023  . INFLUENZA VACCINE  Completed  . Hepatitis C Screening  Completed  . PNA vac Low Risk Adult  Completed   Cancer Screenings: Lung: Low Dose CT Chest recommended if Age 35-80 years, 30 pack-year currently smoking OR have quit w/in 15years. Patient does not qualify. Colorectal: completed 08/15/2018  Additional Screenings:  Hepatitis C Screening: 05/02/2016      Plan:   Patient will maintain and continue medications as prescribed.   I have personally reviewed and noted the following in the patient's chart:   . Medical and social history . Use of alcohol, tobacco or illicit drugs  . Current medications and supplements . Functional ability and status . Nutritional status . Physical activity . Advanced directives . List of other physicians . Hospitalizations, surgeries, and ER visits in previous 12 months . Vitals . Screenings to include cognitive, depression, and falls . Referrals and appointments  In addition, I have reviewed and discussed  with patient certain preventive protocols, quality metrics, and best practice recommendations. A written personalized care plan for preventive services as well as general preventive health recommendations were provided to patient.     Andrez Grime, LPN  [phone]

## 2019-06-22 ENCOUNTER — Other Ambulatory Visit: Payer: Self-pay | Admitting: Family Medicine

## 2019-06-22 NOTE — Telephone Encounter (Signed)
Is pt due for labs before refill?

## 2019-06-24 ENCOUNTER — Encounter: Payer: Medicare Other | Admitting: Family Medicine

## 2019-06-24 ENCOUNTER — Ambulatory Visit: Payer: Medicare Other

## 2019-06-26 ENCOUNTER — Ambulatory Visit (INDEPENDENT_AMBULATORY_CARE_PROVIDER_SITE_OTHER): Payer: Medicare Other | Admitting: Family Medicine

## 2019-06-26 ENCOUNTER — Ambulatory Visit (INDEPENDENT_AMBULATORY_CARE_PROVIDER_SITE_OTHER)
Admission: RE | Admit: 2019-06-26 | Discharge: 2019-06-26 | Disposition: A | Discharge status: Home / Self Care | Payer: Medicare Other | Source: Ambulatory Visit | Attending: Family Medicine | Admitting: Family Medicine

## 2019-06-26 ENCOUNTER — Other Ambulatory Visit: Payer: Self-pay

## 2019-06-26 ENCOUNTER — Encounter: Payer: Self-pay | Admitting: Family Medicine

## 2019-06-26 VITALS — BP 124/60 | HR 83 | Temp 98.0°F | Ht 69.0 in | Wt 185.1 lb

## 2019-06-26 DIAGNOSIS — E1169 Type 2 diabetes mellitus with other specified complication: Secondary | ICD-10-CM

## 2019-06-26 DIAGNOSIS — E118 Type 2 diabetes mellitus with unspecified complications: Secondary | ICD-10-CM

## 2019-06-26 DIAGNOSIS — J449 Chronic obstructive pulmonary disease, unspecified: Secondary | ICD-10-CM

## 2019-06-26 DIAGNOSIS — R0989 Other specified symptoms and signs involving the circulatory and respiratory systems: Secondary | ICD-10-CM | POA: Diagnosis not present

## 2019-06-26 DIAGNOSIS — K219 Gastro-esophageal reflux disease without esophagitis: Secondary | ICD-10-CM

## 2019-06-26 DIAGNOSIS — N529 Male erectile dysfunction, unspecified: Secondary | ICD-10-CM

## 2019-06-26 DIAGNOSIS — I10 Essential (primary) hypertension: Secondary | ICD-10-CM

## 2019-06-26 DIAGNOSIS — E785 Hyperlipidemia, unspecified: Secondary | ICD-10-CM

## 2019-06-26 DIAGNOSIS — E0590 Thyrotoxicosis, unspecified without thyrotoxic crisis or storm: Secondary | ICD-10-CM

## 2019-06-26 MED ORDER — VITAMIN D3 25 MCG (1000 UT) PO CAPS: 1.0000 | ORAL_CAPSULE | Freq: Every day | ORAL | Status: AC

## 2019-06-26 MED ORDER — SILDENAFIL CITRATE 100 MG PO TABS: 50.0000 mg | ORAL_TABLET | Freq: Every day | ORAL | 11 refills | Status: DC | PRN

## 2019-06-26 MED ORDER — ROSUVASTATIN CALCIUM 5 MG PO TABS: 5.0000 mg | ORAL_TABLET | ORAL | 3 refills | Status: DC

## 2019-06-26 NOTE — Patient Instructions (Addendum)
If interested, check with pharmacy about new 2 shot shingles series (shingrix). Let me know if you need a prescription.  Try crestor 5mg  MWF in place of atorvastatin.  Xray today You do have diet controlled diabetes - continue limiting added sugars, sweetened beverages, and simple carbs in the diet.  You are doing well today Return as needed or in 6 months for diabetes follow up visit   Health Maintenance After Age 74 After age 35, you are at a higher risk for certain long-term diseases and infections as well as injuries from falls. Falls are a major cause of broken bones and head injuries in people who are older than age 79. Getting regular preventive care can help to keep you healthy and well. Preventive care includes getting regular testing and making lifestyle changes as recommended by your health care provider. Talk with your health care provider about:  Which screenings and tests you should have. A screening is a test that checks for a disease when you have no symptoms.  A diet and exercise plan that is right for you. What should I know about screenings and tests to prevent falls? Screening and testing are the best ways to find a health problem early. Early diagnosis and treatment give you the best chance of managing medical conditions that are common after age 10. Certain conditions and lifestyle choices may make you more likely to have a fall. Your health care provider may recommend:  Regular vision checks. Poor vision and conditions such as cataracts can make you more likely to have a fall. If you wear glasses, make sure to get your prescription updated if your vision changes.  Medicine review. Work with your health care provider to regularly review all of the medicines you are taking, including over-the-counter medicines. Ask your health care provider about any side effects that may make you more likely to have a fall. Tell your health care provider if any medicines that you take make you  feel dizzy or sleepy.  Osteoporosis screening. Osteoporosis is a condition that causes the bones to get weaker. This can make the bones weak and cause them to break more easily.  Blood pressure screening. Blood pressure changes and medicines to control blood pressure can make you feel dizzy.  Strength and balance checks. Your health care provider may recommend certain tests to check your strength and balance while standing, walking, or changing positions.  Foot health exam. Foot pain and numbness, as well as not wearing proper footwear, can make you more likely to have a fall.  Depression screening. You may be more likely to have a fall if you have a fear of falling, feel emotionally low, or feel unable to do activities that you used to do.  Alcohol use screening. Using too much alcohol can affect your balance and may make you more likely to have a fall. What actions can I take to lower my risk of falls? General instructions  Talk with your health care provider about your risks for falling. Tell your health care provider if: ? You fall. Be sure to tell your health care provider about all falls, even ones that seem minor. ? You feel dizzy, sleepy, or off-balance.  Take over-the-counter and prescription medicines only as told by your health care provider. These include any supplements.  Eat a healthy diet and maintain a healthy weight. A healthy diet includes low-fat dairy products, low-fat (lean) meats, and fiber from whole grains, beans, and lots of fruits and vegetables. Home  safety  Remove any tripping hazards, such as rugs, cords, and clutter.  Install safety equipment such as grab bars in bathrooms and safety rails on stairs.  Keep rooms and walkways well-lit. Activity   Follow a regular exercise program to stay fit. This will help you maintain your balance. Ask your health care provider what types of exercise are appropriate for you.  If you need a cane or walker, use it as  recommended by your health care provider.  Wear supportive shoes that have nonskid soles. Lifestyle  Do not drink alcohol if your health care provider tells you not to drink.  If you drink alcohol, limit how much you have: ? 0-1 drink a day for women. ? 0-2 drinks a day for men.  Be aware of how much alcohol is in your drink. In the [place] one drink equals one typical bottle of beer (12 oz), one-half glass of wine (5 oz), or one shot of hard liquor (1 oz).  Do not use any products that contain nicotine or tobacco, such as cigarettes and e-cigarettes. If you need help quitting, ask your health care provider. Summary  Having a healthy lifestyle and getting preventive care can help to protect your health and wellness after age 81.  Screening and testing are the best way to find a health problem early and help you avoid having a fall. Early diagnosis and treatment give you the best chance for managing medical conditions that are more common for people who are older than age 16.  Falls are a major cause of broken bones and head injuries in people who are older than age 6. Take precautions to prevent a fall at home.  Work with your health care provider to learn what changes you can make to improve your health and wellness and to prevent falls. This information is not intended to replace advice given to you by your health care provider. Make sure you discuss any questions you have with your health care provider. Document Released: 05/03/2017 Document Revised: 10/11/2018 Document Reviewed: 05/03/2017 Elsevier Patient Education  2020 [employer].

## 2019-06-26 NOTE — Assessment & Plan Note (Signed)
Chronic, stable, diet controlled. Reviewed dx with patient. Foot exam today. UTD eye exam.

## 2019-06-26 NOTE — Assessment & Plan Note (Signed)
Ex smoker. Stable period off respiratory medication

## 2019-06-26 NOTE — Assessment & Plan Note (Addendum)
Coarse crackles to RLL. In strong smoking history, update CXR. Denies cough, dyspnea, wheeze. No noted weight loss.

## 2019-06-26 NOTE — Progress Notes (Signed)
This visit was conducted in person.  BP 124/60 (BP Location: Left Arm, Patient Position: Sitting, Cuff Size: Large)   Pulse 83   Temp 98 F (36.7 C) (Temporal)   Ht 5\' 9"  (1.753 m)   Wt 185 lb 1 oz (83.9 kg)   SpO2 95%   BMI 27.33 kg/m    CC: AMW f/u visit Subjective:    Patient ID: Gregory Abbott, male    DOB: Nov 09, 1945, 73 y.o.   MRN: [id number]  HPI: Gregory Abbott is a 73 y.o. male presenting on 06/26/2019 for Annual Exam (Prt 2. )   Saw health advisor last week for medicare wellness visit. Note reviewed.    No exam data present    Clinical Support from 06/20/2019 in [place] at [facility] Total Score  0      Fall Risk  06/20/2019 06/15/2018 06/08/2017 05/02/2016 04/03/2015  Falls in the past year? 0 0 No No No  Number falls in past yr: 0 - - - -  Injury with Fall? 0 - - - -  Risk for fall due to : Medication side effect - - - -  Follow up Falls prevention discussed;Falls evaluation completed - - - -      Preventative: COLONOSCOPY 08/2018 - Diminutive adenoma, no f/u planned Carlean Purl) Prostate cancer screening - discussed,normal DRE 2017, monitoring with PSA Lung cancer screening - ~25 PY hx - not eligible. Quit ~2014  Flu shot - yearly Tdap - 2012 Prevnar 11/2013, pneumovax2016- s/p splenectomy will need Q5 yrs  Shingrix - discussed  Advanced directives -daughtertobe HCPOA- new advanced directive packet previously provided.Asked to bring [place] copy for chart.  Seat belt use discussed.  Sunscreen use discussed. No changing moles on skin.  Ex smoker - quit ~2015. Prior 2 ppwk.  Alcohol - social (backed off due to itchy rash).  Dentist - seen early 2019 in [place], encouraged he schedule appt Eye exam - yearly Bowel - no constipation Bladder - no incontinence  Lives with wife, no pets Occupation: retired, Engineer, mining Edu: Degree Activity: housework, no regular exercise  Diet: good water, fruits/vegetables daily      Relevant past medical,  surgical, family and social history reviewed and updated as indicated. Interim medical history since our last visit reviewed. Allergies and medications reviewed and updated. Outpatient Medications Prior to Visit  Medication Sig Dispense Refill  . albuterol (PROVENTIL HFA;VENTOLIN HFA) 108 (90 Base) MCG/ACT inhaler Inhale 2 puffs into the lungs every 6 (six) hours as needed for wheezing or shortness of breath. 1 Inhaler 3  . amLODipine (NORVASC) 5 MG tablet TAKE 1 TABLET BY MOUTH EVERY DAY 90 tablet 3  . aspirin EC 81 MG tablet Take 81 mg by mouth every other day.    [name] atenolol (TENORMIN) 100 MG tablet TAKE 1 TABLET (100 MG TOTAL) BY MOUTH DAILY AFTER BREAKFAST. 90 tablet 3  . Blood Glucose Monitoring Suppl (ACCU-CHEK AVIVA) device Use as instructed to check blood sugar once daily. Dx code  E11.9 1 each 0  . Cholecalciferol (VITAMIN D3) 25 MCG (1000 UT) CAPS Take 1 capsule (1,000 Units total) by mouth daily. 30 capsule   . clobetasol cream (TEMOVATE) [id number] % Apply 1 application topically 2 (two) times daily. Apply to AA for hands, no more than 2 wks at a time 60 g 0  . clotrimazole-betamethasone (LOTRISONE) cream Apply 1 application topically 2 (two) times daily as needed. 45 g 0  . DiphenhydrAMINE HCl (BENADRYL ALLERGY PO)  Take 1 tablet by mouth daily as needed.    [name] glucose blood (ACCU-CHEK AVIVA) test strip Use as instructed to check sugars once daily and as needed Dx E11.9. 100 each 3  . hydrochlorothiazide (HYDRODIURIL) 25 MG tablet TAKE 1 TABLET BY MOUTH EVERY DAY 90 tablet 3  . loratadine (CLARITIN) 10 MG tablet Take 1 tablet (10 mg total) by mouth daily. (Patient taking differently: Take 10 mg by mouth daily. As needed)    . omeprazole (PRILOSEC) 40 MG capsule TAKE 1 CAPSULE BY MOUTH EVERY DAY 90 capsule 3  . sildenafil (VIAGRA) 100 MG tablet Take 0.5-1 tablets (50-100 mg total) by mouth daily as needed for erectile dysfunction. 5 tablet 11  . atorvastatin (LIPITOR) 20 MG tablet Take 1 tablet  (20 mg total) by mouth daily. (Patient not taking: Reported on 06/26/2019) 90 tablet 3   No facility-administered medications prior to visit.     Per HPI unless specifically indicated in ROS section below Review of Systems Objective:    BP 124/60 (BP Location: Left Arm, Patient Position: Sitting, Cuff Size: Large)   Pulse 83   Temp 98 F (36.7 C) (Temporal)   Ht 5\' 9"  (1.753 m)   Wt 185 lb 1 oz (83.9 kg)   SpO2 95%   BMI 27.33 kg/m   Wt Readings from Last 3 Encounters:  06/26/19 185 lb 1 oz (83.9 kg)  12/20/18 188 lb (85.3 kg)  08/15/18 187 lb (84.8 kg)    Physical Exam Vitals and nursing note reviewed.  Constitutional:      General: He is not in acute distress.    Appearance: Normal appearance. He is well-developed. He is not ill-appearing.  HENT:     Head: Normocephalic and atraumatic.     Right Ear: Hearing, tympanic membrane, ear canal and external ear normal.     Left Ear: Hearing, tympanic membrane, ear canal and external ear normal.     Nose: Nose normal.     Mouth/Throat:     Pharynx: Uvula midline.  Eyes:     General: No scleral icterus.    Extraocular Movements: Extraocular movements intact.     Conjunctiva/sclera: Conjunctivae normal.     Pupils: Pupils are equal, round, and reactive to light.  Neck:     Vascular: No carotid bruit.  Cardiovascular:     Rate and Rhythm: Normal rate and regular rhythm.     Pulses: Normal pulses.          Radial pulses are 2+ on the right side and 2+ on the left side.     Heart sounds: Normal heart sounds. No murmur.  Pulmonary:     Effort: Pulmonary effort is normal. No respiratory distress.     Breath sounds: No wheezing, rhonchi or rales.     Comments: RLL coarse crackles  Abdominal:     General: Abdomen is flat. Bowel sounds are normal. There is no distension.     Palpations: Abdomen is soft. There is no mass.     Tenderness: There is no abdominal tenderness. There is no guarding or rebound.     Hernia: No hernia is  present.  Musculoskeletal:        General: Normal range of motion.     Cervical back: Normal range of motion and neck supple.     Right lower leg: No edema.     Left lower leg: No edema.  Lymphadenopathy:     Cervical: No cervical adenopathy.  Skin:  General: Skin is warm and dry.     Findings: No rash.  Neurological:     General: No focal deficit present.     Mental Status: He is alert and oriented to person, place, and time.     Comments: CN grossly intact, station and gait intact  Psychiatric:        Mood and Affect: Mood normal.        Behavior: Behavior normal.        Thought Content: Thought content normal.        Judgment: Judgment normal.    Diabetic Foot Exam - Simple   Simple Foot Form Diabetic Foot exam was performed with the following findings: Yes 06/26/2019  4:43 PM  Visual Inspection No deformities, no ulcerations, no other skin breakdown bilaterally: Yes Sensation Testing Intact to touch and monofilament testing bilaterally: Yes Pulse Check Posterior Tibialis and Dorsalis pulse intact bilaterally: Yes Comments        Results for orders placed or performed in visit on 06/17/19  T4, Free  Result Value Ref Range   Free T4 1.13 0.60 - 1.60 ng/dL  TSH  Result Value Ref Range   TSH 1.62 0.35 - 4.50 uIU/mL  Hemoglobin A1c  Result Value Ref Range   Hgb A1c MFr Bld 6.5 4.6 - 6.5 %  PSA, Medicare  Result Value Ref Range   PSA 1.67 0.10 - 4.00 ng/ml  Microalbumin / creatinine urine ratio  Result Value Ref Range   Microalb, Ur 0.8 0.0 - 1.9 mg/dL   Creatinine,U 170.3 mg/dL   Microalb Creat Ratio 0.5 0.0 - 30.0 mg/g  Comprehensive metabolic panel  Result Value Ref Range   Sodium 138 135 - 145 mEq/L   Potassium 4.0 3.5 - 5.1 mEq/L   Chloride 99 96 - 112 mEq/L   CO2 30 19 - 32 mEq/L   Glucose, Bld 114 (H) 70 - 99 mg/dL   BUN 14 6 - 23 mg/dL   Creatinine, Ser 1.09 0.40 - 1.50 mg/dL   Total Bilirubin 0.7 0.2 - 1.2 mg/dL   Alkaline Phosphatase 53 39 -  117 U/L   AST 13 0 - 37 U/L   ALT 13 0 - 53 U/L   Total Protein 7.2 6.0 - 8.3 g/dL   Albumin 4.0 3.5 - 5.2 g/dL   GFR 80.02 >60.00 mL/min   Calcium 9.6 8.4 - 10.5 mg/dL  Lipid panel  Result Value Ref Range   Cholesterol 212 (H) 0 - 200 mg/dL   Triglycerides 169.0 (H) 0.0 - 149.0 mg/dL   HDL 44.60 >39.00 mg/dL   VLDL 33.8 0.0 - 40.0 mg/dL   LDL Cholesterol 133 (H) 0 - 99 mg/dL   Total CHOL/HDL Ratio 5    NonHDL 167.13    Assessment & Plan:  This visit occurred during the SARS-CoV-2 public health emergency.  Safety protocols were in place, including screening questions prior to the visit, additional usage of staff PPE, and extensive cleaning of exam room while observing appropriate contact time as indicated for disinfecting solutions.   Problem List Items Addressed This Visit    Subclinical hyperthyroidism    Chronic, stable off thyroid replacement.       Hypertension    Chronic, stable. Continue current regimen (hydrochlorothiazide, atenolol). H/o itchy rash to ARB.      Relevant Medications   sildenafil (VIAGRA) 100 MG tablet   rosuvastatin (CRESTOR) 5 MG tablet   Hyperlipidemia associated with type 2 diabetes mellitus ([place])  Did not start statin. Reviewed benefits of statin in diabetes. rec trial crestor 5mg  MWF. Update if trouble tolerating.  The 10-year ASCVD risk score Mikey Bussing DC Brooke Bonito., et al., 2013) is: 36.7%   Values used to calculate the score:     Age: 84 years     Sex: Male     Is Non-Hispanic African American: Yes     Diabetic: Yes     Tobacco smoker: No     Systolic Blood Pressure: A999333 mmHg     Is BP treated: Yes     HDL Cholesterol: 44.6 mg/dL     Total Cholesterol: 212 mg/dL       Relevant Medications   rosuvastatin (CRESTOR) 5 MG tablet   GERD (gastroesophageal reflux disease)    Continues daily PPN.       Erectile dysfunction of organic origin    viagra refilled.       COPD (chronic obstructive pulmonary disease) ([place])    Ex smoker. Stable period  off respiratory medication      Controlled diabetes mellitus type 2 with complications (HCC) - Primary    Chronic, stable, diet controlled. Reviewed dx with patient. Foot exam today. UTD eye exam.       Relevant Medications   rosuvastatin (CRESTOR) 5 MG tablet   Abnormal lung sounds    Coarse crackles to RLL. In strong smoking history, update CXR. Denies cough, dyspnea, wheeze. No noted weight loss.       Relevant Orders   DG Chest 2 View       Meds ordered this encounter  Medications  . sildenafil (VIAGRA) 100 MG tablet    Sig: Take 0.5-1 tablets (50-100 mg total) by mouth daily as needed for erectile dysfunction.    Dispense:  5 tablet    Refill:  11  . rosuvastatin (CRESTOR) 5 MG tablet    Sig: Take 1 tablet (5 mg total) by mouth every Monday, Wednesday, and Friday.    Dispense:  40 tablet    Refill:  3   Orders Placed This Encounter  Procedures  . DG Chest 2 View    Standing Status:   Future    Number of Occurrences:   1    Standing Expiration Date:   08/26/2020    Order Specific Question:   Reason for Exam (SYMPTOM  OR DIAGNOSIS REQUIRED)    Answer:   abnormal breath sounds RLL coarse crackles    Order Specific Question:   Preferred imaging location?    Answer:   [facility]    Order Specific Question:   Radiology Contrast Protocol - do NOT remove file path    Answer:   [email or web address]    Patient instructions: If interested, check with pharmacy about new 2 shot shingles series (shingrix). Let me know if you need a prescription.  Try crestor 5mg  MWF in place of atorvastatin.  Xray today You do have diet controlled diabetes - continue limiting added sugars, sweetened beverages, and simple carbs in the diet.  You are doing well today Return as needed or in 6 months for diabetes follow up visit   Follow up plan: Return in about 6 months (around 12/25/2019) for follow up visit.  Ria Bush, MD

## 2019-06-26 NOTE — Assessment & Plan Note (Signed)
Did not start statin. Reviewed benefits of statin in diabetes. rec trial crestor 5mg  MWF. Update if trouble tolerating.  The 10-year ASCVD risk score Gregory Abbott Gregory Abbott Gregory Abbott., et al., 2013) is: 36.7%   Values used to calculate the score:     Age: 73 years     Sex: Male     Is Non-Hispanic African American: Yes     Diabetic: Yes     Tobacco smoker: No     Systolic Blood Pressure: A999333 mmHg     Is BP treated: Yes     HDL Cholesterol: 44.6 mg/dL     Total Cholesterol: 212 mg/dL

## 2019-06-26 NOTE — Telephone Encounter (Signed)
Pt notified as instructed by phone.  Verbalizes understanding.  

## 2019-06-26 NOTE — Assessment & Plan Note (Signed)
viagra refilled.

## 2019-06-26 NOTE — Assessment & Plan Note (Signed)
Chronic, stable. Continue current regimen (hydrochlorothiazide, atenolol). H/o itchy rash to ARB.

## 2019-06-26 NOTE — Assessment & Plan Note (Signed)
Continues daily PPN.

## 2019-06-26 NOTE — Telephone Encounter (Signed)
Latest levels normal. Recommend he transition to 1000 units daily in place of Rx replacement. May pick up 1000 units OTC.

## 2019-06-26 NOTE — Assessment & Plan Note (Signed)
Chronic, stable off thyroid replacement.

## 2019-07-17 ENCOUNTER — Other Ambulatory Visit: Payer: Self-pay | Admitting: Family Medicine

## 2019-08-16 ENCOUNTER — Ambulatory Visit: Payer: Medicare Other | Attending: Internal Medicine

## 2019-08-16 DIAGNOSIS — Z23 Encounter for immunization: Secondary | ICD-10-CM | POA: Insufficient documentation

## 2019-08-16 NOTE — Progress Notes (Signed)
   Covid-19 Vaccination Clinic  Name:  Gregory Abbott    MRN: [id number] DOB: Mar 01, 1946  08/16/2019  [name] was observed post Covid-19 immunization for 15 minutes without incidence. He was provided with Vaccine Information Sheet and instruction to access the V-Safe system.   [name] was instructed to call 911 with any severe reactions post vaccine: [name] Difficulty breathing  . Swelling of your face and throat  . A fast heartbeat  . A bad rash all over your body  . Dizziness and weakness    Immunizations Administered    Name Date Dose VIS Date Route   Moderna COVID-19 Vaccine 08/16/2019 11:56 AM 0.5 mL 06/04/2019 Intramuscular   Manufacturer: Moderna   Lot: [id number]   [id number]

## 2019-09-16 ENCOUNTER — Ambulatory Visit: Payer: Medicare Other | Attending: Internal Medicine

## 2019-09-16 DIAGNOSIS — Z23 Encounter for immunization: Secondary | ICD-10-CM

## 2019-09-16 NOTE — Progress Notes (Signed)
   Covid-19 Vaccination Clinic  Name:  Gregory Abbott    MRN: [id number] DOB: 1946/04/10  09/16/2019  [name] was observed post Covid-19 immunization for 15 minutes without incident. He was provided with Vaccine Information Sheet and instruction to access the V-Safe system.   [name] was instructed to call 911 with any severe reactions post vaccine: [name] Difficulty breathing  . Swelling of face and throat  . A fast heartbeat  . A bad rash all over body  . Dizziness and weakness   Immunizations Administered    Name Date Dose VIS Date Route   Moderna COVID-19 Vaccine 09/16/2019 12:03 PM 0.5 mL 06/04/2019 Intramuscular   Manufacturer: Moderna   Lot: [id number]   [id number]

## 2019-09-28 ENCOUNTER — Other Ambulatory Visit: Payer: Self-pay | Admitting: Family Medicine

## 2019-12-21 ENCOUNTER — Other Ambulatory Visit: Payer: Self-pay | Admitting: Family Medicine

## 2019-12-25 ENCOUNTER — Ambulatory Visit (INDEPENDENT_AMBULATORY_CARE_PROVIDER_SITE_OTHER): Payer: Medicare Other | Admitting: Family Medicine

## 2019-12-25 ENCOUNTER — Other Ambulatory Visit: Payer: Self-pay

## 2019-12-25 ENCOUNTER — Other Ambulatory Visit: Payer: Self-pay | Admitting: Family Medicine

## 2019-12-25 ENCOUNTER — Encounter: Payer: Self-pay | Admitting: Family Medicine

## 2019-12-25 VITALS — BP 136/82 | HR 65 | Temp 97.9°F | Ht 69.0 in | Wt 186.1 lb

## 2019-12-25 DIAGNOSIS — E118 Type 2 diabetes mellitus with unspecified complications: Secondary | ICD-10-CM | POA: Diagnosis not present

## 2019-12-25 DIAGNOSIS — E785 Hyperlipidemia, unspecified: Secondary | ICD-10-CM

## 2019-12-25 DIAGNOSIS — E1169 Type 2 diabetes mellitus with other specified complication: Secondary | ICD-10-CM

## 2019-12-25 DIAGNOSIS — J449 Chronic obstructive pulmonary disease, unspecified: Secondary | ICD-10-CM | POA: Diagnosis not present

## 2019-12-25 LAB — LIPID PANEL
Cholesterol: 194 mg/dL (ref 0–200)
HDL: 48.5 mg/dL (ref >39.00)
LDL Cholesterol: 119 mg/dL — ABNORMAL HIGH (ref 0–99)
NonHDL: 145.64
Total CHOL/HDL Ratio: 4
Triglycerides: 135 mg/dL (ref 0.0–149.0)
VLDL: 27 mg/dL (ref 0.0–40.0)

## 2019-12-25 LAB — POCT GLYCOSYLATED HEMOGLOBIN (HGB A1C): Hemoglobin A1C: 6.3 % — AB (ref 4.0–5.6)

## 2019-12-25 MED ORDER — ALBUTEROL SULFATE HFA 108 (90 BASE) MCG/ACT IN AERS: 2.0000 | INHALATION_SPRAY | Freq: Four times a day (QID) | RESPIRATORY_TRACT | 3 refills | Status: DC | PRN

## 2019-12-25 NOTE — Assessment & Plan Note (Signed)
Actually latest check in prediabetes range. Congratulated on improvement noted. Reassess at 6 mo f/u visit.

## 2019-12-25 NOTE — Assessment & Plan Note (Addendum)
Recent CXR with hyperinflation and chronic bronchitis changes. Managed with PRN albuterol.

## 2019-12-25 NOTE — Assessment & Plan Note (Addendum)
Taking crestor about twice a week. Update FLP. For body aches associated with crestor, I suggested he try CoQ10 supplement, update with effect. The 10-year ASCVD risk score Mikey Bussing DC Brooke Bonito., et al., 2013) is: 42%   Values used to calculate the score:     Age: 74 years     Sex: Male     Is Non-Hispanic African American: Yes     Diabetic: Yes     Tobacco smoker: No     Systolic Blood Pressure: 567 mmHg     Is BP treated: Yes     HDL Cholesterol: 44.6 mg/dL     Total Cholesterol: 212 mg/dL

## 2019-12-25 NOTE — Progress Notes (Signed)
This visit was conducted in person.  BP 136/82 (BP Location: Left Arm, Patient Position: Sitting, Cuff Size: Normal)   Pulse 65   Temp 97.9 F (36.6 C) (Temporal)   Ht 5\' 9"  (1.753 m)   Wt 186 lb 2 oz (84.4 kg)   SpO2 96%   BMI 27.49 kg/m    CC: DM f/u visit  Subjective:    Patient ID: Gregory Abbott, male    DOB: Dec 06, 1945, 74 y.o.   MRN: [id number]  HPI: Gregory Abbott is a 74 y.o. male presenting on 12/25/2019 for Diabetes (Here for 6 mo f/u.)   Had cream with coffee this morning. Only takes omeprazole PRN, notes worse with spicy foods.   Last visit we started crestor 5mg  MWF due to elevated cholesterol levels in diabetic. When he takes crestor notes he wakes up with body aches. Takes about twice a week.   DM - does not regularly check sugars. Compliant with antihyperglycemic regimen which includes: diet control - not really limiting diet. Denies low sugars or hypoglycemic symptoms. Denies paresthesias. Last diabetic eye exam 03/2019. Pneumovax: 2016. Prevnar: 2015. Glucometer brand: doesn't have at home. DSME: discussed, declines.  Lab Results  Component Value Date   HGBA1C 6.3 (A) 12/25/2019   Diabetic Foot Exam - Simple   Simple Foot Form Diabetic Foot exam was performed with the following findings: Yes 12/25/2019  9:46 AM  Visual Inspection No deformities, no ulcerations, no other skin breakdown bilaterally: Yes Sensation Testing Intact to touch and monofilament testing bilaterally: Yes Pulse Check Posterior Tibialis and Dorsalis pulse intact bilaterally: Yes Comments    Lab Results  Component Value Date   MICROALBUR 0.8 06/17/2019         Relevant past medical, surgical, family and social history reviewed and updated as indicated. Interim medical history since our last visit reviewed. Allergies and medications reviewed and updated. Outpatient Medications Prior to Visit  Medication Sig Dispense Refill  . amLODipine (NORVASC) 5 MG tablet TAKE 1 TABLET BY  MOUTH EVERY DAY 90 tablet 0  . aspirin EC 81 MG tablet Take 81 mg by mouth every other day.    [name] atenolol (TENORMIN) 100 MG tablet TAKE 1 TABLET (100 MG TOTAL) BY MOUTH DAILY AFTER BREAKFAST. 90 tablet 3  . Cholecalciferol (VITAMIN D3) 25 MCG (1000 UT) CAPS Take 1 capsule (1,000 Units total) by mouth daily. 30 capsule   . hydrochlorothiazide (HYDRODIURIL) 25 MG tablet TAKE 1 TABLET BY MOUTH EVERY DAY 90 tablet 0  . loratadine (CLARITIN) 10 MG tablet Take 1 tablet (10 mg total) by mouth daily. (Patient taking differently: Take 10 mg by mouth daily. As needed)    . omeprazole (PRILOSEC) 40 MG capsule TAKE 1 CAPSULE BY MOUTH EVERY DAY (Patient taking differently: As needed) 90 capsule 0  . rosuvastatin (CRESTOR) 5 MG tablet Take 1 tablet (5 mg total) by mouth every Monday, Wednesday, and Friday. 40 tablet 3  . sildenafil (VIAGRA) 100 MG tablet Take 0.5-1 tablets (50-100 mg total) by mouth daily as needed for erectile dysfunction. 5 tablet 11  . albuterol (PROVENTIL HFA;VENTOLIN HFA) 108 (90 Base) MCG/ACT inhaler Inhale 2 puffs into the lungs every 6 (six) hours as needed for wheezing or shortness of breath. 1 Inhaler 3  . Blood Glucose Monitoring Suppl (ACCU-CHEK AVIVA) device Use as instructed to check blood sugar once daily. Dx code  E11.9 1 each 0  . clobetasol cream (TEMOVATE) 6.30 % Apply 1 application topically 2 (two) times daily.  Apply to AA for hands, no more than 2 wks at a time 60 g 0  . clotrimazole-betamethasone (LOTRISONE) cream Apply 1 application topically 2 (two) times daily as needed. 45 g 0  . DiphenhydrAMINE HCl (BENADRYL ALLERGY PO) Take 1 tablet by mouth daily as needed.    [name] glucose blood (ACCU-CHEK AVIVA) test strip Use as instructed to check sugars once daily and as needed Dx E11.9. 100 each 3   No facility-administered medications prior to visit.     Per HPI unless specifically indicated in ROS section below Review of Systems Objective:  BP 136/82 (BP Location: Left Arm,  Patient Position: Sitting, Cuff Size: Normal)   Pulse 65   Temp 97.9 F (36.6 C) (Temporal)   Ht 5\' 9"  (1.753 m)   Wt 186 lb 2 oz (84.4 kg)   SpO2 96%   BMI 27.49 kg/m   Wt Readings from Last 3 Encounters:  12/25/19 186 lb 2 oz (84.4 kg)  06/26/19 185 lb 1 oz (83.9 kg)  12/20/18 188 lb (85.3 kg)      Physical Exam Vitals and nursing note reviewed.  Constitutional:      General: He is not in acute distress.    Appearance: He is well-developed.  HENT:     Head: Normocephalic and atraumatic.     Right Ear: External ear normal.     Left Ear: External ear normal.  Eyes:     General: No scleral icterus.    Extraocular Movements: Extraocular movements intact.     Conjunctiva/sclera: Conjunctivae normal.     Pupils: Pupils are equal, round, and reactive to light.  Cardiovascular:     Rate and Rhythm: Normal rate and regular rhythm.     Heart sounds: Normal heart sounds. No murmur heard.   Pulmonary:     Effort: Pulmonary effort is normal. No respiratory distress.     Breath sounds: No wheezing, rhonchi or rales.     Comments: Coarse crackles bibasilarly Musculoskeletal:     Cervical back: Normal range of motion and neck supple.     Right lower leg: No edema.     Left lower leg: No edema.     Comments:  See HPI for foot exam if done 2+ DP bilaterally  Lymphadenopathy:     Cervical: No cervical adenopathy.  Skin:    General: Skin is warm and dry.     Findings: No rash.  Psychiatric:        Mood and Affect: Mood normal.        Behavior: Behavior normal.       Results for orders placed or performed in visit on 12/25/19  POCT glycosylated hemoglobin (Hb A1C)  Result Value Ref Range   Hemoglobin A1C 6.3 (A) 4.0 - 5.6 %   HbA1c POC (<> result, manual entry)     HbA1c, POC (prediabetic range)     HbA1c, POC (controlled diabetic range)     DG Chest 2 View CLINICAL DATA:  Abnormal breath sounds in the right lower lobe. Coarse crackles. Ex-smoker.  EXAM: CHEST - 2  VIEW  COMPARISON:  11/06/2013  FINDINGS: Normal sized heart. Clear lungs. Bilateral nipple shadows. The lungs are mildly hyperexpanded with mild peribronchial thickening and accentuation of the interstitial markings. Thoracic spine degenerative changes.  IMPRESSION: 1. No acute abnormality. 2. Mild changes of COPD and chronic bronchitis.  Electronically Signed   By: Claudie Revering M.D.   On: 06/27/2019 08:57   Assessment & Plan:  This  visit occurred during the SARS-CoV-2 public health emergency.  Safety protocols were in place, including screening questions prior to the visit, additional usage of staff PPE, and extensive cleaning of exam room while observing appropriate contact time as indicated for disinfecting solutions.   Problem List Items Addressed This Visit    Hyperlipidemia associated with type 2 diabetes mellitus ([place])    Taking crestor about twice a week. Update FLP.  The 10-year ASCVD risk score Mikey Bussing DC Brooke Bonito., et al., 2013) is: 42%   Values used to calculate the score:     Age: 37 years     Sex: Male     Is Non-Hispanic African American: Yes     Diabetic: Yes     Tobacco smoker: No     Systolic Blood Pressure: 340 mmHg     Is BP treated: Yes     HDL Cholesterol: 44.6 mg/dL     Total Cholesterol: 212 mg/dL       Relevant Orders   Lipid panel   COPD (chronic obstructive pulmonary disease) ([place])    Recent CXR with hyperinflation and chronic bronchitis changes. Managed with PRN albuterol.       Relevant Medications   albuterol (VENTOLIN HFA) 108 (90 Base) MCG/ACT inhaler   Controlled diabetes mellitus type 2 with complications ([place]) - Primary    Actually latest check in prediabetes range. Congratulated on improvement noted. Reassess at 6 mo f/u visit.       Relevant Orders   POCT glycosylated hemoglobin (Hb A1C) (Completed)       Meds ordered this encounter  Medications  . albuterol (VENTOLIN HFA) 108 (90 Base) MCG/ACT inhaler    Sig: Inhale 2 puffs into  the lungs every 6 (six) hours as needed for wheezing or shortness of breath.    Dispense:  18 g    Refill:  3   Orders Placed This Encounter  Procedures  . Lipid panel  . POCT glycosylated hemoglobin (Hb A1C)    Patient Instructions  Continue current medicines.  Labs today  For body aches due to crestor, trial muscle supplement CoEnzyme Q10 daily.  You are doing well today Sugars were in prediabetes range now - continue limiting added sugars and carbs in the diet.  Return as needed or in 6 months for wellness visit.     Follow up plan: Return for medicare wellness visit, follow up visit.  Ria Bush, MD

## 2019-12-25 NOTE — Telephone Encounter (Signed)
Message from pharmacy stating Xopenex is not covered by ins co.  Suggesting albuterol inhaler as alternative.  Is this ok?  Spoke with pt asking if he was notified.  Confirms he was and agrees to alternative suggested if Dr. Darnell Level agrees.

## 2019-12-25 NOTE — Patient Instructions (Addendum)
Continue current medicines.  Labs today  For body aches due to crestor, trial muscle supplement CoEnzyme Q10 daily.  You are doing well today Sugars were in prediabetes range now - continue limiting added sugars and carbs in the diet.  Return as needed or in 6 months for wellness visit.

## 2019-12-26 NOTE — Telephone Encounter (Signed)
Ok to try albuterol - sent in

## 2020-01-20 ENCOUNTER — Ambulatory Visit (INDEPENDENT_AMBULATORY_CARE_PROVIDER_SITE_OTHER)
Admission: RE | Admit: 2020-01-20 | Discharge: 2020-01-20 | Disposition: A | Discharge status: Home / Self Care | Payer: Medicare Other | Source: Ambulatory Visit | Attending: Family Medicine | Admitting: Family Medicine

## 2020-01-20 ENCOUNTER — Ambulatory Visit (INDEPENDENT_AMBULATORY_CARE_PROVIDER_SITE_OTHER): Payer: Medicare Other | Admitting: Family Medicine

## 2020-01-20 ENCOUNTER — Encounter: Payer: Self-pay | Admitting: Family Medicine

## 2020-01-20 ENCOUNTER — Other Ambulatory Visit: Payer: Self-pay

## 2020-01-20 DIAGNOSIS — M7989 Other specified soft tissue disorders: Secondary | ICD-10-CM

## 2020-01-20 DIAGNOSIS — M11231 Other chondrocalcinosis, right wrist: Secondary | ICD-10-CM | POA: Diagnosis not present

## 2020-01-20 DIAGNOSIS — M25531 Pain in right wrist: Secondary | ICD-10-CM

## 2020-01-20 DIAGNOSIS — M11241 Other chondrocalcinosis, right hand: Secondary | ICD-10-CM | POA: Diagnosis not present

## 2020-01-20 DIAGNOSIS — M19031 Primary osteoarthritis, right wrist: Secondary | ICD-10-CM | POA: Diagnosis not present

## 2020-01-20 DIAGNOSIS — M79641 Pain in right hand: Secondary | ICD-10-CM

## 2020-01-20 DIAGNOSIS — M19041 Primary osteoarthritis, right hand: Secondary | ICD-10-CM | POA: Diagnosis not present

## 2020-01-20 NOTE — Patient Instructions (Addendum)
Labs and xray for hand/wrist pain /swelling Continue to rest it  Continue ice Wrap the wrist /thumb -if it helps to support   Tylenol is ok   You can also get voltaren gel 1%  (diclofenac) over the counter  You can use it up to 4 times daily to painful area  If symptoms worsen please let us know

## 2020-01-20 NOTE — Assessment & Plan Note (Signed)
See a/p for swelling of hand

## 2020-01-20 NOTE — Progress Notes (Signed)
Subjective:    Patient ID: Gregory Abbott, male    DOB: 03/25/1946, 74 y.o.   MRN: [id number]  This visit occurred during the SARS-CoV-2 public health emergency.  Safety protocols were in place, including screening questions prior to the visit, additional usage of staff PPE, and extensive cleaning of exam room while observing appropriate contact time as indicated for disinfecting solutions.    HPI 74 yo pt of Dr Darnell Level presents with swelling in R hand   Wt Readings from Last 3 Encounters:  01/20/20 186 lb 3 oz (84.5 kg)  12/25/19 186 lb 2 oz (84.4 kg)  06/26/19 185 lb 1 oz (83.9 kg)   27.50 kg/m  On Wednesday - he changed a blade on lawnmower- was strenuous work (had to use impact gun to get a nut off) -strained hands  Thursday had a sore hand  Over weekend started to swell and continued pain  Throbbed at night   No h/o arthritis or gout   Wife thought his hand seemed warm  ? If a little red on palm after he rubs it    Pain in lateral wrist- worse when he abducts thumb Used ice packs off and on all day yesterday -that helped some  Today is improved   Still hard to grip or open things   He is R handed  No numbness/tingling   Generally feels ok   He used tylenol and some cbd oil and it helped    Patient Active Problem List   Diagnosis Date Noted  . Right hand pain 01/20/2020  . Swelling of right hand 01/20/2020  . Right wrist pain 01/20/2020  . Abnormal lung sounds 06/26/2019  . Allergic rhinitis, seasonal 04/20/2018  . Erectile dysfunction of organic origin 06/08/2017  . Subclinical hyperthyroidism 05/10/2016  . Hyperlipidemia associated with type 2 diabetes mellitus ([place]) 05/01/2016  . GERD (gastroesophageal reflux disease) 01/27/2016  . Advanced care planning/counseling discussion 04/03/2015  . Controlled diabetes mellitus type 2 with complications ([place]) 72/03/4708  . COPD (chronic obstructive pulmonary disease) ([place]) 12/10/2013  . Skin rash 09/09/2013  .  Medicare annual wellness visit, subsequent 02/01/2013  . Ex-smoker 10/22/2012  . Hypertension   . Lower back pain    Past Medical History:  Diagnosis Date  . Blood transfusion without reported diagnosis   . BPH (benign prostatic hypertrophy)   . Cataract   . Childhood asthma   . COPD (chronic obstructive pulmonary disease) ([place])   . Eczema   . Ex-smoker    40 PY hx  . GERD (gastroesophageal reflux disease)   . History of chicken pox   . Hypertension   . Lower back pain    h/o herniated and bulging discs lower back  . Osteoarthritis of neck   . Personal history of colonic polyps 05/07/2013   resected not retrieved  . PUD (peptic ulcer disease)   . S/P splenectomy    partial  . Shortness of breath    Past Surgical History:  Procedure Laterality Date  . AAA screen  04/2013   neg for AAA  . CATARACT EXTRACTION Left 05/2013   [facility]  . CATARACT EXTRACTION W/PHACO Right 10/06/2015   Procedure: CATARACT EXTRACTION PHACO AND INTRAOCULAR LENS PLACEMENT (IOC);  Surgeon: Birder Robson, MD;  Location: [facility] ORS;  Service: Ophthalmology;  Laterality: Right;  [place] AP% CDE fluid pack lot [id number] H exp 04/02/2017  . COLONOSCOPY  2009  . COLONOSCOPY  05/2013   1 polyp, int/ext hemorrhoids Carlean Purl)  .  COLONOSCOPY  08/2018   Diminutive adenoma, no f/u planned Carlean Purl)  . KNEE ARTHROSCOPY    . lower extremity arterial doppler  2013   WNL  . SPLENECTOMY, PARTIAL  1998   fall down stairs   Social History   Tobacco Use  . Smoking status: Former Smoker    Packs/day: 0.25    Types: Cigarettes    Quit date: 11/06/2013    Years since quitting: 6.2  . Smokeless tobacco: Never Used  Vaping Use  . Vaping Use: Never used  Substance Use Topics  . Alcohol use: Yes    Alcohol/week: 2.0 standard drinks    Types: 1 Cans of beer, 1 Shots of liquor per week    Comment: occasional  . Drug use: No   Family History  Problem Relation Age of Onset  . Diabetes Mother   . Hypertension  Mother   . Hyperlipidemia Mother   . Heart disease Mother   . Stroke Father   . Colon cancer Neg Hx   . Rectal cancer Neg Hx   . Stomach cancer Neg Hx   . Esophageal cancer Neg Hx    Allergies  Allergen Reactions  . Penicillins Other (See Comments)    Unknown - as child. ?asthma  . Azithromycin Rash  . Losartan Rash    Itchy diffuse rash throughout body   Current Outpatient Medications on File Prior to Visit  Medication Sig Dispense Refill  . albuterol (VENTOLIN HFA) 108 (90 Base) MCG/ACT inhaler Inhale 2 puffs into the lungs every 6 (six) hours as needed for wheezing or shortness of breath. 18 g 3  . amLODipine (NORVASC) 5 MG tablet TAKE 1 TABLET BY MOUTH EVERY DAY 90 tablet 0  . aspirin EC 81 MG tablet Take 81 mg by mouth every other day.    [name] atenolol (TENORMIN) 100 MG tablet TAKE 1 TABLET (100 MG TOTAL) BY MOUTH DAILY AFTER BREAKFAST. 90 tablet 3  . Cholecalciferol (VITAMIN D3) 25 MCG (1000 UT) CAPS Take 1 capsule (1,000 Units total) by mouth daily. 30 capsule   . hydrochlorothiazide (HYDRODIURIL) 25 MG tablet TAKE 1 TABLET BY MOUTH EVERY DAY 90 tablet 0  . loratadine (CLARITIN) 10 MG tablet Take 1 tablet (10 mg total) by mouth daily. (Patient taking differently: Take 10 mg by mouth daily. As needed)    . omeprazole (PRILOSEC) 40 MG capsule TAKE 1 CAPSULE BY MOUTH EVERY DAY (Patient taking differently: As needed) 90 capsule 0  . rosuvastatin (CRESTOR) 5 MG tablet Take 1 tablet (5 mg total) by mouth every Monday, Wednesday, and Friday. 40 tablet 3  . sildenafil (VIAGRA) 100 MG tablet Take 0.5-1 tablets (50-100 mg total) by mouth daily as needed for erectile dysfunction. 5 tablet 11   No current facility-administered medications on file prior to visit.    Review of Systems  Constitutional: Negative for activity change, appetite change, fatigue, fever and unexpected weight change.  HENT: Negative for congestion, rhinorrhea, sore throat and trouble swallowing.   Eyes: Negative for  pain, redness, itching and visual disturbance.  Respiratory: Negative for cough, chest tightness, shortness of breath and wheezing.   Cardiovascular: Negative for chest pain and palpitations.  Gastrointestinal: Negative for abdominal pain, blood in stool, constipation, diarrhea and nausea.  Endocrine: Negative for cold intolerance, heat intolerance, polydipsia and polyuria.  Genitourinary: Negative for difficulty urinating, dysuria, frequency and urgency.  Musculoskeletal: Positive for arthralgias and joint swelling. Negative for myalgias.  Skin: Negative for pallor and rash.  Neurological: Negative  for dizziness, tremors, weakness, numbness and headaches.  Hematological: Negative for adenopathy. Does not bruise/bleed easily.  Psychiatric/Behavioral: Negative for decreased concentration and dysphoric mood. The patient is not nervous/anxious.        Objective:   Physical Exam Constitutional:      General: He is not in acute distress.    Appearance: Normal appearance. He is normal weight. He is not ill-appearing.  Eyes:     General: No scleral icterus.    Conjunctiva/sclera: Conjunctivae normal.     Pupils: Pupils are equal, round, and reactive to light.  Cardiovascular:     Rate and Rhythm: Normal rate and regular rhythm.     Pulses: Normal pulses.     Heart sounds: Normal heart sounds.  Pulmonary:     Effort: Pulmonary effort is normal. No respiratory distress.     Breath sounds: Normal breath sounds. No wheezing.  Musculoskeletal:        General: Swelling and tenderness present.     Right wrist: Swelling and tenderness present. No deformity, effusion or crepitus. Normal range of motion. Normal pulse.     Right hand: Swelling, tenderness and bony tenderness present. No deformity. Normal range of motion. Normal strength. Normal sensation. There is no disruption of two-point discrimination. Normal capillary refill. Normal pulse.     Cervical back: Normal range of motion and neck  supple.     Comments: R hand is swollen dorsally/laterally along with lateral wrist  Some tenderness over soft tissue esp at base of first mcp  Pos Finkelstein's test  Grip is nl but causes discomfort   Baseline lipoma R forearm /mid -per pt no change   Lymphadenopathy:     Cervical: No cervical adenopathy.  Skin:    General: Skin is warm and dry.     Findings: Erythema present. No lesion or rash.  Neurological:     Mental Status: He is alert.     Sensory: No sensory deficit.     Motor: No weakness.     Deep Tendon Reflexes: Reflexes normal.  Psychiatric:        Mood and Affect: Mood normal.           Assessment & Plan:   Problem List Items Addressed This Visit      Other   Right hand pain    See a/p for swelling of hand      Relevant Orders   DG Wrist Complete Right   DG Hand Complete Right   Uric acid   CBC with Differential/Platelet   Basic metabolic panel   Swelling of right hand    With some erythema of lateral wrist -s/p working on a lawnmower blade  Differential includes tendonitis  (de quervain's)/gout/other arthritis/fracture  Xray ordered  Labs planned incl chem , cbc and uric acid  Recommended trial of voltaren gel Wrapped in ace (including thumb) to see if comfortable inst-relative rest and elevation and continued ice        Relevant Orders   DG Wrist Complete Right   DG Hand Complete Right   Uric acid   CBC with Differential/Platelet   Basic metabolic panel   Right wrist pain    See a/p for hand swelling and pain       Relevant Orders   DG Wrist Complete Right   DG Hand Complete Right   Uric acid   CBC with Differential/Platelet   Basic metabolic panel

## 2020-01-20 NOTE — Assessment & Plan Note (Signed)
See a/p for hand swelling and pain

## 2020-01-20 NOTE — Assessment & Plan Note (Signed)
With some erythema of lateral wrist -s/p working on a lawnmower blade  Differential includes tendonitis  (de quervain's)/gout/other arthritis/fracture  Xray ordered  Labs planned incl chem , cbc and uric acid  Recommended trial of voltaren gel Wrapped in ace (including thumb) to see if comfortable inst-relative rest and elevation and continued ice

## 2020-01-21 LAB — CBC WITH DIFFERENTIAL/PLATELET
Basophils Absolute: 0 10*3/uL (ref 0.0–0.1)
Basophils Relative: 0.5 % (ref 0.0–3.0)
Eosinophils Absolute: 0.1 10*3/uL (ref 0.0–0.7)
Eosinophils Relative: 0.6 % (ref 0.0–5.0)
HCT: 48.9 % (ref 39.0–52.0)
Hemoglobin: 16.2 g/dL (ref 13.0–17.0)
Lymphocytes Relative: 34.2 % (ref 12.0–46.0)
Lymphs Abs: 3.1 10*3/uL (ref 0.7–4.0)
MCHC: 33.2 g/dL (ref 30.0–36.0)
MCV: 96.7 fl (ref 78.0–100.0)
Monocytes Absolute: 1 10*3/uL (ref 0.1–1.0)
Monocytes Relative: 10.6 % (ref 3.0–12.0)
Neutro Abs: 5 10*3/uL (ref 1.4–7.7)
Neutrophils Relative %: 54.1 % (ref 43.0–77.0)
Platelets: 300 10*3/uL (ref 150.0–400.0)
RBC: 5.05 Mil/uL (ref 4.22–5.81)
RDW: 14 % (ref 11.5–15.5)
WBC: 9.2 10*3/uL (ref 4.0–10.5)

## 2020-01-21 LAB — BASIC METABOLIC PANEL
BUN: 14 mg/dL (ref 6–23)
CO2: 30 mEq/L (ref 19–32)
Calcium: 9.6 mg/dL (ref 8.4–10.5)
Chloride: 100 mEq/L (ref 96–112)
Creatinine, Ser: 1.16 mg/dL (ref 0.40–1.50)
GFR: 74.35 mL/min (ref >60.00)
Glucose, Bld: 84 mg/dL (ref 70–99)
Potassium: 4.1 mEq/L (ref 3.5–5.1)
Sodium: 136 mEq/L (ref 135–145)

## 2020-01-21 LAB — URIC ACID: Uric Acid, Serum: 6.8 mg/dL (ref 4.0–7.8)

## 2020-03-16 ENCOUNTER — Other Ambulatory Visit: Payer: Self-pay | Admitting: Family Medicine

## 2020-04-29 DIAGNOSIS — Z23 Encounter for immunization: Secondary | ICD-10-CM | POA: Diagnosis not present

## 2020-06-09 ENCOUNTER — Other Ambulatory Visit: Payer: Self-pay | Admitting: Family Medicine

## 2020-06-10 ENCOUNTER — Other Ambulatory Visit: Payer: Self-pay | Admitting: Family Medicine

## 2020-06-10 NOTE — Telephone Encounter (Signed)
E-scribed refill.  Plz schedule wellness, lab and cpe visits.  

## 2020-07-14 ENCOUNTER — Other Ambulatory Visit: Payer: Self-pay | Admitting: Family Medicine

## 2020-07-17 NOTE — Telephone Encounter (Signed)
Patient Scheduled EM

## 2020-07-20 ENCOUNTER — Ambulatory Visit: Payer: Medicare Other | Admitting: Family Medicine

## 2020-07-22 ENCOUNTER — Ambulatory Visit (INDEPENDENT_AMBULATORY_CARE_PROVIDER_SITE_OTHER): Payer: Medicare Other | Admitting: Family Medicine

## 2020-07-22 ENCOUNTER — Other Ambulatory Visit: Payer: Self-pay

## 2020-07-22 ENCOUNTER — Encounter: Payer: Self-pay | Admitting: Family Medicine

## 2020-07-22 DIAGNOSIS — R21 Rash and other nonspecific skin eruption: Secondary | ICD-10-CM

## 2020-07-22 MED ORDER — TRIAMCINOLONE ACETONIDE 0.1 % EX CREA: 1.0000 "application " | TOPICAL_CREAM | Freq: Two times a day (BID) | CUTANEOUS | 0 refills | Status: DC

## 2020-07-22 MED ORDER — PREDNISONE 20 MG PO TABS: ORAL_TABLET | ORAL | 0 refills | Status: DC

## 2020-07-22 NOTE — Progress Notes (Signed)
Patient ID: Gregory Abbott, male    DOB: 06/09/1946, 75 y.o.   MRN: [id number]  This visit was conducted in person.  BP 136/68 (BP Location: Left Arm, Patient Position: Sitting, Cuff Size: Large)   Pulse 73   Temp 98.1 F (36.7 C) (Temporal)   Ht 5\' 9"  (1.753 m)   Wt 193 lb 7 oz (87.7 kg)   SpO2 95%   BMI 28.57 kg/m    CC: rash  Subjective:   HPI: Gregory Abbott is a 75 y.o. male presenting on 07/22/2020 for Rash (C/o rash on lower abd and bilateral upper arms.  Started about 2 mos ago. Areas itch and occasionally burn.  )   2 mo h/o rash on abdomen and into bilateral inner upper arms and AC fossa. Bumps that easily get irritated. Very itchy, occasional burn. May have started on back (itching) - this resolved. Has been treating with cortizone-10 and OTC cream remedy as well as rubbing alcohol after shower.   He did recently change from Tide to Gain detergents - changed back to old detergent without benefit.  No new lotions, soaps or shampoos.  No new foods. No new medications or supplements.   No tick bites  No fevers/chills, nausea No oral lesions.  No known poison ivy exposure.   H/o hand eczema that improved on its own.   He was worried about possible shingles.      Relevant past medical, surgical, family and social history reviewed and updated as indicated. Interim medical history since our last visit reviewed. Allergies and medications reviewed and updated. Outpatient Medications Prior to Visit  Medication Sig Dispense Refill  . albuterol (VENTOLIN HFA) 108 (90 Base) MCG/ACT inhaler Inhale 2 puffs into the lungs every 6 (six) hours as needed for wheezing or shortness of breath. 18 g 3  . amLODipine (NORVASC) 5 MG tablet TAKE 1 TABLET BY MOUTH EVERY DAY 90 tablet 0  . aspirin EC 81 MG tablet Take 81 mg by mouth every other day.    [name] atenolol (TENORMIN) 100 MG tablet TAKE 1 TABLET (100 MG TOTAL) BY MOUTH DAILY AFTER BREAKFAST. 90 tablet 3  . Cholecalciferol (VITAMIN  D3) 25 MCG (1000 UT) CAPS Take 1 capsule (1,000 Units total) by mouth daily. (Patient taking differently: Take 1 capsule by mouth daily. Takes about twice a month) 30 capsule   . hydrochlorothiazide (HYDRODIURIL) 25 MG tablet TAKE 1 TABLET BY MOUTH EVERY DAY 90 tablet 0  . loratadine (CLARITIN) 10 MG tablet Take 1 tablet (10 mg total) by mouth daily. (Patient taking differently: Take 10 mg by mouth daily. As needed)    . omeprazole (PRILOSEC) 40 MG capsule TAKE 1 CAPSULE BY MOUTH EVERY DAY 90 capsule 0  . rosuvastatin (CRESTOR) 5 MG tablet TAKE 1 TABLET (5 MG TOTAL) BY MOUTH EVERY MONDAY, WEDNESDAY, AND FRIDAY. 40 tablet 3  . sildenafil (VIAGRA) 100 MG tablet Take 0.5-1 tablets (50-100 mg total) by mouth daily as needed for erectile dysfunction. 5 tablet 11   No facility-administered medications prior to visit.     Per HPI unless specifically indicated in ROS section below Review of Systems Objective:  BP 136/68 (BP Location: Left Arm, Patient Position: Sitting, Cuff Size: Large)   Pulse 73   Temp 98.1 F (36.7 C) (Temporal)   Ht 5\' 9"  (1.753 m)   Wt 193 lb 7 oz (87.7 kg)   SpO2 95%   BMI 28.57 kg/m   Wt Readings from Last 3  Encounters:  07/22/20 193 lb 7 oz (87.7 kg)  01/20/20 186 lb 3 oz (84.5 kg)  12/25/19 186 lb 2 oz (84.4 kg)      Physical Exam Vitals and nursing note reviewed.  Constitutional:      Appearance: Normal appearance. He is not ill-appearing.  Musculoskeletal:     Right lower leg: No edema.     Left lower leg: No edema.  Skin:    General: Skin is warm and dry.     Findings: Erythema and rash present. Rash is papular. Rash is not vesicular.          Comments: Confluent papular rash to bilateral sides and L medial upper arm, pruritic, mild scaling present  Neurological:     Mental Status: He is alert.  Psychiatric:        Mood and Affect: Mood normal.        Behavior: Behavior normal.       Results for orders placed or performed in visit on 01/20/20   Uric acid  Result Value Ref Range   Uric Acid, Serum 6.8 4.0 - 7.8 mg/dL  CBC with Differential/Platelet  Result Value Ref Range   WBC 9.2 4.0 - 10.5 K/uL   RBC 5.05 4.22 - 5.81 Mil/uL   Hemoglobin 16.2 13.0 - 17.0 g/dL   HCT 48.9 39.0 - 52.0 %   MCV 96.7 78.0 - 100.0 fl   MCHC 33.2 30.0 - 36.0 g/dL   RDW 14.0 11.5 - 15.5 %   Platelets 300.0 150.0 - 400.0 K/uL   Neutrophils Relative % 54.1 43.0 - 77.0 %   Lymphocytes Relative 34.2 12.0 - 46.0 %   Monocytes Relative 10.6 3.0 - 12.0 %   Eosinophils Relative 0.6 0.0 - 5.0 %   Basophils Relative 0.5 0.0 - 3.0 %   Neutro Abs 5.0 1.4 - 7.7 K/uL   Lymphs Abs 3.1 0.7 - 4.0 K/uL   Monocytes Absolute 1.0 0.1 - 1.0 K/uL   Eosinophils Absolute 0.1 0.0 - 0.7 K/uL   Basophils Absolute 0.0 0.0 - 0.1 K/uL  Basic metabolic panel  Result Value Ref Range   Sodium 136 135 - 145 mEq/L   Potassium 4.1 3.5 - 5.1 mEq/L   Chloride 100 96 - 112 mEq/L   CO2 30 19 - 32 mEq/L   Glucose, Bld 84 70 - 99 mg/dL   BUN 14 6 - 23 mg/dL   Creatinine, Ser 1.16 0.40 - 1.50 mg/dL   GFR 74.35 >60.00 mL/min   Calcium 9.6 8.4 - 10.5 mg/dL   Lab Results  Component Value Date   TSH 1.62 06/17/2019   T3TOTAL 139.0 07/01/2016    Assessment & Plan:  This visit occurred during the SARS-CoV-2 public health emergency.  Safety protocols were in place, including screening questions prior to the visit, additional usage of staff PPE, and extensive cleaning of exam room while observing appropriate contact time as indicated for disinfecting solutions.   Problem List Items Addressed This Visit    Skin rash    ?eczematous rash given pruritis. Reviewed with patient. Rx short prednisone course as well as mid potency topical steroid. rec moisturizing cream. Consider drug rash if persists, consider return to derm.  Saw [facility] 2018 for possible drug rash that improved off losartan.           Meds ordered this encounter  Medications  . triamcinolone (KENALOG) 0.1 %     Sig: Apply 1 application topically 2 (two) times daily.  Apply to AA.    Dispense:  45 g    Refill:  0  . predniSONE (DELTASONE) 20 MG tablet    Sig: Take two tablets daily for 3 days followed by one tablet daily for 4 days    Dispense:  10 tablet    Refill:  0   No orders of the defined types were placed in this encounter.  Patient Instructions  Possible eczema causing this rash - treat with stronger steroid cream sent to pharmacy - use twice daily for 1-2 weeks max. Also take oral prednisone taper which should help.  Let us know if not improving with this.   Follow up plan: Return if symptoms worsen or fail to improve.  Ria Bush, MD

## 2020-07-22 NOTE — Assessment & Plan Note (Signed)
?  eczematous rash given pruritis. Reviewed with patient. Rx short prednisone course as well as mid potency topical steroid. rec moisturizing cream. Consider drug rash if persists, consider return to derm.  Saw [facility] 2018 for possible drug rash that improved off losartan.

## 2020-07-22 NOTE — Patient Instructions (Addendum)
Possible eczema causing this rash - treat with stronger steroid cream sent to pharmacy - use twice daily for 1-2 weeks max. Also take oral prednisone taper which should help.  Let us know if not improving with this.

## 2020-07-24 DIAGNOSIS — Z23 Encounter for immunization: Secondary | ICD-10-CM | POA: Diagnosis not present

## 2020-08-12 ENCOUNTER — Other Ambulatory Visit: Payer: Self-pay | Admitting: Family Medicine

## 2020-09-05 ENCOUNTER — Other Ambulatory Visit: Payer: Self-pay | Admitting: Family Medicine

## 2020-09-10 ENCOUNTER — Other Ambulatory Visit: Payer: Self-pay | Admitting: Family Medicine

## 2020-09-10 NOTE — Telephone Encounter (Signed)
Kenalog Last filled:  08/13/20, #45 g Last OV:  07/22/20, skin rash Next ov:  10/20/20, AWV prt 2

## 2020-10-13 ENCOUNTER — Other Ambulatory Visit: Payer: Self-pay | Admitting: Family Medicine

## 2020-10-13 ENCOUNTER — Ambulatory Visit (INDEPENDENT_AMBULATORY_CARE_PROVIDER_SITE_OTHER): Payer: Medicare Other

## 2020-10-13 ENCOUNTER — Other Ambulatory Visit: Payer: Self-pay

## 2020-10-13 DIAGNOSIS — E1169 Type 2 diabetes mellitus with other specified complication: Secondary | ICD-10-CM

## 2020-10-13 DIAGNOSIS — Z125 Encounter for screening for malignant neoplasm of prostate: Secondary | ICD-10-CM

## 2020-10-13 DIAGNOSIS — Z0000 Encounter for general adult medical examination without abnormal findings: Secondary | ICD-10-CM | POA: Diagnosis not present

## 2020-10-13 DIAGNOSIS — E0590 Thyrotoxicosis, unspecified without thyrotoxic crisis or storm: Secondary | ICD-10-CM

## 2020-10-13 DIAGNOSIS — E118 Type 2 diabetes mellitus with unspecified complications: Secondary | ICD-10-CM

## 2020-10-13 DIAGNOSIS — I10 Essential (primary) hypertension: Secondary | ICD-10-CM

## 2020-10-13 NOTE — Progress Notes (Signed)
Subjective:   Gregory Abbott is a 75 y.o. male who presents for Medicare Annual/Subsequent preventive examination.  Review of Systems: N/A      I connected with the patient today by telephone and verified that I am speaking with the correct person using two identifiers. Location patient: home Location nurse: work Persons participating in the telephone visit: patient, nurse.   I discussed the limitations, risks, security and privacy concerns of performing an evaluation and management service by telephone and the availability of in person appointments. I also discussed with the patient that there may be a patient responsible charge related to this service. The patient expressed understanding and verbally consented to this telephonic visit.        Cardiac Risk Factors include: advanced age (>38men, >3 women);diabetes mellitus;male gender;Other (see comment), Risk factor comments: hyperlipidemia     Objective:    Today's Vitals   10/13/20 1026  PainSc: 3    There is no height or weight on file to calculate BMI.  Advanced Directives 10/13/2020 06/20/2019 06/15/2018 05/02/2016 11/07/2013  Does Patient Have a Medical Advance Directive? Yes Yes Yes Yes Patient does not have advance directive  Type of Advance Directive [place];Living will [place];Living will [place];Living will [place];Living will -  Does patient want to make changes to medical advance directive? - - - No - Patient declined -  Copy of [place] in Chart? No - copy requested No - copy requested No - copy requested No - copy requested -  Pre-existing out of facility DNR order (yellow form or pink MOST form) - - - - No    Current Medications (verified) Outpatient Encounter Medications as of 10/13/2020  Medication Sig  . albuterol (VENTOLIN HFA) 108 (90 Base) MCG/ACT inhaler Inhale 2 puffs into the lungs every 6 (six) hours  as needed for wheezing or shortness of breath.  [name] amLODipine (NORVASC) 5 MG tablet TAKE 1 TABLET BY MOUTH EVERY DAY  . aspirin EC 81 MG tablet Take 81 mg by mouth every other day.  [name] atenolol (TENORMIN) 100 MG tablet TAKE 1 TABLET (100 MG TOTAL) BY MOUTH DAILY AFTER BREAKFAST.  [name] Cholecalciferol (VITAMIN D3) 25 MCG (1000 UT) CAPS Take 1 capsule (1,000 Units total) by mouth daily. (Patient taking differently: Take 1 capsule by mouth daily. Takes about twice a month)  . hydrochlorothiazide (HYDRODIURIL) 25 MG tablet TAKE 1 TABLET BY MOUTH EVERY DAY  . loratadine (CLARITIN) 10 MG tablet Take 1 tablet (10 mg total) by mouth daily. (Patient taking differently: Take 10 mg by mouth daily. As needed)  . omeprazole (PRILOSEC) 40 MG capsule TAKE 1 CAPSULE BY MOUTH EVERY DAY  . rosuvastatin (CRESTOR) 5 MG tablet TAKE 1 TABLET (5 MG TOTAL) BY MOUTH EVERY MONDAY, WEDNESDAY, AND FRIDAY.  . sildenafil (VIAGRA) 100 MG tablet TAKE 1/2 - 1 TABLETS (50-100 MG TOTAL) BY MOUTH DAILY AS NEEDED FOR ERECTILE DYSFUNCTION.  [name] triamcinolone (KENALOG) 0.1 % APPLY TO AFFECTED AREA TWICE A DAY  . predniSONE (DELTASONE) 20 MG tablet Take two tablets daily for 3 days followed by one tablet daily for 4 days (Patient not taking: Reported on 10/13/2020)   No facility-administered encounter medications on file as of 10/13/2020.    Allergies (verified) Penicillins, Azithromycin, and Losartan   History: Past Medical History:  Diagnosis Date  . Blood transfusion without reported diagnosis   . BPH (benign prostatic hypertrophy)   . Cataract   .  Childhood asthma   . COPD (chronic obstructive pulmonary disease) ([place])   . Eczema   . Ex-smoker    39 PY hx  . GERD (gastroesophageal reflux disease)   . History of chicken pox   . Hypertension   . Lower back pain    h/o herniated and bulging discs lower back  . Osteoarthritis of neck   . Personal history of colonic polyps 05/07/2013   resected not retrieved  . PUD (peptic ulcer  disease)   . S/P splenectomy    partial  . Shortness of breath    Past Surgical History:  Procedure Laterality Date  . AAA screen  04/2013   neg for AAA  . CATARACT EXTRACTION Left 05/2013   [facility]  . CATARACT EXTRACTION W/PHACO Right 10/06/2015   Procedure: CATARACT EXTRACTION PHACO AND INTRAOCULAR LENS PLACEMENT (IOC);  Surgeon: Birder Robson, MD;  Location: [facility] ORS;  Service: Ophthalmology;  Laterality: Right;  [place] AP% CDE fluid pack lot 3 [id number] H exp 04/02/2017  . COLONOSCOPY  2009  . COLONOSCOPY  05/2013   1 polyp, int/ext hemorrhoids Carlean Purl)  . COLONOSCOPY  08/2018   Diminutive adenoma, no f/u planned Carlean Purl)  . KNEE ARTHROSCOPY    . lower extremity arterial doppler  2013   WNL  . SPLENECTOMY, PARTIAL  1998   fall down stairs   Family History  Problem Relation Age of Onset  . Diabetes Mother   . Hypertension Mother   . Hyperlipidemia Mother   . Heart disease Mother   . Stroke Father   . Colon cancer Neg Hx   . Rectal cancer Neg Hx   . Stomach cancer Neg Hx   . Esophageal cancer Neg Hx    Social History   Socioeconomic History  . Marital status: Married    Spouse name: Not on file  . Number of children: 2  . Years of education: Not on file  . Highest education level: Not on file  Occupational History  . Occupation: Retired  Tobacco Use  . Smoking status: Former Smoker    Packs/day: 0.25    Types: Cigarettes    Quit date: 11/06/2013    Years since quitting: 6.9  . Smokeless tobacco: Never Used  Vaping Use  . Vaping Use: Never used  Substance and Sexual Activity  . Alcohol use: Yes    Alcohol/week: 2.0 standard drinks    Types: 1 Cans of beer, 1 Shots of liquor per week    Comment: occasional  . Drug use: No  . Sexual activity: Yes  Other Topics Concern  . Not on file  Social History Narrative   Lives with wife, no pets   Occupation: retired, Engineer, mining   Edu: Degree   Activity: housework, no regular exercise   Diet: good water,  fruits/vegetables daily   Social Determinants of Health   Financial Resource Strain: Low Risk   . Difficulty of Paying Living Expenses: Not hard at all  Food Insecurity: No Food Insecurity  . Worried About Charity fundraiser in the Last Year: Never true  . Ran Out of Food in the Last Year: Never true  Transportation Needs: No Transportation Needs  . Lack of Transportation (Medical): No  . Lack of Transportation (Non-Medical): No  Physical Activity: Inactive  . Days of Exercise per Week: 0 days  . Minutes of Exercise per Session: 0 min  Stress: No Stress Concern Present  . Feeling of Stress : Not at all  Social Connections: Not  on file    Tobacco Counseling Counseling given: Not Answered   Clinical Intake:  Pre-visit preparation completed: Yes  Pain : 0-10 Pain Score: 3  Pain Type: Chronic pain Pain Location: Arm (arms and legs) Pain Orientation: Left,Right Pain Descriptors / Indicators: Aching Pain Onset: More than a month ago Pain Frequency: Intermittent     Nutritional Risks: None Diabetes: Yes CBG done?: No Did pt. bring in CBG monitor from home?: No  How often do you need to have someone help you when you read instructions, pamphlets, or other written materials from your doctor or pharmacy?: 1 - Never  Diabetic: Yes Nutrition Risk Assessment:   Has the patient had any N/V/D within the last 2 months?  No  Does the patient have any non-healing wounds?  No  Has the patient had any unintentional weight loss or weight gain?  No   Diabetes:  Is the patient diabetic?  Yes  If diabetic, was a CBG obtained today?  No  telephone visit  Did the patient bring in their glucometer from home?  No  telephone visit  How often do you monitor your CBG's? Only at doctors visits.   Financial Strains and Diabetes Management:  Are you having any financial strains with the device, your supplies or your medication? No .  Does the patient want to be seen by Chronic Care  Management for management of their diabetes?  No  Would the patient like to be referred to a Nutritionist or for Diabetic Management?  No   Diabetic Exams:  Diabetic Eye Exam: Completed 08/15/2020 Diabetic Foot Exam: Completed 12/25/2019   Interpreter Needed?: No  Information entered by :: CJohnson, LPN   Activities of Daily Living In your present state of health, do you have any difficulty performing the following activities: 10/13/2020  Hearing? N  Vision? N  Difficulty concentrating or making decisions? N  Walking or climbing stairs? N  Dressing or bathing? N  Doing errands, shopping? N  Preparing Food and eating ? N  Using the Toilet? N  In the past six months, have you accidently leaked urine? N  Do you have problems with loss of bowel control? N  Managing your Medications? N  Managing your Finances? N  Housekeeping or managing your Housekeeping? N  Some recent data might be hidden    Patient Care Team: Ria Bush, MD as PCP - General (Family Medicine) Birder Robson, MD as Referring Physician (Ophthalmology)  Indicate any recent Medical Services you may have received from other than [facility] providers in the past year (date may be approximate).     Assessment:   This is a routine wellness examination for [place].  Hearing/Vision screen  Hearing Screening   125Hz  250Hz  500Hz  1000Hz  2000Hz  3000Hz  4000Hz  6000Hz  8000Hz   Right ear:           Left ear:           Vision Screening Comments: Patient gets annual eye exams   Dietary issues and exercise activities discussed: Current Exercise Habits: The patient does not participate in regular exercise at present, Exercise limited by: None identified  Goals    . Increase physical activity     Starting 06/15/2018, I will continue to walk at least 30 min 2 days per week.     . Patient Stated     06/20/2019, I will maintain and continue medications as prescribed.     . Patient Stated     10/13/2020, I will continue  to walk  when weather permits and yardwork.      Depression Screen PHQ 2/9 Scores 10/13/2020 06/20/2019 06/15/2018 06/08/2017 05/02/2016 04/03/2015 02/01/2013  PHQ - 2 Score 0 0 0 0 0 0 0  PHQ- 9 Score 0 0 0 - - - -    Fall Risk Fall Risk  10/13/2020 06/20/2019 05/29/2019 06/15/2018 06/08/2017  Falls in the past year? 0 0 0 0 No  Comment - - Emmi Telephone Survey: data to providers prior to load - -  Number falls in past yr: 0 0 - - -  Injury with Fall? 0 0 - - -  Risk for fall due to : Medication side effect Medication side effect - - -  Follow up Falls evaluation completed;Falls prevention discussed Falls prevention discussed;Falls evaluation completed - - -    FALL RISK PREVENTION PERTAINING TO THE HOME:  Any stairs in or around the home? Yes  If so, are there any without handrails? No  Home free of loose throw rugs in walkways, pet beds, electrical cords, etc? Yes  Adequate lighting in your home to reduce risk of falls? Yes   ASSISTIVE DEVICES UTILIZED TO PREVENT FALLS:  Life alert? No  Use of a cane, walker or w/c? No  Grab bars in the bathroom? No  Shower chair or bench in shower? No  Elevated toilet seat or a handicapped toilet? No   TIMED UP AND GO:  Was the test performed? N/A telephone visit .    Cognitive Function: MMSE - Mini Mental State Exam 10/13/2020 06/20/2019 06/15/2018 05/02/2016  Not completed: Refused - - -  Orientation to time - 5 5 5   Orientation to Place - 5 5 5   Registration - 3 3 3   Attention/ Calculation - 5 0 0  Recall - 3 3 3   Language- name 2 objects - - 0 0  Language- repeat - 1 1 1   Language- follow 3 step command - - 3 3  Language- read & follow direction - - 0 0  Write a sentence - - 0 0  Copy design - - 0 0  Total score - - 20 20  Mini Cog  Mini-Cog screen was not completed. Patient refused at this time. Maximum score is 22. A value of 0 denotes this part of the MMSE was not completed or the patient failed this part of the Mini-Cog  screening.       Immunizations Immunization History  Administered Date(s) Administered  . Fluad Quad(high Dose 65+) 04/29/2020  . Influenza, High Dose Seasonal PF 04/05/2017, 04/03/2019  . Influenza,inj,Quad PF,6+ Mos 03/30/2015, 05/02/2016, 04/20/2018  . Influenza-Unspecified 04/03/2014, 05/04/2014  . Moderna Sars-Covid-2 Vaccination 08/16/2019, 09/16/2019, 07/24/2020  . Pneumococcal Conjugate-13 11/14/2013  . Pneumococcal Polysaccharide-23 04/03/2015  . Tdap 01/31/2011    TDAP status: Up to date  Flu Vaccine status: Up to date  Pneumococcal vaccine status: Up to date  Covid-19 vaccine status: Completed vaccines  Qualifies for Shingles Vaccine? Yes   Zostavax completed No   Shingrix Completed?: No.    Education has been provided regarding the importance of this vaccine. Patient has been advised to call insurance company to determine out of pocket expense if they have not yet received this vaccine. Advised may also receive vaccine at local pharmacy or Health Dept. Verbalized acceptance and understanding.  Screening Tests Health Maintenance  Topic Date Due  . URINE MICROALBUMIN  06/16/2020  . HEMOGLOBIN A1C  06/25/2020  . FOOT EXAM  12/24/2020  . TETANUS/TDAP  01/30/2021  .  INFLUENZA VACCINE  02/01/2021  . OPHTHALMOLOGY EXAM  08/15/2021  . COLONOSCOPY (Pts 45-8yrs Insurance coverage will need to be confirmed)  08/16/2023  . COVID-19 Vaccine  Completed  . Hepatitis C Screening  Completed  . PNA vac Low Risk Adult  Completed  . HPV VACCINES  Aged Out    Health Maintenance  Health Maintenance Due  Topic Date Due  . URINE MICROALBUMIN  06/16/2020  . HEMOGLOBIN A1C  06/25/2020    Colorectal cancer screening: Type of screening: Colonoscopy. Completed 08/15/2018. Repeat every 5 years  Lung Cancer Screening: (Low Dose CT Chest recommended if Age 38-80 years, 30 pack-year currently smoking OR have quit w/in 15years.) does not qualify.    Additional  Screening:  Hepatitis C Screening: does qualify; Completed 05/02/2016  Vision Screening: Recommended annual ophthalmology exams for early detection of glaucoma and other disorders of the eye. Is the patient up to date with their annual eye exam?  Yes  Who is the provider or what is the name of the office in which the patient attends annual eye exams? [facility] If pt is not established with a provider, would they like to be referred to a provider to establish care? No .   Dental Screening: Recommended annual dental exams for proper oral hygiene  Community Resource Referral / Chronic Care Management: CRR required this visit?  No   CCM required this visit?  No      Plan:     I have personally reviewed and noted the following in the patient's chart:   . Medical and social history . Use of alcohol, tobacco or illicit drugs  . Current medications and supplements . Functional ability and status . Nutritional status . Physical activity . Advanced directives . List of other physicians . Hospitalizations, surgeries, and ER visits in previous 12 months . Vitals . Screenings to include cognitive, depression, and falls . Referrals and appointments  In addition, I have reviewed and discussed with patient certain preventive protocols, quality metrics, and best practice recommendations. A written personalized care plan for preventive services as well as general preventive health recommendations were provided to patient.   Due to this being a telephonic visit, the after visit summary with patients personalized plan was offered to patient via office or my-chart. Patient preferred to pick up at office at next visit or via mychart.   Andrez Grime, LPN   08/11/221

## 2020-10-13 NOTE — Patient Instructions (Signed)
Gregory Abbott , Thank you for taking time to come for your Medicare Wellness Visit. I appreciate your ongoing commitment to your health goals. Please review the following plan we discussed and let me know if I can assist you in the future.   Screening recommendations/referrals: Colonoscopy: Up to date, completed 08/15/2018, due 08/2023 Recommended yearly ophthalmology/optometry visit for glaucoma screening and checkup Recommended yearly dental visit for hygiene and checkup  Vaccinations: Influenza vaccine: Up to date, completed 04/29/2020, due 02/2021 Pneumococcal vaccine: Completed series Tdap vaccine: Up to date, completed 01/31/2011, due 01/2021 Shingles vaccine: due, check with your insurance regarding coverage if interested    Covid-19: Completed series  Advanced directives: Please bring a copy of your POA (Power of [place]) and/or Living Will to your next appointment.   Conditions/risks identified: diabetes, hyperlipidemia   Next appointment: Follow up in one year for your annual wellness visit.   Preventive Care 75 Years and Older, Male Preventive care refers to lifestyle choices and visits with your health care provider that can promote health and wellness. What does preventive care include?  A yearly physical exam. This is also called an annual well check.  Dental exams once or twice a year.  Routine eye exams. Ask your health care provider how often you should have your eyes checked.  Personal lifestyle choices, including:  Daily care of your teeth and gums.  Regular physical activity.  Eating a healthy diet.  Avoiding tobacco and drug use.  Limiting alcohol use.  Practicing safe sex.  Taking low doses of aspirin every day.  Taking vitamin and mineral supplements as recommended by your health care provider. What happens during an annual well check? The services and screenings done by your health care provider during your annual well check will depend on your age,  overall health, lifestyle risk factors, and family history of disease. Counseling  Your health care provider may ask you questions about your:  Alcohol use.  Tobacco use.  Drug use.  Emotional well-being.  Home and relationship well-being.  Sexual activity.  Eating habits.  History of falls.  Memory and ability to understand (cognition).  Work and work Statistician. Screening  You may have the following tests or measurements:  Height, weight, and BMI.  Blood pressure.  Lipid and cholesterol levels. These may be checked every 5 years, or more frequently if you are over 3 years old.  Skin check.  Lung cancer screening. You may have this screening every year starting at age 53 if you have a 30-pack-year history of smoking and currently smoke or have quit within the past 15 years.  Fecal occult blood test (FOBT) of the stool. You may have this test every year starting at age 73.  Flexible sigmoidoscopy or colonoscopy. You may have a sigmoidoscopy every 5 years or a colonoscopy every 10 years starting at age 15.  Prostate cancer screening. Recommendations will vary depending on your family history and other risks.  Hepatitis C blood test.  Hepatitis B blood test.  Sexually transmitted disease (STD) testing.  Diabetes screening. This is done by checking your blood sugar (glucose) after you have not eaten for a while (fasting). You may have this done every 1-3 years.  Abdominal aortic aneurysm (AAA) screening. You may need this if you are a current or former smoker.  Osteoporosis. You may be screened starting at age 80 if you are at high risk. Talk with your health care provider about your test results, treatment options, and if necessary, the  need for more tests. Vaccines  Your health care provider may recommend certain vaccines, such as:  Influenza vaccine. This is recommended every year.  Tetanus, diphtheria, and acellular pertussis (Tdap, Td) vaccine. You may  need a Td booster every 10 years.  Zoster vaccine. You may need this after age 64.  Pneumococcal 13-valent conjugate (PCV13) vaccine. One dose is recommended after age 22.  Pneumococcal polysaccharide (PPSV23) vaccine. One dose is recommended after age 24. Talk to your health care provider about which screenings and vaccines you need and how often you need them. This information is not intended to replace advice given to you by your health care provider. Make sure you discuss any questions you have with your health care provider. Document Released: 07/17/2015 Document Revised: 03/09/2016 Document Reviewed: 04/21/2015 Elsevier Interactive Patient Education  2017 [place] Prevention in the Home Falls can cause injuries. They can happen to people of all ages. There are many things you can do to make your home safe and to help prevent falls. What can I do on the outside of my home?  Regularly fix the edges of walkways and driveways and fix any cracks.  Remove anything that might make you trip as you walk through a door, such as a raised step or threshold.  Trim any bushes or trees on the path to your home.  Use bright outdoor lighting.  Clear any walking paths of anything that might make someone trip, such as rocks or tools.  Regularly check to see if handrails are loose or broken. Make sure that both sides of any steps have handrails.  Any raised decks and porches should have guardrails on the edges.  Have any leaves, snow, or ice cleared regularly.  Use sand or salt on walking paths during winter.  Clean up any spills in your garage right away. This includes oil or grease spills. What can I do in the bathroom?  Use night lights.  Install grab bars by the toilet and in the tub and shower. Do not use towel bars as grab bars.  Use non-skid mats or decals in the tub or shower.  If you need to sit down in the shower, use a plastic, non-slip stool.  Keep the floor  dry. Clean up any water that spills on the floor as soon as it happens.  Remove soap buildup in the tub or shower regularly.  Attach bath mats securely with double-sided non-slip rug tape.  Do not have throw rugs and other things on the floor that can make you trip. What can I do in the bedroom?  Use night lights.  Make sure that you have a light by your bed that is easy to reach.  Do not use any sheets or blankets that are too big for your bed. They should not hang down onto the floor.  Have a firm chair that has side arms. You can use this for support while you get dressed.  Do not have throw rugs and other things on the floor that can make you trip. What can I do in the kitchen?  Clean up any spills right away.  Avoid walking on wet floors.  Keep items that you use a lot in easy-to-reach places.  If you need to reach something above you, use a strong step stool that has a grab bar.  Keep electrical cords out of the way.  Do not use floor polish or wax that makes floors slippery. If you must use wax,  use non-skid floor wax.  Do not have throw rugs and other things on the floor that can make you trip. What can I do with my stairs?  Do not leave any items on the stairs.  Make sure that there are handrails on both sides of the stairs and use them. Fix handrails that are broken or loose. Make sure that handrails are as long as the stairways.  Check any carpeting to make sure that it is firmly attached to the stairs. Fix any carpet that is loose or worn.  Avoid having throw rugs at the top or bottom of the stairs. If you do have throw rugs, attach them to the floor with carpet tape.  Make sure that you have a light switch at the top of the stairs and the bottom of the stairs. If you do not have them, ask someone to add them for you. What else can I do to help prevent falls?  Wear shoes that:  Do not have high heels.  Have rubber bottoms.  Are comfortable and fit you  well.  Are closed at the toe. Do not wear sandals.  If you use a stepladder:  Make sure that it is fully opened. Do not climb a closed stepladder.  Make sure that both sides of the stepladder are locked into place.  Ask someone to hold it for you, if possible.  Clearly mark and make sure that you can see:  Any grab bars or handrails.  First and last steps.  Where the edge of each step is.  Use tools that help you move around (mobility aids) if they are needed. These include:  Canes.  Walkers.  Scooters.  Crutches.  Turn on the lights when you go into a dark area. Replace any light bulbs as soon as they burn out.  Set up your furniture so you have a clear path. Avoid moving your furniture around.  If any of your floors are uneven, fix them.  If there are any pets around you, be aware of where they are.  Review your medicines with your doctor. Some medicines can make you feel dizzy. This can increase your chance of falling. Ask your doctor what other things that you can do to help prevent falls. This information is not intended to replace advice given to you by your health care provider. Make sure you discuss any questions you have with your health care provider. Document Released: 04/16/2009 Document Revised: 11/26/2015 Document Reviewed: 07/25/2014 Elsevier Interactive Patient Education  2017 [employer].

## 2020-10-13 NOTE — Progress Notes (Signed)
PCP notes:  Health Maintenance: No gaps noted    Abnormal Screenings: none   Patient concerns: Referral to ENT for ear wax removal   Nurse concerns: none   Next PCP appt: 10/20/2020 @ 9:30 am

## 2020-10-14 ENCOUNTER — Other Ambulatory Visit: Payer: Self-pay

## 2020-10-14 ENCOUNTER — Other Ambulatory Visit (INDEPENDENT_AMBULATORY_CARE_PROVIDER_SITE_OTHER): Payer: Medicare Other

## 2020-10-14 DIAGNOSIS — E785 Hyperlipidemia, unspecified: Secondary | ICD-10-CM | POA: Diagnosis not present

## 2020-10-14 DIAGNOSIS — I10 Essential (primary) hypertension: Secondary | ICD-10-CM

## 2020-10-14 DIAGNOSIS — E1169 Type 2 diabetes mellitus with other specified complication: Secondary | ICD-10-CM | POA: Diagnosis not present

## 2020-10-14 DIAGNOSIS — Z125 Encounter for screening for malignant neoplasm of prostate: Secondary | ICD-10-CM | POA: Diagnosis not present

## 2020-10-14 DIAGNOSIS — E118 Type 2 diabetes mellitus with unspecified complications: Secondary | ICD-10-CM

## 2020-10-14 DIAGNOSIS — E0590 Thyrotoxicosis, unspecified without thyrotoxic crisis or storm: Secondary | ICD-10-CM | POA: Diagnosis not present

## 2020-10-14 LAB — COMPREHENSIVE METABOLIC PANEL
ALT: 13 U/L (ref 0–53)
AST: 13 U/L (ref 0–37)
Albumin: 4 g/dL (ref 3.5–5.2)
Alkaline Phosphatase: 53 U/L (ref 39–117)
BUN: 12 mg/dL (ref 6–23)
CO2: 30 mEq/L (ref 19–32)
Calcium: 9.8 mg/dL (ref 8.4–10.5)
Chloride: 103 mEq/L (ref 96–112)
Creatinine, Ser: 0.97 mg/dL (ref 0.40–1.50)
GFR: 76.46 mL/min (ref >60.00)
Glucose, Bld: 121 mg/dL — ABNORMAL HIGH (ref 70–99)
Potassium: 4.4 mEq/L (ref 3.5–5.1)
Sodium: 140 mEq/L (ref 135–145)
Total Bilirubin: 0.9 mg/dL (ref 0.2–1.2)
Total Protein: 7.4 g/dL (ref 6.0–8.3)

## 2020-10-14 LAB — LIPID PANEL
Cholesterol: 159 mg/dL (ref 0–200)
HDL: 45 mg/dL (ref >39.00)
LDL Cholesterol: 95 mg/dL (ref 0–99)
NonHDL: 114.14
Total CHOL/HDL Ratio: 4
Triglycerides: 98 mg/dL (ref 0.0–149.0)
VLDL: 19.6 mg/dL (ref 0.0–40.0)

## 2020-10-14 LAB — TSH: TSH: 1.41 u[IU]/mL (ref 0.35–4.50)

## 2020-10-14 LAB — MICROALBUMIN / CREATININE URINE RATIO
Creatinine,U: 149.2 mg/dL
Microalb Creat Ratio: 0.6 mg/g (ref 0.0–30.0)
Microalb, Ur: 0.9 mg/dL (ref 0.0–1.9)

## 2020-10-14 LAB — PSA, MEDICARE: PSA: 1.81 ng/ml (ref 0.10–4.00)

## 2020-10-14 LAB — HEMOGLOBIN A1C: Hgb A1c MFr Bld: 6.8 % — ABNORMAL HIGH (ref 4.6–6.5)

## 2020-10-14 LAB — T4, FREE: Free T4: 1.07 ng/dL (ref 0.60–1.60)

## 2020-10-20 ENCOUNTER — Other Ambulatory Visit: Payer: Self-pay

## 2020-10-20 ENCOUNTER — Encounter: Payer: Self-pay | Admitting: Family Medicine

## 2020-10-20 ENCOUNTER — Ambulatory Visit (INDEPENDENT_AMBULATORY_CARE_PROVIDER_SITE_OTHER): Payer: Medicare Other | Admitting: Family Medicine

## 2020-10-20 VITALS — BP 140/76 | HR 74 | Temp 98.2°F | Ht 69.0 in | Wt 196.1 lb

## 2020-10-20 DIAGNOSIS — E1169 Type 2 diabetes mellitus with other specified complication: Secondary | ICD-10-CM

## 2020-10-20 DIAGNOSIS — Z23 Encounter for immunization: Secondary | ICD-10-CM

## 2020-10-20 DIAGNOSIS — E0590 Thyrotoxicosis, unspecified without thyrotoxic crisis or storm: Secondary | ICD-10-CM | POA: Diagnosis not present

## 2020-10-20 DIAGNOSIS — H6121 Impacted cerumen, right ear: Secondary | ICD-10-CM | POA: Diagnosis not present

## 2020-10-20 DIAGNOSIS — R21 Rash and other nonspecific skin eruption: Secondary | ICD-10-CM | POA: Diagnosis not present

## 2020-10-20 DIAGNOSIS — E785 Hyperlipidemia, unspecified: Secondary | ICD-10-CM | POA: Diagnosis not present

## 2020-10-20 DIAGNOSIS — Z87891 Personal history of nicotine dependence: Secondary | ICD-10-CM | POA: Diagnosis not present

## 2020-10-20 DIAGNOSIS — E118 Type 2 diabetes mellitus with unspecified complications: Secondary | ICD-10-CM | POA: Diagnosis not present

## 2020-10-20 DIAGNOSIS — I10 Essential (primary) hypertension: Secondary | ICD-10-CM | POA: Diagnosis not present

## 2020-10-20 DIAGNOSIS — Z7189 Other specified counseling: Secondary | ICD-10-CM

## 2020-10-20 DIAGNOSIS — J449 Chronic obstructive pulmonary disease, unspecified: Secondary | ICD-10-CM | POA: Diagnosis not present

## 2020-10-20 DIAGNOSIS — K219 Gastro-esophageal reflux disease without esophagitis: Secondary | ICD-10-CM | POA: Diagnosis not present

## 2020-10-20 DIAGNOSIS — H6120 Impacted cerumen, unspecified ear: Secondary | ICD-10-CM | POA: Insufficient documentation

## 2020-10-20 MED ORDER — OMEPRAZOLE 40 MG PO CPDR: 40.0000 mg | DELAYED_RELEASE_CAPSULE | Freq: Every day | ORAL | 3 refills | Status: DC

## 2020-10-20 MED ORDER — HYDROCHLOROTHIAZIDE 25 MG PO TABS: 1.0000 | ORAL_TABLET | Freq: Every day | ORAL | 3 refills | Status: DC

## 2020-10-20 MED ORDER — ATENOLOL 100 MG PO TABS: 100.0000 mg | ORAL_TABLET | Freq: Every day | ORAL | 3 refills | Status: DC

## 2020-10-20 MED ORDER — TRIAMCINOLONE ACETONIDE 0.1 % EX CREA: TOPICAL_CREAM | CUTANEOUS | 1 refills | Status: DC

## 2020-10-20 MED ORDER — AMLODIPINE BESYLATE 5 MG PO TABS: 1.0000 | ORAL_TABLET | Freq: Every day | ORAL | 3 refills | Status: DC

## 2020-10-20 MED ORDER — ROSUVASTATIN CALCIUM 5 MG PO TABS: 5.0000 mg | ORAL_TABLET | ORAL | 3 refills | Status: DC

## 2020-10-20 NOTE — Assessment & Plan Note (Signed)
Chronic Diet controlled 

## 2020-10-20 NOTE — Assessment & Plan Note (Signed)
Chronic, stable on current regimen - continue current regimen.

## 2020-10-20 NOTE — Assessment & Plan Note (Addendum)
Continue PRN triamcinolone.  Discussed topical steroid use and steroid holiday for skin.

## 2020-10-20 NOTE — Progress Notes (Signed)
Patient ID: Gregory Abbott, male    DOB: 08/21/1945, 75 y.o.   MRN: [id number]  This visit was conducted in person.  BP 140/76   Pulse 74   Temp 98.2 F (36.8 C) (Temporal)   Ht 5\' 9"  (1.753 m)   Wt 196 lb 2 oz (89 kg)   SpO2 95%   BMI 28.96 kg/m    CC: AMW f/u visit  Subjective:   HPI: Gregory Abbott is a 75 y.o. male presenting on 10/20/2020 for Annual Exam (Prt 2. ) and Referral (Requests referral to ENT due to itching ears and excessive wax build up. )   Saw health advisor last week for medicare wellness visit. Note reviewed.    No exam data present  Flowsheet Row Clinical Support from 10/13/2020 in [facility]  PHQ-2 Total Score 0      Fall Risk  10/13/2020 06/20/2019 05/29/2019 06/15/2018 06/08/2017  Falls in the past year? 0 0 0 0 No  Comment - - Emmi Telephone Survey: data to providers prior to load - -  Number falls in past yr: 0 0 - - -  Injury with Fall? 0 0 - - -  Risk for fall due to : Medication side effect Medication side effect - - -  Follow up Falls evaluation completed;Falls prevention discussed Falls prevention discussed;Falls evaluation completed - - -  Notes intermittent joint pains to shoulder, knees, wrists and feet without associated redness/swelling or warmth of joints.   Requests ENT eval for wax removal. Denies significant trouble with hearing loss but notes irritation, itching, bothersome wax to ears. He does use q tips to dry ears after shower.   Notes some exertional dyspnea. Endorses childhood asthma, denies COPD.   Preventative: COLONOSCOPY 08/2018 - Diminutive adenoma, no f/u planned Carlean Purl) Prostate cancer screening - discussed,normal DRE 2017, monitoring with yearly PSA. No fmhx prostate cancer. Nocturia x3-4.  Lung cancer screening - ~25 PY hx. Quit ~2014, smoked late teens to 33s.  Flu shot - yearly COVID vaccine Moderna 08/2019, 09/2019, booster 07/2020 Tdap - 2012 Prevnar 11/2013, pneumovax2016, update  today- s/p splenectomy will need Q5 yrs  Shingrix - discussed  Advanced directives -daughtertobe HCPOA- new advanced directive packet previously provided.Asked to bring [place] copy for chart.  Seat belt use discussed. Sunscreen use discussed. No changing moles on skin. Ex smoker - quit~2015. ~20 PY hx.  Alcohol -social (backed off due to itchy rash).  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 Interim medical history since our last visit reviewed. Allergies and medications reviewed and updated. Outpatient Medications Prior to Visit  Medication Sig Dispense Refill  . albuterol (VENTOLIN HFA) 108 (90 Base) MCG/ACT inhaler Inhale 2 puffs into the lungs every 6 (six) hours as needed for wheezing or shortness of breath. 18 g 3  . aspirin EC 81 MG tablet Take 81 mg by mouth every other day.    . Cholecalciferol (VITAMIN D3) 25 MCG (1000 UT) CAPS Take 1 capsule (1,000 Units total) by mouth daily. (Patient taking differently: Take 1 capsule by mouth daily. Takes about twice a month) 30 capsule   . loratadine (CLARITIN) 10 MG tablet Take 1  tablet (10 mg total) by mouth daily. (Patient taking differently: Take 10 mg by mouth daily. As needed)    . sildenafil (VIAGRA) 100 MG tablet TAKE 1/2 - 1 TABLETS (50-100 MG TOTAL) BY MOUTH DAILY AS NEEDED FOR ERECTILE DYSFUNCTION. 5 tablet 11  . amLODipine (NORVASC) 5 MG tablet TAKE 1 TABLET BY MOUTH EVERY DAY 90 tablet 0  . atenolol (TENORMIN) 100 MG tablet TAKE 1 TABLET (100 MG TOTAL) BY MOUTH DAILY AFTER BREAKFAST. 90 tablet 3  . hydrochlorothiazide (HYDRODIURIL) 25 MG tablet TAKE 1 TABLET BY MOUTH EVERY DAY 90 tablet 0  . omeprazole (PRILOSEC)  40 MG capsule TAKE 1 CAPSULE BY MOUTH EVERY DAY 90 capsule 0  . rosuvastatin (CRESTOR) 5 MG tablet TAKE 1 TABLET (5 MG TOTAL) BY MOUTH EVERY MONDAY, WEDNESDAY, AND FRIDAY. 40 tablet 3  . triamcinolone (KENALOG) 0.1 % APPLY TO AFFECTED AREA TWICE A DAY (Patient taking differently: As needed) 30 g 1  . predniSONE (DELTASONE) 20 MG tablet Take two tablets daily for 3 days followed by one tablet daily for 4 days (Patient not taking: Reported on 10/13/2020) 10 tablet 0   No facility-administered medications prior to visit.     Per HPI unless specifically indicated in ROS section below Review of Systems Objective:  BP 140/76   Pulse 74   Temp 98.2 F (36.8 C) (Temporal)   Ht 5\' 9"  (1.753 m)   Wt 196 lb 2 oz (89 kg)   SpO2 95%   BMI 28.96 kg/m   Wt Readings from Last 3 Encounters:  10/20/20 196 lb 2 oz (89 kg)  07/22/20 193 lb 7 oz (87.7 kg)  01/20/20 186 lb 3 oz (84.5 kg)      Physical Exam Vitals and nursing note reviewed.  Constitutional:      General: He is not in acute distress.    Appearance: Normal appearance. He is well-developed. He is not ill-appearing.  HENT:     Head: Normocephalic and atraumatic.     Right Ear: Hearing, ear canal and external ear normal. There is impacted cerumen.     Left Ear: Hearing, tympanic membrane, ear canal and external ear normal.  Eyes:     General: No scleral icterus.    Extraocular Movements: Extraocular movements intact.     Conjunctiva/sclera: Conjunctivae normal.     Pupils: Pupils are equal, round, and reactive to light.  Neck:     Thyroid: No thyroid mass or thyromegaly.     Vascular: No carotid bruit.  Cardiovascular:     Rate and Rhythm: Normal rate and regular rhythm.     Pulses: Normal pulses.          Radial pulses are 2+ on the right side and 2+ on the left side.     Heart sounds: Normal heart sounds. No murmur heard.   Pulmonary:     Effort: Pulmonary effort is normal. No respiratory distress.     Breath sounds: Rhonchi  (few scattered) present. No wheezing or rales.  Abdominal:     General: Abdomen is flat. Bowel sounds are normal. There is no distension.     Palpations: Abdomen is soft. There is no mass.     Tenderness: There is no abdominal tenderness. There is no guarding or rebound.     Hernia: No hernia is present.  Musculoskeletal:        General: Normal range of motion.     Cervical back: Normal range of motion and neck supple.  Right lower leg: No edema.     Left lower leg: No edema.  Lymphadenopathy:     Cervical: No cervical adenopathy.  Skin:    General: Skin is warm and dry.     Findings: No rash.  Neurological:     General: No focal deficit present.     Mental Status: He is alert and oriented to person, place, and time.     Comments: CN grossly intact, station and gait intact  Psychiatric:        Mood and Affect: Mood normal.        Behavior: Behavior normal.        Thought Content: Thought content normal.        Judgment: Judgment normal.       Results for orders placed or performed in visit on 10/14/20  PSA, Medicare  Result Value Ref Range   PSA 1.81 0.10 - 4.00 ng/ml  T4, free  Result Value Ref Range   Free T4 1.07 0.60 - 1.60 ng/dL  TSH  Result Value Ref Range   TSH 1.41 0.35 - 4.50 uIU/mL  Microalbumin / creatinine urine ratio  Result Value Ref Range   Microalb, Ur 0.9 0.0 - 1.9 mg/dL   Creatinine,U 149.2 mg/dL   Microalb Creat Ratio 0.6 0.0 - 30.0 mg/g  Hemoglobin A1c  Result Value Ref Range   Hgb A1c MFr Bld 6.8 (H) 4.6 - 6.5 %  Comprehensive metabolic panel  Result Value Ref Range   Sodium 140 135 - 145 mEq/L   Potassium 4.4 3.5 - 5.1 mEq/L   Chloride 103 96 - 112 mEq/L   CO2 30 19 - 32 mEq/L   Glucose, Bld 121 (H) 70 - 99 mg/dL   BUN 12 6 - 23 mg/dL   Creatinine, Ser 0.97 0.40 - 1.50 mg/dL   Total Bilirubin 0.9 0.2 - 1.2 mg/dL   Alkaline Phosphatase 53 39 - 117 U/L   AST 13 0 - 37 U/L   ALT 13 0 - 53 U/L   Total Protein 7.4 6.0 - 8.3 g/dL    Albumin 4.0 3.5 - 5.2 g/dL   GFR 76.46 >60.00 mL/min   Calcium 9.8 8.4 - 10.5 mg/dL  Lipid panel  Result Value Ref Range   Cholesterol 159 0 - 200 mg/dL   Triglycerides 98.0 0.0 - 149.0 mg/dL   HDL 45.00 >39.00 mg/dL   VLDL 19.6 0.0 - 40.0 mg/dL   LDL Cholesterol 95 0 - 99 mg/dL   Total CHOL/HDL Ratio 4    NonHDL 114.14    Assessment & Plan:  This visit occurred during the SARS-CoV-2 public health emergency.  Safety protocols were in place, including screening questions prior to the visit, additional usage of staff PPE, and extensive cleaning of exam room while observing appropriate contact time as indicated for disinfecting solutions.   Problem List Items Addressed This Visit    Hypertension    Chronic, stable on current regimen - continue current regimen.       Relevant Medications   amLODipine (NORVASC) 5 MG tablet   atenolol (TENORMIN) 100 MG tablet   hydrochlorothiazide (HYDRODIURIL) 25 MG tablet   rosuvastatin (CRESTOR) 5 MG tablet (Start on 10/21/2020)   Ex-smoker    Interested in lung cancer screening program - will refer.      Relevant Orders   Ambulatory Referral for Lung Cancer Scre   Skin rash    Continue PRN triamcinolone.  Discussed topical steroid use and steroid holiday for  skin.       COPD (chronic obstructive pulmonary disease) (HCC)    Known COPD managed with PRN albuterol. Consider daily respiratory medication if worsening dyspnea noted.       Advanced care planning/counseling discussion - Primary    Advanced directives -daughtertobe HCPOA- new advanced directive packet previously provided.Asked to bring [place] copy for chart.       Controlled diabetes mellitus type 2 with complications (HCC)    Chronic. Diet controlled.       Relevant Medications   rosuvastatin (CRESTOR) 5 MG tablet (Start on 10/21/2020)   GERD (gastroesophageal reflux disease)    Continue daily PPI       Relevant Medications   omeprazole (PRILOSEC) 40 MG capsule    Hyperlipidemia associated with type 2 diabetes mellitus (HCC)    Chronic, stable on MWF Crestor 5mg  dose. The 10-year ASCVD risk score Mikey Bussing DC Brooke Bonito., et al., 2013) is: 42%   Values used to calculate the score:     Age: 46 years     Sex: Male     Is Non-Hispanic African American: Yes     Diabetic: Yes     Tobacco smoker: No     Systolic Blood Pressure: 119 mmHg     Is BP treated: Yes     HDL Cholesterol: 45 mg/dL     Total Cholesterol: 159 mg/dL       Relevant Medications   rosuvastatin (CRESTOR) 5 MG tablet (Start on 10/21/2020)   Subclinical hyperthyroidism    Levels remain normal.       Relevant Medications   atenolol (TENORMIN) 100 MG tablet   Cerumen impaction    Cerumen deep in canal. Irrigation performed today. If unsuccessful, will refer to ENT.       Relevant Orders   Ambulatory referral to ENT    Other Visit Diagnoses    Need for 23-polyvalent pneumococcal polysaccharide vaccine       Relevant Orders   Pneumococcal polysaccharide vaccine 23-valent greater than or equal to 2yo subcutaneous/IM (Completed)       Meds ordered this encounter  Medications  . triamcinolone cream (KENALOG) 0.1 %    Sig: APPLY TO AFFECTED AREA TWICE A DAY    Dispense:  80 g    Refill:  1  . amLODipine (NORVASC) 5 MG tablet    Sig: Take 1 tablet (5 mg total) by mouth daily.    Dispense:  90 tablet    Refill:  3  . atenolol (TENORMIN) 100 MG tablet    Sig: Take 1 tablet (100 mg total) by mouth daily after breakfast.    Dispense:  90 tablet    Refill:  3  . hydrochlorothiazide (HYDRODIURIL) 25 MG tablet    Sig: Take 1 tablet (25 mg total) by mouth daily.    Dispense:  90 tablet    Refill:  3  . omeprazole (PRILOSEC) 40 MG capsule    Sig: Take 1 capsule (40 mg total) by mouth daily.    Dispense:  90 capsule    Refill:  3  . rosuvastatin (CRESTOR) 5 MG tablet    Sig: Take 1 tablet (5 mg total) by mouth every Monday, Wednesday, and Friday.    Dispense:  40 tablet    Refill:  3    Orders Placed This Encounter  Procedures  . Pneumococcal polysaccharide vaccine 23-valent greater than or equal to 2yo subcutaneous/IM  . Ambulatory Referral for Lung Cancer Scre    Referral Priority:  Routine    Referral Type:   Consultation    Referral Reason:   Specialty Services Required    Number of Visits Requested:   1  . Ambulatory referral to ENT    Referral Priority:   Routine    Referral Type:   Consultation    Referral Reason:   Specialty Services Required    Requested Specialty:   Otolaryngology    Number of Visits Requested:   1    Patient instructions: We will refer you to the lung cancer screening program.  Pneumovax today and every 5 years.  If interested, check with pharmacy about new 2 shot shingles series (shingrix).  Bring [place] a copy of your updated advanced directives.  Ear irrigation today, if unsuccessful we will refer you to ENT.  Good to see you today. Return as needed or in 1 year for next wellness visit.   Follow up plan: Return in about 1 year (around 10/20/2021) for medicare wellness visit.  Ria Bush, MD

## 2020-10-20 NOTE — Assessment & Plan Note (Signed)
Cerumen deep in canal. Irrigation performed today. If unsuccessful, will refer to ENT.

## 2020-10-20 NOTE — Assessment & Plan Note (Signed)
Levels remain normal 

## 2020-10-20 NOTE — Assessment & Plan Note (Addendum)
Continue daily PPI.  

## 2020-10-20 NOTE — Assessment & Plan Note (Signed)
Chronic, stable on MWF Crestor 5mg  dose. The 10-year ASCVD risk score Mikey Bussing DC Brooke Bonito., et al., 2013) is: 42%   Values used to calculate the score:     Age: 75 years     Sex: Male     Is Non-Hispanic African American: Yes     Diabetic: Yes     Tobacco smoker: No     Systolic Blood Pressure: 952 mmHg     Is BP treated: Yes     HDL Cholesterol: 45 mg/dL     Total Cholesterol: 159 mg/dL

## 2020-10-20 NOTE — Assessment & Plan Note (Signed)
Advanced directives -daughtertobe HCPOA- new advanced directive packet previously provided.Asked to bring [place] copy for chart.

## 2020-10-20 NOTE — Assessment & Plan Note (Signed)
Interested in lung cancer screening program - will refer.

## 2020-10-20 NOTE — Assessment & Plan Note (Signed)
Known COPD managed with PRN albuterol. Consider daily respiratory medication if worsening dyspnea noted.

## 2020-10-20 NOTE — Patient Instructions (Addendum)
We will refer you to the lung cancer screening program.  Pneumovax today and every 5 years.  If interested, check with pharmacy about new 2 shot shingles series (shingrix).  Bring [place] a copy of your updated advanced directives.  Ear irrigation today, if unsuccessful we will refer you to ENT.  Good to see you today. Return as needed or in 1 year for next wellness visit.   Health Maintenance After Age 76 After age 26, you are at a higher risk for certain long-term diseases and infections as well as injuries from falls. Falls are a major cause of broken bones and head injuries in people who are older than age 54. Getting regular preventive care can help to keep you healthy and well. Preventive care includes getting regular testing and making lifestyle changes as recommended by your health care provider. Talk with your health care provider about:  Which screenings and tests you should have. A screening is a test that checks for a disease when you have no symptoms.  A diet and exercise plan that is right for you. What should I know about screenings and tests to prevent falls? Screening and testing are the best ways to find a health problem early. Early diagnosis and treatment give you the best chance of managing medical conditions that are common after age 7. Certain conditions and lifestyle choices may make you more likely to have a fall. Your health care provider may recommend:  Regular vision checks. Poor vision and conditions such as cataracts can make you more likely to have a fall. If you wear glasses, make sure to get your prescription updated if your vision changes.  Medicine review. Work with your health care provider to regularly review all of the medicines you are taking, including over-the-counter medicines. Ask your health care provider about any side effects that may make you more likely to have a fall. Tell your health care provider if any medicines that you take make you feel dizzy or  sleepy.  Osteoporosis screening. Osteoporosis is a condition that causes the bones to get weaker. This can make the bones weak and cause them to break more easily.  Blood pressure screening. Blood pressure changes and medicines to control blood pressure can make you feel dizzy.  Strength and balance checks. Your health care provider may recommend certain tests to check your strength and balance while standing, walking, or changing positions.  Foot health exam. Foot pain and numbness, as well as not wearing proper footwear, can make you more likely to have a fall.  Depression screening. You may be more likely to have a fall if you have a fear of falling, feel emotionally low, or feel unable to do activities that you used to do.  Alcohol use screening. Using too much alcohol can affect your balance and may make you more likely to have a fall. What actions can I take to lower my risk of falls? General instructions  Talk with your health care provider about your risks for falling. Tell your health care provider if: ? You fall. Be sure to tell your health care provider about all falls, even ones that seem minor. ? You feel dizzy, sleepy, or off-balance.  Take over-the-counter and prescription medicines only as told by your health care provider. These include any supplements.  Eat a healthy diet and maintain a healthy weight. A healthy diet includes low-fat dairy products, low-fat (lean) meats, and fiber from whole grains, beans, and lots of fruits and vegetables. Home  safety  Remove any tripping hazards, such as rugs, cords, and clutter.  Install safety equipment such as grab bars in bathrooms and safety rails on stairs.  Keep rooms and walkways well-lit. Activity  Follow a regular exercise program to stay fit. This will help you maintain your balance. Ask your health care provider what types of exercise are appropriate for you.  If you need a cane or walker, use it as recommended by  your health care provider.  Wear supportive shoes that have nonskid soles.   Lifestyle  Do not drink alcohol if your health care provider tells you not to drink.  If you drink alcohol, limit how much you have: ? 0-1 drink a day for women. ? 0-2 drinks a day for men.  Be aware of how much alcohol is in your drink. In the [place] one drink equals one typical bottle of beer (12 oz), one-half glass of wine (5 oz), or one shot of hard liquor (1 oz).  Do not use any products that contain nicotine or tobacco, such as cigarettes and e-cigarettes. If you need help quitting, ask your health care provider. Summary  Having a healthy lifestyle and getting preventive care can help to protect your health and wellness after age 80.  Screening and testing are the best way to find a health problem early and help you avoid having a fall. Early diagnosis and treatment give you the best chance for managing medical conditions that are more common for people who are older than age 62.  Falls are a major cause of broken bones and head injuries in people who are older than age 15. Take precautions to prevent a fall at home.  Work with your health care provider to learn what changes you can make to improve your health and wellness and to prevent falls. This information is not intended to replace advice given to you by your health care provider. Make sure you discuss any questions you have with your health care provider. Document Revised: 10/11/2018 Document Reviewed: 05/03/2017 Elsevier Patient Education  2021 [employer].

## 2020-10-21 ENCOUNTER — Other Ambulatory Visit: Payer: Medicare Other | Admitting: *Deleted

## 2020-10-21 DIAGNOSIS — Z87891 Personal history of nicotine dependence: Secondary | ICD-10-CM

## 2020-11-04 DIAGNOSIS — H6121 Impacted cerumen, right ear: Secondary | ICD-10-CM | POA: Diagnosis not present

## 2020-11-04 DIAGNOSIS — H9011 Conductive hearing loss, unilateral, right ear, with unrestricted hearing on the contralateral side: Secondary | ICD-10-CM | POA: Diagnosis not present

## 2020-12-09 ENCOUNTER — Other Ambulatory Visit: Payer: Self-pay

## 2020-12-09 ENCOUNTER — Ambulatory Visit (INDEPENDENT_AMBULATORY_CARE_PROVIDER_SITE_OTHER): Payer: Medicare Other | Admitting: Acute Care

## 2020-12-09 ENCOUNTER — Encounter: Payer: Self-pay | Admitting: Acute Care

## 2020-12-09 ENCOUNTER — Ambulatory Visit
Admission: RE | Admit: 2020-12-09 | Discharge: 2020-12-09 | Disposition: A | Discharge status: Home / Self Care | Payer: Medicare Other | Source: Ambulatory Visit | Attending: Acute Care | Admitting: Acute Care

## 2020-12-09 VITALS — BP 170/80 | HR 75 | Temp 99.0°F | Wt 197.4 lb

## 2020-12-09 DIAGNOSIS — Z87891 Personal history of nicotine dependence: Secondary | ICD-10-CM | POA: Diagnosis not present

## 2020-12-09 NOTE — Progress Notes (Signed)
Shared Decision Making Visit Lung Cancer Screening Program [phone])   Eligibility:  Age 75 y.o.  Pack Years Smoking History Calculation 25 pack year smoking history (# packs/per year x # years smoked)  Recent History of coughing up blood  no  Unexplained weight loss? no ( >Than 15 pounds within the last 6 months )  Prior History Lung / other cancer no (Diagnosis within the last 5 years already requiring surveillance chest CT Scans).  Smoking Status Former Smoker  Former Smokers: Years since quit: 8 years  Quit Date: 2014  Visit Components:  Discussion included one or more decision making aids. yes  Discussion included risk/benefits of screening. yes  Discussion included potential follow up diagnostic testing for abnormal scans. yes  Discussion included meaning and risk of over diagnosis. yes  Discussion included meaning and risk of False Positives. yes  Discussion included meaning of total radiation exposure. yes  Counseling Included:  Importance of adherence to annual lung cancer LDCT screening. yes  Impact of comorbidities on ability to participate in the program. yes  Ability and willingness to under diagnostic treatment. yes  Smoking Cessation Counseling:  Current Smokers:   Discussed importance of smoking cessation. yes  Information about tobacco cessation classes and interventions provided to patient. yes  Patient provided with "ticket" for LDCT Scan. yes  Symptomatic Patient. no  Counseling NA  Diagnosis Code: Tobacco Use Z72.0  Asymptomatic Patient yes  Counseling (Intermediate counseling: > three minutes counseling) [id number]  Former Smokers:   Discussed the importance of maintaining cigarette abstinence. yes  Diagnosis Code: Personal History of Nicotine Dependence. G40.102  Information about tobacco cessation classes and interventions provided to patient. Yes  Patient provided with "ticket" for LDCT Scan. yes  Written Order for Lung  Cancer Screening with LDCT placed in Epic. Yes (CT Chest Lung Cancer Screening Low Dose W/O CM) [id number] Z12.2-Screening of respiratory organs Z87.891-Personal history of nicotine dependence  I spent 25 minutes of face to face time with Mr. Werts discussing the risks and benefits of lung cancer screening. We viewed a power point together that explained in detail the above noted topics. We took the time to pause the power point at intervals to allow for questions to be asked and answered to ensure understanding. We discussed that he had taken the single most powerful action possible to decrease his risk of developing lung cancer when he quit smoking. I counseled him to remain smoke free, and to contact me if he ever had the desire to smoke again so that I can provide resources and tools to help support the effort to remain smoke free. We discussed the time and location of the scan, and that either  Doroteo Glassman RN or I will call with the results within  24-48 hours of receiving them. He has my card and contact information in the event He needs to speak with me, in addition to a copy of the power point we reviewed as a resource. He verbalized understanding of all of the above and had no further questions upon leaving the office.     I explained to the patient that there has been a high incidence of coronary artery disease noted on these exams. I explained that this is a non-gated exam therefore degree or severity cannot be determined. This patient is on statin therapy. I have asked the patient to follow-up with their PCP regarding any incidental finding of coronary artery disease and management with diet or medication as they feel is clinically  indicated. The patient verbalized understanding of the above and had no further questions.     Magdalen Spatz, NP 12/09/2020

## 2020-12-09 NOTE — Patient Instructions (Signed)
Thank you for participating in the [facility]. It was our pleasure to meet you today. We will call you with the results of your scan within the next few days. Your scan will be assigned a Lung RADS category score by the physicians reading the scans.  This Lung RADS score determines follow up scanning.  See below for description of categories, and follow up screening recommendations. We will be in touch to schedule your follow up screening annually or based on recommendations of our providers. We will fax a copy of your scan results to your Primary Care Physician, or the physician who referred you to the program, to ensure they have the results. Please call the office if you have any questions or concerns regarding your scanning experience or results.  Our office number is [phone]. Please speak with Denise Phelps, RN. She is our Lung Cancer Screening RN. If she is unavailable when you call, please have the office staff send her a message. She will return your call at her earliest convenience. Remember, if your scan is normal, we will scan you annually as long as you continue to meet the criteria for the program. (Age 55-77, Current smoker or smoker who has quit within the last 15 years). If you are a smoker, remember, quitting is the single most powerful action that you can take to decrease your risk of lung cancer and other pulmonary, breathing related problems. We know quitting is hard, and we are here to help.  Please let us know if there is anything we can do to help you meet your goal of quitting. If you are a former smoker, congratulations. We are proud of you! Remain smoke free! Remember you can refer friends or family members through the number above.  We will screen them to make sure they meet criteria for the program. Thank you for helping us take better care of you by participating in Lung Screening.  Lung RADS Categories:  Lung RADS 1: no nodules  or definitely non-concerning nodules.  Recommendation is for a repeat annual scan in 12 months.  Lung RADS 2:  nodules that are non-concerning in appearance and behavior with a very low likelihood of becoming an active cancer. Recommendation is for a repeat annual scan in 12 months.  Lung RADS 3: nodules that are probably non-concerning , includes nodules with a low likelihood of becoming an active cancer.  Recommendation is for a 6-month repeat screening scan. Often noted after an upper respiratory illness. We will be in touch to make sure you have no questions, and to schedule your 6-month scan.  Lung RADS 4 A: nodules with concerning findings, recommendation is most often for a follow up scan in 3 months or additional testing based on our provider's assessment of the scan. We will be in touch to make sure you have no questions and to schedule the recommended 3 month follow up scan.  Lung RADS 4 B:  indicates findings that are concerning. We will be in touch with you to schedule additional diagnostic testing based on our provider's  assessment of the scan.   

## 2020-12-17 NOTE — Progress Notes (Signed)
Please call patient and let them  know their  low dose Ct was read as a Lung RADS 1, negative study: no nodules or definitely benign nodules. Radiology recommendation is for a repeat LDCT in 12 months. .Please let them  know we will order and schedule their  annual screening scan for 12/2021. Please let them  know there was notation of CAD on their  scan.  Please remind the patient  that this is a non-gated exam therefore degree or severity of disease  cannot be determined. Please have them  follow up with their PCP regarding potential risk factor modification, dietary therapy or pharmacologic therapy if clinically indicated. Pt.  is  currently on statin therapy. Please place order for annual  screening scan for  12/2021 and fax results to PCP. Thanks so much.

## 2020-12-21 ENCOUNTER — Other Ambulatory Visit: Payer: Self-pay | Admitting: *Deleted

## 2020-12-21 DIAGNOSIS — Z87891 Personal history of nicotine dependence: Secondary | ICD-10-CM

## 2021-01-17 DIAGNOSIS — U071 COVID-19: Secondary | ICD-10-CM | POA: Diagnosis not present

## 2021-03-09 DIAGNOSIS — U071 COVID-19: Secondary | ICD-10-CM | POA: Diagnosis not present

## 2021-04-08 DIAGNOSIS — Z23 Encounter for immunization: Secondary | ICD-10-CM | POA: Diagnosis not present

## 2021-04-26 ENCOUNTER — Other Ambulatory Visit: Payer: Self-pay | Admitting: Family Medicine

## 2021-04-27 NOTE — Telephone Encounter (Signed)
Refill request triamcinolone Last office visit 10/20/20 Last refill 10/20/20 80 G/1

## 2021-09-11 ENCOUNTER — Other Ambulatory Visit: Payer: Self-pay | Admitting: Family Medicine

## 2021-09-13 NOTE — Telephone Encounter (Signed)
Last refill Viagra ?Last refill 08/13/20 #5/11 ?Last office visit 10/20/20 ?Upcoming appointment 10/22/21 ?

## 2021-10-01 ENCOUNTER — Emergency Department (HOSPITAL_BASED_OUTPATIENT_CLINIC_OR_DEPARTMENT_OTHER): Payer: Medicare Other

## 2021-10-01 ENCOUNTER — Emergency Department (HOSPITAL_COMMUNITY)
Admission: EM | Admit: 2021-10-01 | Discharge: 2021-10-01 | Disposition: A | Discharge status: Home / Self Care | Payer: Medicare Other | Attending: Emergency Medicine | Admitting: Emergency Medicine

## 2021-10-01 ENCOUNTER — Emergency Department (HOSPITAL_COMMUNITY): Payer: Medicare Other

## 2021-10-01 ENCOUNTER — Encounter (HOSPITAL_COMMUNITY): Payer: Self-pay | Admitting: Emergency Medicine

## 2021-10-01 DIAGNOSIS — M1611 Unilateral primary osteoarthritis, right hip: Secondary | ICD-10-CM | POA: Insufficient documentation

## 2021-10-01 DIAGNOSIS — Z79899 Other long term (current) drug therapy: Secondary | ICD-10-CM | POA: Diagnosis not present

## 2021-10-01 DIAGNOSIS — M79604 Pain in right leg: Secondary | ICD-10-CM

## 2021-10-01 DIAGNOSIS — Z7982 Long term (current) use of aspirin: Secondary | ICD-10-CM | POA: Diagnosis not present

## 2021-10-01 DIAGNOSIS — M25551 Pain in right hip: Secondary | ICD-10-CM | POA: Diagnosis present

## 2021-10-01 DIAGNOSIS — M1711 Unilateral primary osteoarthritis, right knee: Secondary | ICD-10-CM | POA: Diagnosis not present

## 2021-10-01 NOTE — ED Provider Notes (Signed)
?[facility] ?Provider Note ? ? ?CSN: [id number] ?Arrival date & time: 10/01/21  1043 ? ?  ? ?History ? ?Chief Complaint  ?Patient presents with  ? Leg Pain  ? ? ?Gregory Abbott is a 76 y.o. male. ? ?Patient complains of pain in his right leg patient reports that pain occurs in his hip and his anterior thigh and in the front of his right shin.  Reports he has pain in different areas at different times patient reports he used to walk a mile a day but is now having discomfort with walking.  Patient denies any injury patient reports he has not had any swelling he denies any numbness or tingling.  Patient reports he has not had any problems with his feet.  Patient denies any swelling he has not experienced any shortness of breath patient reports he has not had any blood clots in the past he has not had any recent travel no known cancers no recent immobilization. ? ?The history is provided by the patient. No language interpreter was used.  ?Leg Pain ?Location:  Hip and leg ?Time since incident:  2 months ?Hip location:  R hip ?Leg location:  R leg and R lower leg ?Pain details:  ?  Quality:  Aching ?  Radiates to:  Does not radiate ?  Severity:  Moderate ?  Onset quality:  Gradual ?  Duration:  2 months ?  Timing:  Intermittent ?  Progression:  Waxing and waning ?Chronicity:  New ?Dislocation: no   ?Tetanus status:  Unknown ?Prior injury to area:  No ? ?  ? ?Home Medications ?Prior to Admission medications   ?Medication Sig Start Date End Date Taking? Authorizing Provider  ?albuterol (VENTOLIN HFA) 108 (90 Base) MCG/ACT inhaler Inhale 2 puffs into the lungs every 6 (six) hours as needed for wheezing or shortness of breath. 12/26/19   Ria Bush, MD  ?amLODipine (NORVASC) 5 MG tablet Take 1 tablet (5 mg total) by mouth daily. 10/20/20   Ria Bush, MD  ?aspirin EC 81 MG tablet Take 81 mg by mouth every other day.    [provider]  ?atenolol (TENORMIN) 100 MG  tablet Take 1 tablet (100 mg total) by mouth daily after breakfast. 10/20/20   Ria Bush, MD  ?Cholecalciferol (VITAMIN D3) 25 MCG (1000 UT) CAPS Take 1 capsule (1,000 Units total) by mouth daily. ?Patient taking differently: Take 1 capsule by mouth daily. Takes about twice a month 06/26/19   Ria Bush, MD  ?hydrochlorothiazide (HYDRODIURIL) 25 MG tablet Take 1 tablet (25 mg total) by mouth daily. 10/20/20   Ria Bush, MD  ?loratadine (CLARITIN) 10 MG tablet Take 1 tablet (10 mg total) by mouth daily. ?Patient taking differently: Take 10 mg by mouth daily. As needed 05/10/16   Ria Bush, MD  ?omeprazole (PRILOSEC) 40 MG capsule Take 1 capsule (40 mg total) by mouth daily. 10/20/20   Ria Bush, MD  ?rosuvastatin (CRESTOR) 5 MG tablet Take 1 tablet (5 mg total) by mouth every Monday, Wednesday, and Friday. 10/21/20   Ria Bush, MD  ?sildenafil (VIAGRA) 100 MG tablet TAKE 1/2 - 1 TABLETS (50-100 MG TOTAL) BY MOUTH DAILY AS NEEDED FOR ERECTILE DYSFUNCTION. 09/15/21   Ria Bush, MD  ?triamcinolone cream (KENALOG) 0.1 % APPLY TO AFFECTED AREA TWICE A DAY 04/28/21   Ria Bush, MD  ?   ? ?Allergies    ?Penicillins, Azithromycin, and Losartan   ? ?Review of Systems   ?Review  of Systems  ?All other systems reviewed and are negative. ? ?Physical Exam ?Updated Vital Signs ?BP (!) 161/87 (BP Location: Right Arm)   Pulse 71   Temp 97.9 ?F (36.6 ?C) (Oral)   Resp 14   SpO2 97%  ?Physical Exam ?Vitals and nursing note reviewed.  ?Constitutional:   ?   Appearance: He is well-developed.  ?HENT:  ?   Head: Normocephalic.  ?Cardiovascular:  ?   Rate and Rhythm: Normal rate and regular rhythm.  ?Pulmonary:  ?   Effort: Pulmonary effort is normal.  ?Abdominal:  ?   General: There is no distension.  ?Musculoskeletal:     ?   General: Tenderness present. No swelling. Normal range of motion.  ?   Cervical back: Normal range of motion.  ?   Comments: No swelling,  no deformity,   from  nv and ns intact     ?Skin: ?   General: Skin is warm.  ?Neurological:  ?   Mental Status: He is alert and oriented to person, place, and time.  ?Psychiatric:     ?   Mood and Affect: Mood normal.  ? ? ?ED Results / Procedures / Treatments   ?Labs ?(all labs ordered are listed, but only abnormal results are displayed) ?Labs Reviewed - No data to display ? ?EKG ?None ? ?Radiology ?DG Tibia/Fibula Right ? ?Result Date: 10/01/2021 ?CLINICAL DATA:  Pain and stiffness in right leg for 3 months, no injury EXAM: RIGHT TIBIA AND FIBULA - 2 VIEW; RIGHT FEMUR 2 VIEWS COMPARISON:  None. FINDINGS: No acute fracture or dislocation. Severe degenerative changes in the right, with significant joint space loss, subchondral sclerosis, and osteophyte formation. Less significant degenerative changes in the knee, with chondrocalcinosis and mild spurring. Vascular calcifications. Soft tissues are otherwise unremarkable. IMPRESSION: No acute osseous abnormality. Degenerative changes, which are severe in the right hip. Electronically Signed   By: Merilyn Baba M.D.   On: 10/01/2021 12:30  ? ?DG Femur Min 2 Views Right ? ?Result Date: 10/01/2021 ?CLINICAL DATA:  Pain and stiffness in right leg for 3 months, no injury EXAM: RIGHT TIBIA AND FIBULA - 2 VIEW; RIGHT FEMUR 2 VIEWS COMPARISON:  None. FINDINGS: No acute fracture or dislocation. Severe degenerative changes in the right, with significant joint space loss, subchondral sclerosis, and osteophyte formation. Less significant degenerative changes in the knee, with chondrocalcinosis and mild spurring. Vascular calcifications. Soft tissues are otherwise unremarkable. IMPRESSION: No acute osseous abnormality. Degenerative changes, which are severe in the right hip. Electronically Signed   By: Merilyn Baba M.D.   On: 10/01/2021 12:30  ? ?VAS [place] LOWER EXTREMITY VENOUS (DVT) (7a-7p) ? ?Result Date: 10/01/2021 ? Lower Venous DVT Study Patient Name:  Gregory Abbott  Date of Exam:   10/01/2021  Medical Rec #: [id number]       Accession #:    [id number] Date of Birth: 24-May-1946      Patient Gender: M Patient Age:   42 years Exam Location:  [facility] Procedure:      VAS [place] LOWER EXTREMITY VENOUS (DVT) Referring Phys: Merleen Nicely FORD --------------------------------------------------------------------------------  Indications: Pain.  Comparison Study: No prior studies. Performing Technologist: Darlin Coco RDMS, RVT  Examination Guidelines: A complete evaluation includes B-mode imaging, spectral Doppler, color Doppler, and power Doppler as needed of all accessible portions of each vessel. Bilateral testing is considered an integral part of a complete examination. Limited examinations for reoccurring indications may be performed as noted. The reflux portion of  the exam is performed with the patient in reverse Trendelenburg.  +---------+---------------+---------+-----------+----------+--------------+ RIGHT    CompressibilityPhasicitySpontaneityPropertiesThrombus Aging +---------+---------------+---------+-----------+----------+--------------+ CFV      Full           Yes      Yes                                 +---------+---------------+---------+-----------+----------+--------------+ SFJ      Full                                                        +---------+---------------+---------+-----------+----------+--------------+ FV Prox  Full                                                        +---------+---------------+---------+-----------+----------+--------------+ FV Mid   Full                                                        +---------+---------------+---------+-----------+----------+--------------+ FV DistalFull                                                        +---------+---------------+---------+-----------+----------+--------------+ PFV      Full                                                         +---------+---------------+---------+-----------+----------+--------------+ POP      Full           Yes      Yes                                 +---------+---------------+---------+-----------+----------+--------------+ PTV      Full                                                        +----

## 2021-10-01 NOTE — ED Triage Notes (Signed)
Patient complains of intermittent right leg pain that started a few months ago. Pain occurs in different places during different occurrences, between the right lower back, right thigh, and right lower leg. Pain is brought on by and exacerbated by strenuous movement. Patient is alert, oriented, ambulatory, and in no apparent distress at this time. ?

## 2021-10-01 NOTE — Progress Notes (Signed)
Lower extremity venous right study completed. ? ?Preliminary results relayed to [place], [place], [place]. ? ?See CV Proc for preliminary results report.  ? ?Darlin Coco, RDMS, RVT ? ?

## 2021-10-01 NOTE — Discharge Instructions (Addendum)
Return if any problems.  Schedule to see the Orthopaedist for evaltuion . Tylenol for pain ?

## 2021-10-01 NOTE — ED Provider Triage Note (Signed)
Emergency Medicine Provider Triage Evaluation Note ? ?Gregory Abbott , a 76 y.o. male  was evaluated in triage.  Pt complains of right leg pain.  Patient reports for the past few months he has been having intermittent pains in his right leg.  He reports sometimes the pain is in the hip and sometimes is down in the lower leg.  He denies any falls or injury.  Pain seems to be worse with increased activity or when he is out working in the yard.  He reports currently he is not having any focal pain but earlier he was having pain down into his lower leg in the shin and calf.  He has had some swelling over his knee once before but none recently, no fevers or chills.  No associated back pain, numbness or weakness, patient reports he is concerned about a blood clot. ? ?Review of Systems  ?Positive: Leg pain ?Negative: Chest pain, shortness of breath, swelling, redness ? ?Physical Exam  ?BP (!) 170/93 (BP Location: Right Arm)   Pulse 74   Temp 97.8 ?F (36.6 ?C) (Oral)   Resp 14   SpO2 96%  ?Gen:   Awake, no distress   ?Resp:  Normal effort  ?MSK:   Moves extremities without difficulty, no skin changes, bony tenderness or swelling over the right leg, distal pulses 2+, normal range of motion of all joints. ?Other:   ? ?Medical Decision Making  ?Medically screening exam initiated at 10:54 AM.  Appropriate orders placed.  SHERMON BOZZI was informed that the remainder of the evaluation will be completed by another provider, this initial triage assessment does not replace that evaluation, and the importance of remaining in the ED until their evaluation is complete. ? ? ?  ?Jacqlyn Larsen, PA-C ?10/01/21 1124 ? ?

## 2021-10-14 ENCOUNTER — Ambulatory Visit: Payer: Medicare Other

## 2021-10-14 ENCOUNTER — Other Ambulatory Visit: Payer: Self-pay | Admitting: Family Medicine

## 2021-10-14 DIAGNOSIS — E1169 Type 2 diabetes mellitus with other specified complication: Secondary | ICD-10-CM

## 2021-10-14 DIAGNOSIS — E0590 Thyrotoxicosis, unspecified without thyrotoxic crisis or storm: Secondary | ICD-10-CM

## 2021-10-14 DIAGNOSIS — E118 Type 2 diabetes mellitus with unspecified complications: Secondary | ICD-10-CM

## 2021-10-14 DIAGNOSIS — Z125 Encounter for screening for malignant neoplasm of prostate: Secondary | ICD-10-CM

## 2021-10-15 ENCOUNTER — Other Ambulatory Visit (INDEPENDENT_AMBULATORY_CARE_PROVIDER_SITE_OTHER): Payer: Medicare Other

## 2021-10-15 DIAGNOSIS — E785 Hyperlipidemia, unspecified: Secondary | ICD-10-CM

## 2021-10-15 DIAGNOSIS — E118 Type 2 diabetes mellitus with unspecified complications: Secondary | ICD-10-CM

## 2021-10-15 DIAGNOSIS — E0590 Thyrotoxicosis, unspecified without thyrotoxic crisis or storm: Secondary | ICD-10-CM

## 2021-10-15 DIAGNOSIS — Z125 Encounter for screening for malignant neoplasm of prostate: Secondary | ICD-10-CM | POA: Diagnosis not present

## 2021-10-15 DIAGNOSIS — E1169 Type 2 diabetes mellitus with other specified complication: Secondary | ICD-10-CM

## 2021-10-15 LAB — LIPID PANEL
Cholesterol: 174 mg/dL (ref 0–200)
HDL: 46.3 mg/dL (ref >39.00)
LDL Cholesterol: 100 mg/dL — ABNORMAL HIGH (ref 0–99)
NonHDL: 127.95
Total CHOL/HDL Ratio: 4
Triglycerides: 142 mg/dL (ref 0.0–149.0)
VLDL: 28.4 mg/dL (ref 0.0–40.0)

## 2021-10-15 LAB — COMPREHENSIVE METABOLIC PANEL
ALT: 13 U/L (ref 0–53)
AST: 12 U/L (ref 0–37)
Albumin: 4.1 g/dL (ref 3.5–5.2)
Alkaline Phosphatase: 56 U/L (ref 39–117)
BUN: 14 mg/dL (ref 6–23)
CO2: 29 mEq/L (ref 19–32)
Calcium: 9.5 mg/dL (ref 8.4–10.5)
Chloride: 101 mEq/L (ref 96–112)
Creatinine, Ser: 0.95 mg/dL (ref 0.40–1.50)
GFR: 77.85 mL/min (ref >60.00)
Glucose, Bld: 145 mg/dL — ABNORMAL HIGH (ref 70–99)
Potassium: 4.5 mEq/L (ref 3.5–5.1)
Sodium: 137 mEq/L (ref 135–145)
Total Bilirubin: 1 mg/dL (ref 0.2–1.2)
Total Protein: 7 g/dL (ref 6.0–8.3)

## 2021-10-15 LAB — MICROALBUMIN / CREATININE URINE RATIO
Creatinine,U: 159.9 mg/dL
Microalb Creat Ratio: 0.5 mg/g (ref 0.0–30.0)
Microalb, Ur: 0.8 mg/dL (ref 0.0–1.9)

## 2021-10-15 LAB — PSA, MEDICARE: PSA: 1.08 ng/ml (ref 0.10–4.00)

## 2021-10-15 LAB — TSH: TSH: 1.74 u[IU]/mL (ref 0.35–5.50)

## 2021-10-15 LAB — T4, FREE: Free T4: 1.01 ng/dL (ref 0.60–1.60)

## 2021-10-15 LAB — HEMOGLOBIN A1C: Hgb A1c MFr Bld: 7.5 % — ABNORMAL HIGH (ref 4.6–6.5)

## 2021-10-18 ENCOUNTER — Ambulatory Visit (INDEPENDENT_AMBULATORY_CARE_PROVIDER_SITE_OTHER): Payer: Medicare Other

## 2021-10-18 VITALS — Ht 69.0 in | Wt 197.0 lb

## 2021-10-18 DIAGNOSIS — Z0000 Encounter for general adult medical examination without abnormal findings: Secondary | ICD-10-CM

## 2021-10-18 NOTE — Progress Notes (Signed)
? ?Subjective:  ? Gregory Abbott is a 76 y.o. male who presents for Medicare Annual/Subsequent preventive examination. ?Virtual Visit via Telephone Note ? ?I connected with  [name] on 10/18/21 at 11:15 AM EDT by telephone and verified that I am speaking with the correct person using two identifiers. ? ?Location: ?Patient: HOME ?Provider: [facility] ?Persons participating in the virtual visit: patient/Nurse Health Advisor ?  ?I discussed the limitations, risks, security and privacy concerns of performing an evaluation and management service by telephone and the availability of in person appointments. The patient expressed understanding and agreed to proceed. ? ?Interactive audio and video telecommunications were attempted between this nurse and patient, however failed, due to patient having technical difficulties OR patient did not have access to video capability.  We continued and completed visit with audio only. ? ?Some vital signs may be absent or patient reported.  ? ?Chriss Driver, LPN ? ?Review of Systems    ? ?Cardiac Risk Factors include: advanced age (>46mn, >>52women);dyslipidemia;hypertension;male gender;sedentary lifestyle;Other (see comment), Risk factor comments: COPD ? ?   ?Objective:  ?  ?Today's Vitals  ? 10/18/21 1117 10/18/21 1118  ?Weight: 197 lb (89.4 kg)   ?Height: '5\' 9"'$  (1.753 m)   ?PainSc:  5   ? ?Body mass index is 29.09 kg/m?. ? ? ?  10/18/2021  ? 11:25 AM 10/13/2020  ? 10:30 AM 06/20/2019  ?  2:45 PM 06/15/2018  ?  2:08 PM 05/02/2016  ?  9:40 AM 11/07/2013  ?  4:50 AM  ?Advanced Directives  ?Does Patient Have a Medical Advance Directive? Yes Yes Yes Yes Yes Patient does not have advance directive  ?Type of AParamed[place]of [place]St. [place]Living will [place]Living will [place]Living will [place]Living will [place]ascaLiving will   ?Does patient want to make changes to medical advance  directive?     No - Patient declined   ?Copy of [place] Chart? No - copy requested No - copy requested No - copy requested No - copy requested No - copy requested   ?Pre-existing out of facility DNR order (yellow form or pink MOST form)      No  ? ? ?Current Medications (verified) ?Outpatient Encounter Medications as of 10/18/2021  ?Medication Sig  ? albuterol (VENTOLIN HFA) 108 (90 Base) MCG/ACT inhaler Inhale 2 puffs into the lungs every 6 (six) hours as needed for wheezing or shortness of breath.  ? amLODipine (NORVASC) 5 MG tablet Take 1 tablet (5 mg total) by mouth daily.  ? aspirin EC 81 MG tablet Take 81 mg by mouth every other day.  ? atenolol (TENORMIN) 100 MG tablet Take 1 tablet (100 mg total) by mouth daily after breakfast.  ? Cholecalciferol (VITAMIN D3) 25 MCG (1000 UT) CAPS Take 1 capsule (1,000 Units total) by mouth daily. (Patient taking differently: Take 1 capsule by mouth daily. Takes about twice a month)  ? hydrochlorothiazide (HYDRODIURIL) 25 MG tablet Take 1 tablet (25 mg total) by mouth daily.  ? loratadine (CLARITIN) 10 MG tablet Take 1 tablet (10 mg total) by mouth daily. (Patient taking differently: Take 10 mg by mouth daily. As needed)  ? omeprazole (PRILOSEC) 40 MG capsule Take 1 capsule (40 mg total) by mouth daily.  ? rosuvastatin (CRESTOR) 5 MG tablet Take 1 tablet (5 mg total) by mouth every Monday, Wednesday, and Friday.  ? SHINGRIX injection   ? sildenafil (  VIAGRA) 100 MG tablet TAKE 1/2 - 1 TABLETS (50-100 MG TOTAL) BY MOUTH DAILY AS NEEDED FOR ERECTILE DYSFUNCTION.  ? triamcinolone cream (KENALOG) 0.1 % APPLY TO AFFECTED AREA TWICE A DAY  ? ?No facility-administered encounter medications on file as of 10/18/2021.  ? ? ?Allergies (verified) ?Penicillins, Azithromycin, and Losartan  ? ?History: ?Past Medical History:  ?Diagnosis Date  ? Blood transfusion without reported diagnosis   ? BPH (benign prostatic hypertrophy)   ? Cataract   ? Childhood asthma   ? COPD  (chronic obstructive pulmonary disease) ([place])   ? Eczema   ? Ex-smoker   ? 39 PY hx  ? GERD (gastroesophageal reflux disease)   ? History of chicken pox   ? Hypertension   ? Lower back pain   ? h/o herniated and bulging discs lower back  ? Osteoarthritis of neck   ? Personal history of colonic polyps 05/07/2013  ? resected not retrieved  ? PUD (peptic ulcer disease)   ? S/P splenectomy   ? partial  ? Shortness of breath   ? ?Past Surgical History:  ?Procedure Laterality Date  ? AAA screen  04/2013  ? neg for AAA  ? CATARACT EXTRACTION Left 05/2013  ? [place]  ? CATARACT EXTRACTION W/PHACO Right 10/06/2015  ? Procedure: CATARACT EXTRACTION PHACO AND INTRAOCULAR LENS PLACEMENT (IOC);  Surgeon: Birder Robson, MD;  Location: [facility] ORS;  Service: Ophthalmology;  Laterality: Right;  [place] ?AP% ?CDE ?fluid pack lot 3 [id number] H exp 04/02/2017  ? COLONOSCOPY  2009  ? COLONOSCOPY  05/2013  ? 1 polyp, int/ext hemorrhoids Carlean Purl)  ? COLONOSCOPY  08/2018  ? Diminutive adenoma, no f/u planned Carlean Purl)  ? KNEE ARTHROSCOPY    ? lower extremity arterial doppler  2013  ? WNL  ? SPLENECTOMY, PARTIAL  1998  ? fall down stairs  ? ?Family History  ?Problem Relation Age of Onset  ? Diabetes Mother   ? Hypertension Mother   ? Hyperlipidemia Mother   ? Heart disease Mother   ? Stroke Father   ? Colon cancer Neg Hx   ? Rectal cancer Neg Hx   ? Stomach cancer Neg Hx   ? Esophageal cancer Neg Hx   ? ?Social History  ? ?Socioeconomic History  ? Marital status: Married  ?  Spouse name: Not on file  ? Number of children: 2  ? Years of education: Not on file  ? Highest education level: Not on file  ?Occupational History  ? Occupation: Retired  ?Tobacco Use  ? Smoking status: Former  ?  Packs/day: 0.50  ?  Years: 49.00  ?  Pack years: 24.50  ?  Types: Cigarettes  ?  Quit date: 11/06/2013  ?  Years since quitting: 7.9  ? Smokeless tobacco: Never  ?Vaping Use  ? Vaping Use: Never used  ?Substance and Sexual Activity  ? Alcohol use: Yes  ?   Alcohol/week: 2.0 standard drinks  ?  Types: 1 Cans of beer, 1 Shots of liquor per week  ?  Comment: occasional  ? Drug use: No  ? Sexual activity: Yes  ?Other Topics Concern  ? Not on file  ?Social History Narrative  ? Lives with wife, no pets  ? Occupation: retired, Engineer, mining  ? Edu: Degree  ? Activity: housework, no regular exercise  ? Diet: good water, fruits/vegetables daily  ? ?Social Determinants of Health  ? ?Financial Resource Strain: Low Risk   ? Difficulty of Paying Living Expenses: Not hard at all  ?  Food Insecurity: No Food Insecurity  ? Worried About Charity fundraiser in the Last Year: Never true  ? Ran Out of Food in the Last Year: Never true  ?Transportation Needs: No Transportation Needs  ? Lack of Transportation (Medical): No  ? Lack of Transportation (Non-Medical): No  ?Physical Activity: Sufficiently Active  ? Days of Exercise per Week: 5 days  ? Minutes of Exercise per Session: 30 min  ?Stress: No Stress Concern Present  ? Feeling of Stress : Not at all  ?Social Connections: Socially Integrated  ? Frequency of Communication with Friends and Family: More than three times a week  ? Frequency of Social Gatherings with Friends and Family: More than three times a week  ? Attends Religious Services: More than 4 times per year  ? Active Member of Clubs or Organizations: Yes  ? Attends Archivist Meetings: More than 4 times per year  ? Marital Status: Married  ? ? ?Tobacco Counseling ?Counseling given: Not Answered ? ? ?Clinical Intake: ? ?Pre-visit preparation completed: Yes ? ?Pain : 0-10 ?Pain Score: 5  ?Pain Type: Chronic pain ?Pain Location: Leg ?Pain Descriptors / Indicators: Aching ?Pain Onset: 1 to 4 weeks ago ?Pain Frequency: Intermittent ? ?  ? ?BMI - recorded: 29.09 ?Nutritional Status: BMI 25 -29 Overweight ?Nutritional Risks: None ?Diabetes: No ? ?How often do you need to have someone help you when you read instructions, pamphlets, or other written materials from your doctor or  pharmacy?: 1 - Never ? ?Diabetic? PT STATES HE IS NOT DIABETIC. DOES NOT CHECK GLUCOSE AT HOME. ? ?Interpreter Needed?: No ? ?Information entered by :: MJ Courtnei Ruddell, LPN ? ? ?Activities of Daily Living ? ?  10/18/2021

## 2021-10-18 NOTE — Patient Instructions (Signed)
Gregory Abbott , ?Thank you for taking time to come for your Medicare Wellness Visit. I appreciate your ongoing commitment to your health goals. Please review the following plan we discussed and let me know if I can assist you in the future.  ? ?Screening recommendations/referrals: ?Colonoscopy: DONE 08/15/2018 Repeat in 5 years ? ?Recommended yearly ophthalmology/optometry visit for glaucoma screening and checkup ?Recommended yearly dental visit for hygiene and checkup ? ?Vaccinations: ?Influenza vaccine: DONE 04/08/2021 Repeat annually ? ?Pneumococcal vaccine: DONE 11/14/2013, 04/03/2015 AND 10/20/2020 ?Tdap vaccine: DONE 01/31/2011 Repeat in 10 years ? ?Shingles vaccine: DONE 04/26/2021 SECOND DOSE DUE AT YOUR CONVENIENCE.   ?Covid-19: DONE 07/24/2020, 09/16/2019, 08/16/2019 ? ?Advanced directives: Please bring a copy of your health care power of attorney and living will to the office to be added to your chart at your convenience. ? ? ?Conditions/risks identified: Aim for 30 minutes of exercise or brisk walking, 6-8 glasses of water, and 5 servings of fruits and vegetables each day. ? ? ?Next appointment: Follow up in one year for your annual wellness visit. 2024. ? ?Preventive Care 13 Years and Older, Male ? ?Preventive care refers to lifestyle choices and visits with your health care provider that can promote health and wellness. ?What does preventive care include? ?A yearly physical exam. This is also called an annual well check. ?Dental exams once or twice a year. ?Routine eye exams. Ask your health care provider how often you should have your eyes checked. ?Personal lifestyle choices, including: ?Daily care of your teeth and gums. ?Regular physical activity. ?Eating a healthy diet. ?Avoiding tobacco and drug use. ?Limiting alcohol use. ?Practicing safe sex. ?Taking low doses of aspirin every day. ?Taking vitamin and mineral supplements as recommended by your health care provider. ?What happens during an annual well  check? ?The services and screenings done by your health care provider during your annual well check will depend on your age, overall health, lifestyle risk factors, and family history of disease. ?Counseling  ?Your health care provider may ask you questions about your: ?Alcohol use. ?Tobacco use. ?Drug use. ?Emotional well-being. ?Home and relationship well-being. ?Sexual activity. ?Eating habits. ?History of falls. ?Memory and ability to understand (cognition). ?Work and work Statistician. ?Screening  ?You may have the following tests or measurements: ?Height, weight, and BMI. ?Blood pressure. ?Lipid and cholesterol levels. These may be checked every 5 years, or more frequently if you are over 92 years old. ?Skin check. ?Lung cancer screening. You may have this screening every year starting at age 60 if you have a 30-pack-year history of smoking and currently smoke or have quit within the past 15 years. ?Fecal occult blood test (FOBT) of the stool. You may have this test every year starting at age 12. ?Flexible sigmoidoscopy or colonoscopy. You may have a sigmoidoscopy every 5 years or a colonoscopy every 10 years starting at age 69. ?Prostate cancer screening. Recommendations will vary depending on your family history and other risks. ?Hepatitis C blood test. ?Hepatitis B blood test. ?Sexually transmitted disease (STD) testing. ?Diabetes screening. This is done by checking your blood sugar (glucose) after you have not eaten for a while (fasting). You may have this done every 1-3 years. ?Abdominal aortic aneurysm (AAA) screening. You may need this if you are a current or former smoker. ?Osteoporosis. You may be screened starting at age 1 if you are at high risk. ?Talk with your health care provider about your test results, treatment options, and if necessary, the need for more tests. ?Vaccines  ?  Your health care provider may recommend certain vaccines, such as: ?Influenza vaccine. This is recommended every  year. ?Tetanus, diphtheria, and acellular pertussis (Tdap, Td) vaccine. You may need a Td booster every 10 years. ?Zoster vaccine. You may need this after age 42. ?Pneumococcal 13-valent conjugate (PCV13) vaccine. One dose is recommended after age 22. ?Pneumococcal polysaccharide (PPSV23) vaccine. One dose is recommended after age 59. ?Talk to your health care provider about which screenings and vaccines you need and how often you need them. ?This information is not intended to replace advice given to you by your health care provider. Make sure you discuss any questions you have with your health care provider. ?Document Released: 07/17/2015 Document Revised: 03/09/2016 Document Reviewed: 04/21/2015 ?Elsevier Interactive Patient Education ? 2017 [place]. ? ?Fall Prevention in the Home ?Falls can cause injuries. They can happen to people of all ages. There are many things you can do to make your home safe and to help prevent falls. ?What can I do on the outside of my home? ?Regularly fix the edges of walkways and driveways and fix any cracks. ?Remove anything that might make you trip as you walk through a door, such as a raised step or threshold. ?Trim any bushes or trees on the path to your home. ?Use bright outdoor lighting. ?Clear any walking paths of anything that might make someone trip, such as rocks or tools. ?Regularly check to see if handrails are loose or broken. Make sure that both sides of any steps have handrails. ?Any raised decks and porches should have guardrails on the edges. ?Have any leaves, snow, or ice cleared regularly. ?Use sand or salt on walking paths during winter. ?Clean up any spills in your garage right away. This includes oil or grease spills. ?What can I do in the bathroom? ?Use night lights. ?Install grab bars by the toilet and in the tub and shower. Do not use towel bars as grab bars. ?Use non-skid mats or decals in the tub or shower. ?If you need to sit down in the shower, use a  plastic, non-slip stool. ?Keep the floor dry. Clean up any water that spills on the floor as soon as it happens. ?Remove soap buildup in the tub or shower regularly. ?Attach bath mats securely with double-sided non-slip rug tape. ?Do not have throw rugs and other things on the floor that can make you trip. ?What can I do in the bedroom? ?Use night lights. ?Make sure that you have a light by your bed that is easy to reach. ?Do not use any sheets or blankets that are too big for your bed. They should not hang down onto the floor. ?Have a firm chair that has side arms. You can use this for support while you get dressed. ?Do not have throw rugs and other things on the floor that can make you trip. ?What can I do in the kitchen? ?Clean up any spills right away. ?Avoid walking on wet floors. ?Keep items that you use a lot in easy-to-reach places. ?If you need to reach something above you, use a strong step stool that has a grab bar. ?Keep electrical cords out of the way. ?Do not use floor polish or wax that makes floors slippery. If you must use wax, use non-skid floor wax. ?Do not have throw rugs and other things on the floor that can make you trip. ?What can I do with my stairs? ?Do not leave any items on the stairs. ?Make sure that there are  handrails on both sides of the stairs and use them. Fix handrails that are broken or loose. Make sure that handrails are as long as the stairways. ?Check any carpeting to make sure that it is firmly attached to the stairs. Fix any carpet that is loose or worn. ?Avoid having throw rugs at the top or bottom of the stairs. If you do have throw rugs, attach them to the floor with carpet tape. ?Make sure that you have a light switch at the top of the stairs and the bottom of the stairs. If you do not have them, ask someone to add them for you. ?What else can I do to help prevent falls? ?Wear shoes that: ?Do not have high heels. ?Have rubber bottoms. ?Are comfortable and fit you  well. ?Are closed at the toe. Do not wear sandals. ?If you use a stepladder: ?Make sure that it is fully opened. Do not climb a closed stepladder. ?Make sure that both sides of the stepladder are locked into place. ?Ask s

## 2021-10-22 ENCOUNTER — Encounter: Payer: Self-pay | Admitting: Family Medicine

## 2021-10-22 ENCOUNTER — Ambulatory Visit (INDEPENDENT_AMBULATORY_CARE_PROVIDER_SITE_OTHER): Payer: Medicare Other | Admitting: Family Medicine

## 2021-10-22 VITALS — BP 154/80 | HR 76 | Temp 98.1°F | Ht 69.0 in | Wt 199.4 lb

## 2021-10-22 DIAGNOSIS — E1169 Type 2 diabetes mellitus with other specified complication: Secondary | ICD-10-CM | POA: Diagnosis not present

## 2021-10-22 DIAGNOSIS — J449 Chronic obstructive pulmonary disease, unspecified: Secondary | ICD-10-CM | POA: Diagnosis not present

## 2021-10-22 DIAGNOSIS — E785 Hyperlipidemia, unspecified: Secondary | ICD-10-CM

## 2021-10-22 DIAGNOSIS — Z87891 Personal history of nicotine dependence: Secondary | ICD-10-CM

## 2021-10-22 DIAGNOSIS — E118 Type 2 diabetes mellitus with unspecified complications: Secondary | ICD-10-CM | POA: Diagnosis not present

## 2021-10-22 DIAGNOSIS — N529 Male erectile dysfunction, unspecified: Secondary | ICD-10-CM | POA: Diagnosis not present

## 2021-10-22 DIAGNOSIS — E0590 Thyrotoxicosis, unspecified without thyrotoxic crisis or storm: Secondary | ICD-10-CM

## 2021-10-22 DIAGNOSIS — I10 Essential (primary) hypertension: Secondary | ICD-10-CM

## 2021-10-22 DIAGNOSIS — K219 Gastro-esophageal reflux disease without esophagitis: Secondary | ICD-10-CM | POA: Diagnosis not present

## 2021-10-22 DIAGNOSIS — Z7189 Other specified counseling: Secondary | ICD-10-CM

## 2021-10-22 DIAGNOSIS — M79604 Pain in right leg: Secondary | ICD-10-CM | POA: Diagnosis not present

## 2021-10-22 MED ORDER — SILDENAFIL CITRATE 20 MG PO TABS: 40.0000 mg | ORAL_TABLET | Freq: Every day | ORAL | 3 refills | Status: DC | PRN

## 2021-10-22 NOTE — Patient Instructions (Addendum)
$'20mg'R$  sildenafil generic for viagra sent to pharmacy to price out (insurance won't cover this so would be out of pocket).  ?Call and schedule 2nd shingrix vaccine as you're due.  ?Bring [place] a copy of your living will.  ?Restart hydrochlorothiazide '25mg'$  daily.  ?Schedule appointment with orthopedist.  ?Sugars are now in uncontrolled diabetes range. Limit added sugars, sweets, sodas, and simple carbs, return in 3 months for diabetes follow up visit and if staying high we'll start medication for diabetes.  ?Return in 3 months for diabetes follow up visit.  ? ?Health Maintenance After Age 77 ?After age 7, you are at a higher risk for certain long-term diseases and infections as well as injuries from falls. Falls are a major cause of broken bones and head injuries in people who are older than age 44. Getting regular preventive care can help to keep you healthy and well. Preventive care includes getting regular testing and making lifestyle changes as recommended by your health care provider. Talk with your health care provider about: ?Which screenings and tests you should have. A screening is a test that checks for a disease when you have no symptoms. ?A diet and exercise plan that is right for you. ?What should I know about screenings and tests to prevent falls? ?Screening and testing are the best ways to find a health problem early. Early diagnosis and treatment give you the best chance of managing medical conditions that are common after age 63. Certain conditions and lifestyle choices may make you more likely to have a fall. Your health care provider may recommend: ?Regular vision checks. Poor vision and conditions such as cataracts can make you more likely to have a fall. If you wear glasses, make sure to get your prescription updated if your vision changes. ?Medicine review. Work with your health care provider to regularly review all of the medicines you are taking, including over-the-counter medicines. Ask your  health care provider about any side effects that may make you more likely to have a fall. Tell your health care provider if any medicines that you take make you feel dizzy or sleepy. ?Strength and balance checks. Your health care provider may recommend certain tests to check your strength and balance while standing, walking, or changing positions. ?Foot health exam. Foot pain and numbness, as well as not wearing proper footwear, can make you more likely to have a fall. ?Screenings, including: ?Osteoporosis screening. Osteoporosis is a condition that causes the bones to get weaker and break more easily. ?Blood pressure screening. Blood pressure changes and medicines to control blood pressure can make you feel dizzy. ?Depression screening. You may be more likely to have a fall if you have a fear of falling, feel depressed, or feel unable to do activities that you used to do. ?Alcohol use screening. Using too much alcohol can affect your balance and may make you more likely to have a fall. ?Follow these instructions at home: ?Lifestyle ?Do not drink alcohol if: ?Your health care provider tells you not to drink. ?If you drink alcohol: ?Limit how much you have to: ?0-1 drink a day for women. ?0-2 drinks a day for men. ?Know how much alcohol is in your drink. In the [place] one drink equals one 12 oz bottle of beer (355 mL), one 5 oz glass of wine (148 mL), or one 1? oz glass of hard liquor (44 mL). ?Do not use any products that contain nicotine or tobacco. These products include cigarettes, chewing tobacco, and vaping devices,  such as e-cigarettes. If you need help quitting, ask your health care provider. ?Activity ? ?Follow a regular exercise program to stay fit. This will help you maintain your balance. Ask your health care provider what types of exercise are appropriate for you. ?If you need a cane or walker, use it as recommended by your health care provider. ?Wear supportive shoes that have nonskid  soles. ?Safety ? ?Remove any tripping hazards, such as rugs, cords, and clutter. ?Install safety equipment such as grab bars in bathrooms and safety rails on stairs. ?Keep rooms and walkways well-lit. ?General instructions ?Talk with your health care provider about your risks for falling. Tell your health care provider if: ?You fall. Be sure to tell your health care provider about all falls, even ones that seem minor. ?You feel dizzy, tiredness (fatigue), or off-balance. ?Take over-the-counter and prescription medicines only as told by your health care provider. These include supplements. ?Eat a healthy diet and maintain a healthy weight. A healthy diet includes low-fat dairy products, low-fat (lean) meats, and fiber from whole grains, beans, and lots of fruits and vegetables. ?Stay current with your vaccines. ?Schedule regular health, dental, and eye exams. ?Summary ?Having a healthy lifestyle and getting preventive care can help to protect your health and wellness after age 68. ?Screening and testing are the best way to find a health problem early and help you avoid having a fall. Early diagnosis and treatment give you the best chance for managing medical conditions that are more common for people who are older than age 47. ?Falls are a major cause of broken bones and head injuries in people who are older than age 87. Take precautions to prevent a fall at home. ?Work with your health care provider to learn what changes you can make to improve your health and wellness and to prevent falls. ?This information is not intended to replace advice given to you by your health care provider. Make sure you discuss any questions you have with your health care provider. ?Document Revised: 11/09/2020 Document Reviewed: 11/09/2020 ?Elsevier Patient Education ? [place]. ? ?

## 2021-10-22 NOTE — Progress Notes (Signed)
? ? Patient ID: Gregory Abbott, male    DOB: 09-Mar-1946, 76 y.o.   MRN: [id number] ? ?This visit was conducted in person. ? ?BP (!) 154/80 (BP Location: Right Arm, Cuff Size: Normal)   Pulse 76   Temp 98.1 ?F (36.7 ?C) (Temporal)   Ht '5\' 9"'$  (1.753 m)   Wt 199 lb 6 oz (90.4 kg)   SpO2 95%   BMI 29.44 kg/m?   ? ?CC: CPE ?Subjective:  ? ?HPI: ?Gregory Abbott is a 76 y.o. male presenting on 10/22/2021 for Annual Exam [facility] prt 2. ) ? ? ?Saw health advisor earlier in the week for medicare wellness visit. Note reviewed. 6CIT score normal.  ? ?No results found.  ?Flowsheet Row Clinical Support from 10/18/2021 in [place] [facility]  ?PHQ-2 Total Score 0  ? ?  ?  ? ?  10/18/2021  ? 11:26 AM 10/13/2020  ? 10:33 AM 06/20/2019  ?  2:47 PM 05/29/2019  ?  9:46 AM 06/15/2018  ?  8:20 AM  ?Fall Risk   ?Falls in the past year? 0 0 0 0 0  ?Comment    Emmi Telephone Survey: data to providers prior to load   ?Number falls in past yr: 0 0 0    ?Injury with Fall? 0 0 0    ?Risk for fall due to : No Fall Risks Medication side effect Medication side effect    ?Follow up Falls prevention discussed Falls evaluation completed;Falls prevention discussed Falls prevention discussed;Falls evaluation completed    ? ?HTN - BP elevated today despite amlodipine '5mg'$  daily, atenolol '100mg'$  daily - he's not been taking hctz '25mg'$  daily due to increased polyuria.  ? ?R leg pain seen at ER late last month - told OA related pain. Had normal venous dopplers without DVT. Femur and tib/fib xrays showed severe R hip osteoarthritis. Ongoing pain for a few months. Pain starts at R groin and travels down thigh to anterior shin. No back pain, no numbness/weakness down the leg. ER referred him to ortho surgery, has not seen yet.  ? ?Viagra '100mg'$  not being covered by insurance. States he was advised '20mg'$  dose was more affordable. ?More juicing recently, backing off red meat.  ? ?Preventative: ?COLONOSCOPY 08/2018 - Diminutive adenoma, no f/u planned  Carlean Purl) ?Prostate cancer screening - discussed, normal DRE 2017, monitoring with yearly PSA. No fmhx prostate cancer. Nocturia x3-4.  ?Lung cancer screening - ~25 PY hx. Quit ~2014, smoked late teens to 41s. Undergoing yearly lung cancer screening ?Flu shot - yearly ?COVID vaccine Moderna 08/2019, 09/2019, booster 07/2020 ?Tdap - 2012 ?Prevnar-13 11/2013, pneumovax 2016, 10/2020 - s/p splenectomy will need Q5 yrs  ?Shingrix - discussed  ?Advanced directives - has this at home. Daughter to be HCPOA. Asked to bring [place] copy for chart.  ?Seat belt use discussed.  ?Sunscreen use discussed. No changing moles on skin.  ?Ex smoker - quit ~2015. ~20 PY hx.  ?Alcohol - social (backed off due to itchy rash).  ?Dentist - Q6 months  ?Eye exam - yearly  ?Bowel - no diarrhea/constipation  ?Bladder - no incontinence  ?  ?Lives with wife, no pets ?Occupation: retired, Engineer, mining ?Edu: Degree ?Activity: housework, no regular exercise  ?Diet: good water, fruits/vegetables daily  ?   ? ?Relevant past medical, surgical, family and social history reviewed and updated as indicated. Interim medical history since our last visit reviewed. ?Allergies and medications reviewed and updated. ?Outpatient Medications Prior to Visit  ?Medication Sig  Dispense Refill  ? albuterol (VENTOLIN HFA) 108 (90 Base) MCG/ACT inhaler Inhale 2 puffs into the lungs every 6 (six) hours as needed for wheezing or shortness of breath. 18 g 3  ? amLODipine (NORVASC) 5 MG tablet Take 1 tablet (5 mg total) by mouth daily. 90 tablet 3  ? aspirin EC 81 MG tablet Take 81 mg by mouth every other day.    ? atenolol (TENORMIN) 100 MG tablet Take 1 tablet (100 mg total) by mouth daily after breakfast. 90 tablet 3  ? Cholecalciferol (VITAMIN D3) 25 MCG (1000 UT) CAPS Take 1 capsule (1,000 Units total) by mouth daily. (Patient taking differently: Take 1 capsule by mouth daily. Takes about twice a month) 30 capsule   ? hydrochlorothiazide (HYDRODIURIL) 25 MG tablet Take 1 tablet (25 mg  total) by mouth daily. 90 tablet 3  ? loratadine (CLARITIN) 10 MG tablet Take 1 tablet (10 mg total) by mouth daily. (Patient taking differently: Take 10 mg by mouth daily. As needed)    ? omeprazole (PRILOSEC) 40 MG capsule Take 1 capsule (40 mg total) by mouth daily. 90 capsule 3  ? rosuvastatin (CRESTOR) 5 MG tablet Take 1 tablet (5 mg total) by mouth every Monday, Wednesday, and Friday. 40 tablet 3  ? triamcinolone cream (KENALOG) 0.1 % APPLY TO AFFECTED AREA TWICE A DAY 60 g 1  ? sildenafil (VIAGRA) 100 MG tablet TAKE 1/2 - 1 TABLETS (50-100 MG TOTAL) BY MOUTH DAILY AS NEEDED FOR ERECTILE DYSFUNCTION. 5 tablet 11  ? SHINGRIX injection     ? ?No facility-administered medications prior to visit.  ?  ? ?Per HPI unless specifically indicated in ROS section below ?Review of Systems ? ?Objective:  ?BP (!) 154/80 (BP Location: Right Arm, Cuff Size: Normal)   Pulse 76   Temp 98.1 ?F (36.7 ?C) (Temporal)   Ht '5\' 9"'$  (1.753 m)   Wt 199 lb 6 oz (90.4 kg)   SpO2 95%   BMI 29.44 kg/m?   ?Wt Readings from Last 3 Encounters:  ?10/22/21 199 lb 6 oz (90.4 kg)  ?10/18/21 197 lb (89.4 kg)  ?12/09/20 197 lb 6.4 oz (89.5 kg)  ?  ?  ?Physical Exam ?Vitals and nursing note reviewed.  ?Constitutional:   ?   General: He is not in acute distress. ?   Appearance: Normal appearance. He is well-developed. He is not ill-appearing.  ?HENT:  ?   Head: Normocephalic and atraumatic.  ?   Right Ear: Hearing, tympanic membrane, ear canal and external ear normal.  ?   Left Ear: Hearing, tympanic membrane, ear canal and external ear normal.  ?Eyes:  ?   General: No scleral icterus. ?   Extraocular Movements: Extraocular movements intact.  ?   Conjunctiva/sclera: Conjunctivae normal.  ?   Pupils: Pupils are equal, round, and reactive to light.  ?Neck:  ?   Thyroid: No thyroid mass or thyromegaly.  ?   Vascular: No carotid bruit.  ?Cardiovascular:  ?   Rate and Rhythm: Normal rate and regular rhythm.  ?   Pulses: Normal pulses.     ?     Radial  pulses are 2+ on the right side and 2+ on the left side.  ?   Heart sounds: Normal heart sounds. No murmur heard. ?Pulmonary:  ?   Effort: Pulmonary effort is normal. No respiratory distress.  ?   Breath sounds: Normal breath sounds. No wheezing, rhonchi or rales.  ?Abdominal:  ?   General: Bowel  sounds are normal. There is no distension.  ?   Palpations: Abdomen is soft. There is no mass.  ?   Tenderness: There is no abdominal tenderness. There is no guarding or rebound.  ?   Hernia: No hernia is present.  ?Musculoskeletal:     ?   General: Normal range of motion.  ?   Cervical back: Normal range of motion and neck supple.  ?   Right lower leg: No edema.  ?   Left lower leg: No edema.  ?   Comments:  ?No pain midline spine ?No paraspinous mm tenderness ?Neg SLR bilaterally. ?++ pain with int/ext rotation at R hip  ?Crepitus with flexion/extension of R knee  ?Lymphadenopathy:  ?   Cervical: No cervical adenopathy.  ?Skin: ?   General: Skin is warm and dry.  ?   Findings: No rash.  ?Neurological:  ?   General: No focal deficit present.  ?   Mental Status: He is alert and oriented to person, place, and time.  ?Psychiatric:     ?   Mood and Affect: Mood normal.     ?   Behavior: Behavior normal.     ?   Thought Content: Thought content normal.     ?   Judgment: Judgment normal.  ? ?   ?Results for orders placed or performed in visit on 10/15/21  ?PSA, Medicare  ?Result Value Ref Range  ? PSA 1.08 0.10 - 4.00 ng/ml  ?Microalbumin / creatinine urine ratio  ?Result Value Ref Range  ? Microalb, Ur 0.8 0.0 - 1.9 mg/dL  ? Creatinine,U 159.9 mg/dL  ? Microalb Creat Ratio 0.5 0.0 - 30.0 mg/g  ?T4, free  ?Result Value Ref Range  ? Free T4 1.01 0.60 - 1.60 ng/dL  ?TSH  ?Result Value Ref Range  ? TSH 1.74 0.35 - 5.50 uIU/mL  ?Comprehensive metabolic panel  ?Result Value Ref Range  ? Sodium 137 135 - 145 mEq/L  ? Potassium 4.5 3.5 - 5.1 mEq/L  ? Chloride 101 96 - 112 mEq/L  ? CO2 29 19 - 32 mEq/L  ? Glucose, Bld 145 (H) 70 - 99  mg/dL  ? BUN 14 6 - 23 mg/dL  ? Creatinine, Ser 0.95 0.40 - 1.50 mg/dL  ? Total Bilirubin 1.0 0.2 - 1.2 mg/dL  ? Alkaline Phosphatase 56 39 - 117 U/L  ? AST 12 0 - 37 U/L  ? ALT 13 0 - 53 U/L  ? Total Protein 7.0 6

## 2021-10-22 NOTE — Assessment & Plan Note (Signed)
BP elevated - rec restart HCTZ. Continue amlodipine and atenolol. Reassess at f/u in 3 months.  ?

## 2021-10-22 NOTE — Assessment & Plan Note (Addendum)
Advanced directives - has this at home. Daughter to be HCPOA. Asked to bring [place] copy for chart.  ?

## 2021-10-23 DIAGNOSIS — G8929 Other chronic pain: Secondary | ICD-10-CM | POA: Insufficient documentation

## 2021-10-23 DIAGNOSIS — M79604 Pain in right leg: Secondary | ICD-10-CM | POA: Insufficient documentation

## 2021-10-23 NOTE — Assessment & Plan Note (Signed)
Chronic, stable on daily PPI.  

## 2021-10-23 NOTE — Assessment & Plan Note (Signed)
Chronic, deteriorated - declines starting medication. Will focus on diabetic diet over next 3 months and reassess at DM f/u visit.  ?

## 2021-10-23 NOTE — Assessment & Plan Note (Signed)
Reviewed ER evaluation - xrays showing significant degenerative arthritis at R hip joint - anticipate this is where pain stems from. Discussed possible need for hip replacement - he will call and schedule orthopedist evaluation.  ?

## 2021-10-23 NOTE — Assessment & Plan Note (Signed)
Viagra no longer affordable. ?Requests trial '20mg'$  sildenafil - discussed this would be out of pocket as insurance doesn't cover this dose for ED.  ?

## 2021-10-23 NOTE — Assessment & Plan Note (Signed)
Labs stable

## 2021-10-23 NOTE — Assessment & Plan Note (Signed)
Overall asxs. ?

## 2021-10-23 NOTE — Assessment & Plan Note (Addendum)
Chronic, stable on low dose crestor - continue.  ?The 10-year ASCVD risk score (Arnett DK, et al., 2019) is: 49.7% ?  Values used to calculate the score: ?    Age: 76 years ?    Sex: Male ?    Is Non-Hispanic African American: Yes ?    Diabetic: Yes ?    Tobacco smoker: No ?    Systolic Blood Pressure: 721 mmHg ?    Is BP treated: Yes ?    HDL Cholesterol: 46.3 mg/dL ?    Total Cholesterol: 174 mg/dL  ?

## 2021-10-23 NOTE — Assessment & Plan Note (Signed)
Remains abstinent. ?Undergoing lung cancer screening program.  ?

## 2021-11-03 ENCOUNTER — Telehealth: Payer: Self-pay | Admitting: Family Medicine

## 2021-11-03 NOTE — Telephone Encounter (Signed)
Patient stated that the script for his Sildenafil was sent to [facility]. Patient stated that he has a GoodRx card and can get the medication cheaper at [facility]. Patient was advised that he should be able to get [facility] to contact [facility] and have the script transferred for him. Patient stated that he will contact [facility]. ?

## 2021-11-03 NOTE — Telephone Encounter (Signed)
Pt wants to speak with the nurse about a written prescription and he wants to go to another pharmacist.  Medication: sildenafil (REVATIO) 20 MG tablet ? ?Callback Number: [phone] ?

## 2021-11-08 ENCOUNTER — Telehealth: Payer: Self-pay | Admitting: Family Medicine

## 2021-11-08 NOTE — Telephone Encounter (Signed)
Reason for Referral Request: Still limping ? ?Has patient been seen PCP for this complaint? ?N/A  ? ?Patient scheduled on: Last seen on 10/22/2021 by Dr. Danise Mina ? ?Referral for which specialty: Physical Therapy ? ?Preferred office/provider: N/A ? ?  ?

## 2021-11-09 NOTE — Telephone Encounter (Signed)
Patient notified as instructed by telephone and verbalized understanding. Patient stated that he has not seen an orthopedist. Patient stated that he was referred to Dr. Merita Norton. Patient stated that he has all of the contact information and will call and schedule an appointment. Patient stated that he will call back if he needs any help with a referral. ?

## 2021-11-09 NOTE — Telephone Encounter (Signed)
Limp is thought to be coming from hip osteoarthritis.  Plan was for him to see orthopedist for evaluation.  Did he see Ortho?  Does he need a referral from [place] this? ?I would first want Ortho evaluation prior to referring to physical therapy ?

## 2021-11-11 DIAGNOSIS — M1611 Unilateral primary osteoarthritis, right hip: Secondary | ICD-10-CM | POA: Diagnosis not present

## 2021-11-13 ENCOUNTER — Other Ambulatory Visit: Payer: Self-pay | Admitting: Family Medicine

## 2021-11-15 NOTE — Telephone Encounter (Signed)
Kenalog cream ?Last filled: 09/01/21, #60 g ?Last OV:  10/22/21, AWV prt 2 ?Next OV: none ?

## 2021-11-18 DIAGNOSIS — R531 Weakness: Secondary | ICD-10-CM | POA: Diagnosis not present

## 2021-11-18 DIAGNOSIS — M25651 Stiffness of right hip, not elsewhere classified: Secondary | ICD-10-CM | POA: Diagnosis not present

## 2021-11-18 DIAGNOSIS — M1611 Unilateral primary osteoarthritis, right hip: Secondary | ICD-10-CM | POA: Diagnosis not present

## 2021-11-23 DIAGNOSIS — R531 Weakness: Secondary | ICD-10-CM | POA: Diagnosis not present

## 2021-11-23 DIAGNOSIS — M25651 Stiffness of right hip, not elsewhere classified: Secondary | ICD-10-CM | POA: Diagnosis not present

## 2021-11-23 DIAGNOSIS — M1611 Unilateral primary osteoarthritis, right hip: Secondary | ICD-10-CM | POA: Diagnosis not present

## 2021-11-26 DIAGNOSIS — M25651 Stiffness of right hip, not elsewhere classified: Secondary | ICD-10-CM | POA: Diagnosis not present

## 2021-11-26 DIAGNOSIS — M1611 Unilateral primary osteoarthritis, right hip: Secondary | ICD-10-CM | POA: Diagnosis not present

## 2021-11-26 DIAGNOSIS — R531 Weakness: Secondary | ICD-10-CM | POA: Diagnosis not present

## 2021-11-30 DIAGNOSIS — R531 Weakness: Secondary | ICD-10-CM | POA: Diagnosis not present

## 2021-11-30 DIAGNOSIS — M25651 Stiffness of right hip, not elsewhere classified: Secondary | ICD-10-CM | POA: Diagnosis not present

## 2021-11-30 DIAGNOSIS — M1611 Unilateral primary osteoarthritis, right hip: Secondary | ICD-10-CM | POA: Diagnosis not present

## 2021-12-02 DIAGNOSIS — M25651 Stiffness of right hip, not elsewhere classified: Secondary | ICD-10-CM | POA: Diagnosis not present

## 2021-12-02 DIAGNOSIS — M1611 Unilateral primary osteoarthritis, right hip: Secondary | ICD-10-CM | POA: Diagnosis not present

## 2021-12-02 DIAGNOSIS — R531 Weakness: Secondary | ICD-10-CM | POA: Diagnosis not present

## 2021-12-07 DIAGNOSIS — M25651 Stiffness of right hip, not elsewhere classified: Secondary | ICD-10-CM | POA: Diagnosis not present

## 2021-12-07 DIAGNOSIS — M1611 Unilateral primary osteoarthritis, right hip: Secondary | ICD-10-CM | POA: Diagnosis not present

## 2021-12-07 DIAGNOSIS — R531 Weakness: Secondary | ICD-10-CM | POA: Diagnosis not present

## 2021-12-09 ENCOUNTER — Ambulatory Visit
Admission: RE | Admit: 2021-12-09 | Discharge: 2021-12-09 | Disposition: A | Discharge status: Home / Self Care | Payer: Medicare Other | Source: Ambulatory Visit | Attending: Family Medicine | Admitting: Family Medicine

## 2021-12-09 DIAGNOSIS — J432 Centrilobular emphysema: Secondary | ICD-10-CM | POA: Diagnosis not present

## 2021-12-09 DIAGNOSIS — I2510 Atherosclerotic heart disease of native coronary artery without angina pectoris: Secondary | ICD-10-CM | POA: Diagnosis not present

## 2021-12-09 DIAGNOSIS — Z87891 Personal history of nicotine dependence: Secondary | ICD-10-CM

## 2021-12-09 DIAGNOSIS — I898 Other specified noninfective disorders of lymphatic vessels and lymph nodes: Secondary | ICD-10-CM | POA: Diagnosis not present

## 2021-12-10 DIAGNOSIS — M1611 Unilateral primary osteoarthritis, right hip: Secondary | ICD-10-CM | POA: Diagnosis not present

## 2021-12-10 DIAGNOSIS — R531 Weakness: Secondary | ICD-10-CM | POA: Diagnosis not present

## 2021-12-10 DIAGNOSIS — M25651 Stiffness of right hip, not elsewhere classified: Secondary | ICD-10-CM | POA: Diagnosis not present

## 2021-12-13 ENCOUNTER — Other Ambulatory Visit: Payer: Self-pay | Admitting: Acute Care

## 2021-12-13 DIAGNOSIS — Z87891 Personal history of nicotine dependence: Secondary | ICD-10-CM

## 2021-12-13 DIAGNOSIS — Z122 Encounter for screening for malignant neoplasm of respiratory organs: Secondary | ICD-10-CM

## 2021-12-14 DIAGNOSIS — M1611 Unilateral primary osteoarthritis, right hip: Secondary | ICD-10-CM | POA: Diagnosis not present

## 2021-12-14 DIAGNOSIS — R531 Weakness: Secondary | ICD-10-CM | POA: Diagnosis not present

## 2021-12-14 DIAGNOSIS — M25651 Stiffness of right hip, not elsewhere classified: Secondary | ICD-10-CM | POA: Diagnosis not present

## 2021-12-16 DIAGNOSIS — M25651 Stiffness of right hip, not elsewhere classified: Secondary | ICD-10-CM | POA: Diagnosis not present

## 2021-12-16 DIAGNOSIS — M1611 Unilateral primary osteoarthritis, right hip: Secondary | ICD-10-CM | POA: Diagnosis not present

## 2021-12-16 DIAGNOSIS — R531 Weakness: Secondary | ICD-10-CM | POA: Diagnosis not present

## 2022-01-14 ENCOUNTER — Encounter: Payer: Self-pay | Admitting: Family Medicine

## 2022-01-14 ENCOUNTER — Ambulatory Visit (INDEPENDENT_AMBULATORY_CARE_PROVIDER_SITE_OTHER): Payer: Medicare Other | Admitting: Family Medicine

## 2022-01-14 VITALS — BP 170/90 | HR 76 | Temp 97.8°F | Ht 69.0 in | Wt 196.5 lb

## 2022-01-14 DIAGNOSIS — J449 Chronic obstructive pulmonary disease, unspecified: Secondary | ICD-10-CM

## 2022-01-14 DIAGNOSIS — R0989 Other specified symptoms and signs involving the circulatory and respiratory systems: Secondary | ICD-10-CM | POA: Insufficient documentation

## 2022-01-14 DIAGNOSIS — E1169 Type 2 diabetes mellitus with other specified complication: Secondary | ICD-10-CM

## 2022-01-14 DIAGNOSIS — E785 Hyperlipidemia, unspecified: Secondary | ICD-10-CM | POA: Diagnosis not present

## 2022-01-14 DIAGNOSIS — I10 Essential (primary) hypertension: Secondary | ICD-10-CM | POA: Diagnosis not present

## 2022-01-14 LAB — POCT GLYCOSYLATED HEMOGLOBIN (HGB A1C): Hemoglobin A1C: 7.2 % — AB (ref 4.0–5.6)

## 2022-01-14 MED ORDER — ROSUVASTATIN CALCIUM 5 MG PO TABS: 5.0000 mg | ORAL_TABLET | Freq: Every day | ORAL | 3 refills | Status: DC

## 2022-01-14 MED ORDER — AMLODIPINE BESYLATE 10 MG PO TABS: 10.0000 mg | ORAL_TABLET | Freq: Every day | ORAL | 3 refills | Status: DC

## 2022-01-14 MED ORDER — ALBUTEROL SULFATE HFA 108 (90 BASE) MCG/ACT IN AERS: 2.0000 | INHALATION_SPRAY | Freq: Four times a day (QID) | RESPIRATORY_TRACT | 1 refills | Status: DC | PRN

## 2022-01-14 NOTE — Patient Instructions (Addendum)
Check with pharmacy on preferred sugar meter brand and let me know and I will send that in (accu-chek, one touch, contour next, etc).  Goal blood pressure <140/90. Keep checking at home, let me know if staying above this to change BP medicines.  Increase amlodipine to '10mg'$  daily - sent to pharmacy. You may double up on what you have at home until you run out. Watch for ankle swelling on higher dose.  Continue crestor '5mg'$  daily.  Return in 3 months for follow up visit.   Normal fasting sugar 80-120 Normal sugar 2 hours after a meal <180

## 2022-01-14 NOTE — Progress Notes (Addendum)
Patient ID: Gregory Abbott, male    DOB: 10/21/1945, 76 y.o.   MRN: [id number]  This visit was conducted in person.  BP (!) 170/90 (BP Location: Right Arm, Cuff Size: Normal)   Pulse 76   Temp 97.8 F (36.6 C) (Temporal)   Ht '5\' 9"'$  (1.753 m)   Wt 196 lb 8 oz (89.1 kg)   SpO2 96%   BMI 29.02 kg/m    Elevated on recheck 180/90s  CC: 3 mo DM f/u visit  Subjective:   HPI: Gregory Abbott is a 76 y.o. male presenting on 01/14/2022 for Follow-up (Here for 3 mo DM and HTN f/u.)   Recent lung cancer screen CT scan 12/2021 - aort ATH, CAC, mod emphysema. Notes some shortness of breath when mowing lawn due to pollen/grass.   HTN - Compliant with current antihypertensive regimen of amlodipine '5mg'$  daily, atenolol '100mg'$  daily, hctz '25mg'$  daily.  Does check blood pressures at home: 150/80.  No low blood pressure readings or symptoms of dizziness/syncope.  Denies HA, vision changes, CP/tightness, SOB, leg swelling.    DM - does not regularly check sugars. Compliant with antihyperglycemic regimen which includes: diet controlled. He continues working towards low sugar low carb diet, has backed off sodas. Denies low sugars or hypoglycemic symptoms. Denies paresthesias, blurry vision. Last diabetic eye exam 09/2021. Glucometer brand: doesn't have one. Last foot exam: 12/2019 - DUE. DSME: declines. Lab Results  Component Value Date   HGBA1C 7.2 (A) 01/14/2022   Diabetic Foot Exam - Simple   Simple Foot Form Diabetic Foot exam was performed with the following findings: Yes 01/14/2022  9:02 AM  Visual Inspection No deformities, no ulcerations, no other skin breakdown bilaterally: Yes Sensation Testing Intact to touch and monofilament testing bilaterally: Yes Pulse Check See comments: Yes Comments Diminished to L leg    Lab Results  Component Value Date   MICROALBUR 0.8 10/15/2021         Relevant past medical, surgical, family and social history reviewed and updated as indicated. Interim  medical history since our last visit reviewed. Allergies and medications reviewed and updated. Outpatient Medications Prior to Visit  Medication Sig Dispense Refill   aspirin EC 81 MG tablet Take 81 mg by mouth every other day.     atenolol (TENORMIN) 100 MG tablet TAKE 1 TABLET (100 MG TOTAL) BY MOUTH DAILY AFTER BREAKFAST. 90 tablet 3   Cholecalciferol (VITAMIN D3) 25 MCG (1000 UT) CAPS Take 1 capsule (1,000 Units total) by mouth daily. (Patient taking differently: Take 1 capsule by mouth daily. Takes about twice a month) 30 capsule    hydrochlorothiazide (HYDRODIURIL) 25 MG tablet TAKE 1 TABLET (25 MG TOTAL) BY MOUTH DAILY. 90 tablet 3   loratadine (CLARITIN) 10 MG tablet Take 1 tablet (10 mg total) by mouth daily. (Patient taking differently: Take 10 mg by mouth daily. As needed)     omeprazole (PRILOSEC) 40 MG capsule TAKE 1 CAPSULE (40 MG TOTAL) BY MOUTH DAILY. 90 capsule 3   sildenafil (REVATIO) 20 MG tablet Take 2-5 tablets (40-100 mg total) by mouth daily as needed (relations). 30 tablet 3   triamcinolone cream (KENALOG) 0.1 % APPLY TO AFFECTED AREA TWICE A DAY 60 g 1   TURMERIC PO Take by mouth daily.     albuterol (VENTOLIN HFA) 108 (90 Base) MCG/ACT inhaler Inhale 2 puffs into the lungs every 6 (six) hours as needed for wheezing or shortness of breath. 18 g 3   amLODipine (  NORVASC) 5 MG tablet TAKE 1 TABLET (5 MG TOTAL) BY MOUTH DAILY. 90 tablet 3   rosuvastatin (CRESTOR) 5 MG tablet TAKE 1 TABLET (5 MG TOTAL) BY MOUTH EVERY MONDAY, WEDNESDAY, AND FRIDAY. 40 tablet 3   meloxicam (MOBIC) 15 MG tablet Take 15 mg by mouth daily.     No facility-administered medications prior to visit.     Per HPI unless specifically indicated in ROS section below Review of Systems  Objective:  BP (!) 170/90 (BP Location: Right Arm, Cuff Size: Normal)   Pulse 76   Temp 97.8 F (36.6 C) (Temporal)   Ht '5\' 9"'$  (1.753 m)   Wt 196 lb 8 oz (89.1 kg)   SpO2 96%   BMI 29.02 kg/m   Wt Readings from  Last 3 Encounters:  01/14/22 196 lb 8 oz (89.1 kg)  10/22/21 199 lb 6 oz (90.4 kg)  10/18/21 197 lb (89.4 kg)      Physical Exam Vitals and nursing note reviewed.  Constitutional:      Appearance: Normal appearance. He is not ill-appearing.  Eyes:     Extraocular Movements: Extraocular movements intact.     Conjunctiva/sclera: Conjunctivae normal.     Pupils: Pupils are equal, round, and reactive to light.  Cardiovascular:     Rate and Rhythm: Normal rate and regular rhythm.     Pulses: Normal pulses.     Heart sounds: Normal heart sounds. No murmur heard. Pulmonary:     Effort: Pulmonary effort is normal. No respiratory distress.     Breath sounds: Normal breath sounds. No wheezing, rhonchi or rales.     Comments: R>L bibasilar crackles Musculoskeletal:     Right lower leg: No edema.     Left lower leg: No edema.     Comments:  See HPI for foot exam if done Diminished pedal pulses on left  Skin:    General: Skin is warm and dry.     Findings: No rash.  Neurological:     Mental Status: He is alert.  Psychiatric:        Mood and Affect: Mood normal.        Behavior: Behavior normal.       Results for orders placed or performed in visit on 01/14/22  POCT glycosylated hemoglobin (Hb A1C)  Result Value Ref Range   Hemoglobin A1C 7.2 (A) 4.0 - 5.6 %   HbA1c POC (<> result, manual entry)     HbA1c, POC (prediabetic range)     HbA1c, POC (controlled diabetic range)     Lab Results  Component Value Date   CHOL 174 10/15/2021   HDL 46.30 10/15/2021   LDLCALC 100 (H) 10/15/2021   LDLDIRECT 143.0 03/30/2015   TRIG 142.0 10/15/2021   CHOLHDL 4 10/15/2021    Assessment & Plan:   Problem List Items Addressed This Visit     Hypertension    Chronic, deteriorated despite 3 drug regimen. Losartan caused itchy rash. Will increase amlodipine to '10mg'$  daily. Reassess at 3 mo f/u visit.       Relevant Medications   amLODipine (NORVASC) 10 MG tablet   rosuvastatin (CRESTOR)  5 MG tablet   COPD (chronic obstructive pulmonary disease) (HCC)    Requests albuterol refill - no recent respiratory symptoms.  Mod COPD by imaging study      Relevant Medications   albuterol (VENTOLIN HFA) 108 (90 Base) MCG/ACT inhaler   Type 2 diabetes mellitus with other specified complication ([place]) - Primary  Chronic, improving. Reviewed diabetic diet. Declines medication. Reassess control at CPE.  He will let me know preferred glucometer brand.  Discussed fasting and postprandial glycemic goals.       Relevant Medications   rosuvastatin (CRESTOR) 5 MG tablet   Other Relevant Orders   POCT glycosylated hemoglobin (Hb A1C) (Completed)   Hyperlipidemia associated with type 2 diabetes mellitus (HCC)    LDL 100 - he's increased crestor to '5mg'$  daily - med list updated.       Relevant Medications   amLODipine (NORVASC) 10 MG tablet   rosuvastatin (CRESTOR) 5 MG tablet   Abnormal lung sounds    Persistent R>L bibasilar crackles Recent lung cancer screening CT results reviewed.      Diminished pulses in lower extremity    Diminished L pedal pulses. Denies leg pain or claudication symptoms. Will continue to monitor, consider ABI.         Meds ordered this encounter  Medications   amLODipine (NORVASC) 10 MG tablet    Sig: Take 1 tablet (10 mg total) by mouth daily.    Dispense:  90 tablet    Refill:  3   rosuvastatin (CRESTOR) 5 MG tablet    Sig: Take 1 tablet (5 mg total) by mouth daily.    Dispense:  90 tablet    Refill:  3    Note new dose   albuterol (VENTOLIN HFA) 108 (90 Base) MCG/ACT inhaler    Sig: Inhale 2 puffs into the lungs every 6 (six) hours as needed for wheezing or shortness of breath.    Dispense:  18 g    Refill:  1    Formulate based on insurance preference/affordability   Orders Placed This Encounter  Procedures   POCT glycosylated hemoglobin (Hb A1C)     Patient Instructions  Check with pharmacy on preferred sugar meter brand and let me  know and I will send that in (accu-chek, one touch, contour next, etc).  Goal blood pressure <140/90. Keep checking at home, let me know if staying above this to change BP medicines.  Increase amlodipine to '10mg'$  daily - sent to pharmacy. You may double up on what you have at home until you run out. Watch for ankle swelling on higher dose.  Continue crestor '5mg'$  daily.  Return in 3 months for follow up visit.   Normal fasting sugar 80-120 Normal sugar 2 hours after a meal <180  Follow up plan: Return in about 3 months (around 04/16/2022), or if symptoms worsen or fail to improve, for follow up visit.  Ria Bush, MD

## 2022-01-14 NOTE — Assessment & Plan Note (Addendum)
Chronic, improving. Reviewed diabetic diet. Declines medication. Reassess control at CPE.  He will let me know preferred glucometer brand.  Discussed fasting and postprandial glycemic goals.

## 2022-01-14 NOTE — Assessment & Plan Note (Signed)
Chronic, deteriorated despite 3 drug regimen. Losartan caused itchy rash. Will increase amlodipine to '10mg'$  daily. Reassess at 3 mo f/u visit.

## 2022-01-14 NOTE — Assessment & Plan Note (Signed)
Diminished L pedal pulses. Denies leg pain or claudication symptoms. Will continue to monitor, consider ABI.

## 2022-01-14 NOTE — Assessment & Plan Note (Addendum)
Persistent R>L bibasilar crackles Recent lung cancer screening CT results reviewed.

## 2022-01-14 NOTE — Assessment & Plan Note (Signed)
LDL 100 - he's increased crestor to '5mg'$  daily - med list updated.

## 2022-01-14 NOTE — Assessment & Plan Note (Signed)
Requests albuterol refill - no recent respiratory symptoms.  Mod COPD by imaging study

## 2022-02-23 ENCOUNTER — Other Ambulatory Visit: Payer: Self-pay | Admitting: Family Medicine

## 2022-02-23 NOTE — Telephone Encounter (Signed)
Refill request Sildenafil Last refill 10/22/21 #30/3 Last office visit  01/14/22

## 2022-03-13 ENCOUNTER — Other Ambulatory Visit: Payer: Self-pay | Admitting: Family Medicine

## 2022-04-12 ENCOUNTER — Ambulatory Visit (INDEPENDENT_AMBULATORY_CARE_PROVIDER_SITE_OTHER): Payer: Medicare Other | Admitting: Family Medicine

## 2022-04-12 ENCOUNTER — Ambulatory Visit (INDEPENDENT_AMBULATORY_CARE_PROVIDER_SITE_OTHER)
Admission: RE | Admit: 2022-04-12 | Discharge: 2022-04-12 | Disposition: A | Discharge status: Home / Self Care | Payer: Medicare Other | Source: Ambulatory Visit | Attending: Family Medicine | Admitting: Family Medicine

## 2022-04-12 ENCOUNTER — Encounter: Payer: Self-pay | Admitting: Family Medicine

## 2022-04-12 VITALS — BP 120/72 | HR 68 | Temp 97.9°F | Ht 69.0 in | Wt 198.0 lb

## 2022-04-12 DIAGNOSIS — M11261 Other chondrocalcinosis, right knee: Secondary | ICD-10-CM | POA: Diagnosis not present

## 2022-04-12 DIAGNOSIS — R350 Frequency of micturition: Secondary | ICD-10-CM

## 2022-04-12 DIAGNOSIS — Z23 Encounter for immunization: Secondary | ICD-10-CM

## 2022-04-12 DIAGNOSIS — M1611 Unilateral primary osteoarthritis, right hip: Secondary | ICD-10-CM

## 2022-04-12 DIAGNOSIS — M79604 Pain in right leg: Secondary | ICD-10-CM

## 2022-04-12 DIAGNOSIS — R21 Rash and other nonspecific skin eruption: Secondary | ICD-10-CM | POA: Diagnosis not present

## 2022-04-12 DIAGNOSIS — M1120 Other chondrocalcinosis, unspecified site: Secondary | ICD-10-CM | POA: Diagnosis not present

## 2022-04-12 DIAGNOSIS — R0989 Other specified symptoms and signs involving the circulatory and respiratory systems: Secondary | ICD-10-CM | POA: Diagnosis not present

## 2022-04-12 DIAGNOSIS — I10 Essential (primary) hypertension: Secondary | ICD-10-CM | POA: Diagnosis not present

## 2022-04-12 LAB — CBC WITH DIFFERENTIAL/PLATELET
Basophils Absolute: 0.1 10*3/uL (ref 0.0–0.1)
Basophils Relative: 0.7 % (ref 0.0–3.0)
Eosinophils Absolute: 0.9 10*3/uL — ABNORMAL HIGH (ref 0.0–0.7)
Eosinophils Relative: 9.9 % — ABNORMAL HIGH (ref 0.0–5.0)
HCT: 47.6 % (ref 39.0–52.0)
Hemoglobin: 15.9 g/dL (ref 13.0–17.0)
Lymphocytes Relative: 32.5 % (ref 12.0–46.0)
Lymphs Abs: 2.8 10*3/uL (ref 0.7–4.0)
MCHC: 33.4 g/dL (ref 30.0–36.0)
MCV: 94.5 fl (ref 78.0–100.0)
Monocytes Absolute: 0.9 10*3/uL (ref 0.1–1.0)
Monocytes Relative: 10.1 % (ref 3.0–12.0)
Neutro Abs: 4 10*3/uL (ref 1.4–7.7)
Neutrophils Relative %: 46.8 % (ref 43.0–77.0)
Platelets: 299 10*3/uL (ref 150.0–400.0)
RBC: 5.04 Mil/uL (ref 4.22–5.81)
RDW: 14.3 % (ref 11.5–15.5)
WBC: 8.6 10*3/uL (ref 4.0–10.5)

## 2022-04-12 LAB — COMPREHENSIVE METABOLIC PANEL WITH GFR
ALT: 20 U/L (ref 0–53)
AST: 18 U/L (ref 0–37)
Albumin: 4.4 g/dL (ref 3.5–5.2)
Alkaline Phosphatase: 54 U/L (ref 39–117)
BUN: 16 mg/dL (ref 6–23)
CO2: 29 meq/L (ref 19–32)
Calcium: 10.2 mg/dL (ref 8.4–10.5)
Chloride: 98 meq/L (ref 96–112)
Creatinine, Ser: 1.07 mg/dL (ref 0.40–1.50)
GFR: 67.26 mL/min (ref >60.00)
Glucose, Bld: 162 mg/dL — ABNORMAL HIGH (ref 70–99)
Potassium: 3.8 meq/L (ref 3.5–5.1)
Sodium: 135 meq/L (ref 135–145)
Total Bilirubin: 0.6 mg/dL (ref 0.2–1.2)
Total Protein: 8.5 g/dL — ABNORMAL HIGH (ref 6.0–8.3)

## 2022-04-12 LAB — POC URINALSYSI DIPSTICK (AUTOMATED)
Bilirubin, UA: NEGATIVE
Blood, UA: NEGATIVE
Glucose, UA: NEGATIVE
Ketones, UA: NEGATIVE
Leukocytes, UA: NEGATIVE
Nitrite, UA: NEGATIVE
Protein, UA: NEGATIVE
Spec Grav, UA: 1.02 (ref 1.010–1.025)
Urobilinogen, UA: 0.2 E.U./dL
pH, UA: 6 (ref 5.0–8.0)

## 2022-04-12 LAB — SEDIMENTATION RATE: Sed Rate: 20 mm/hr (ref 0–20)

## 2022-04-12 MED ORDER — PREDNISONE 20 MG PO TABS: ORAL_TABLET | ORAL | 0 refills | Status: DC

## 2022-04-12 MED ORDER — TRIAMCINOLONE ACETONIDE 0.1 % EX CREA: TOPICAL_CREAM | Freq: Two times a day (BID) | CUTANEOUS | 1 refills | Status: DC

## 2022-04-12 NOTE — Progress Notes (Unsigned)
Patient ID: Gregory Abbott, male    DOB: [birth year], 76 y.o.   MRN: [id number]  This visit was conducted in person.  BP 120/72 (BP Location: Left Arm, Patient Position: Sitting, Cuff Size: Large)   Pulse 68   Temp 97.9 F (36.6 C)   Ht '5\' 9"'$  (1.753 m)   Wt 198 lb (89.8 kg)   SpO2 97%   BMI 29.24 kg/m    CC: 3 mo f/u visit  Subjective:   HPI: Gregory Abbott is a 76 y.o. male presenting on 04/12/2022 for 3 Month Follow-up BP, Rash (Was seen early last year for same thing. Comes and goes.), Right Leg Pain, and Urinary Frequency/Dysuria   HTN - Compliant with current antihypertensive regimen of amlodipine '5mg'$  daily, atenolol '100mg'$  daily, hctz '25mg'$  daily. Last visit we increased amlodipine to '10mg'$  daily - tolerating this well without ankle swelling. Does not check blood pressures at home.   Known R hip osteoarthritis by xray 09/2021 - referred to ortho, saw Dr Tamera Punt treated with meloxicam '15mg'$  daily 12/2021, also went to PT with some benefit.   Now noticing R leg pain from knee to ankle, occasionally from hip to knee. Describes throbbing aching pain to leg. Notes R knee swelling. No long car or plane rides. No h/o blood clots.  Seen at ER 09/2021 for R leg pain s/p reassuring xrays and RLE venous US r/o DVT. Xray did show significant R hip osteoarthritis.   Recurrent skin rash that started about 6 weeks ago. Bumpy itchy rash to R>L inner thigh as well as bilateral arms/forearms, some to anterior chest and around navel.  Last seen 07/2020 for similar rash. At that time treated for eczematous rash with prednisone course and mild potency topical steroid.  Previously saw Allyson Sabal derm 2018 for possible drug rash that improved off losartan.   Notes increased polyuria and urinary frequency for several years now. Notes incomplete emptying, nocturia x3-4. No dysuria.      Relevant past medical, surgical, family and social history reviewed and updated as indicated. Interim medical history since  our last visit reviewed. Allergies and medications reviewed and updated. Outpatient Medications Prior to Visit  Medication Sig Dispense Refill   albuterol (VENTOLIN HFA) 108 (90 Base) MCG/ACT inhaler TAKE 2 PUFFS BY MOUTH EVERY 6 HOURS AS NEEDED FOR WHEEZE OR SHORTNESS OF BREATH 8.5 each 0   amLODipine (NORVASC) 10 MG tablet Take 1 tablet (10 mg total) by mouth daily. 90 tablet 3   aspirin EC 81 MG tablet Take 81 mg by mouth every other day.     atenolol (TENORMIN) 100 MG tablet TAKE 1 TABLET (100 MG TOTAL) BY MOUTH DAILY AFTER BREAKFAST. 90 tablet 3   Cholecalciferol (VITAMIN D3) 25 MCG (1000 UT) CAPS Take 1 capsule (1,000 Units total) by mouth daily. (Patient taking differently: Take 1 capsule by mouth daily. Takes about twice a month) 30 capsule    hydrochlorothiazide (HYDRODIURIL) 25 MG tablet TAKE 1 TABLET (25 MG TOTAL) BY MOUTH DAILY. 90 tablet 3   loratadine (CLARITIN) 10 MG tablet Take 1 tablet (10 mg total) by mouth daily. (Patient taking differently: Take 10 mg by mouth daily. As needed)     meloxicam (MOBIC) 15 MG tablet Take 15 mg by mouth daily.     omeprazole (PRILOSEC) 40 MG capsule TAKE 1 CAPSULE (40 MG TOTAL) BY MOUTH DAILY. 90 capsule 3   rosuvastatin (CRESTOR) 5 MG tablet Take 1 tablet (5 mg total) by mouth daily. [place]  tablet 3   sildenafil (REVATIO) 20 MG tablet TAKE 2-5 TABLETS BY MOUTH EACH DAY AS NEEDED (RELATIONS) 30 tablet 3   triamcinolone cream (KENALOG) 0.1 % APPLY TO AFFECTED AREA TWICE A DAY 60 g 1   TURMERIC PO Take by mouth daily. (Patient not taking: Reported on 04/12/2022)     No facility-administered medications prior to visit.     Per HPI unless specifically indicated in ROS section below Review of Systems  Objective:  BP 120/72 (BP Location: Left Arm, Patient Position: Sitting, Cuff Size: Large)   Pulse 68   Temp 97.9 F (36.6 C)   Ht '5\' 9"'$  (1.753 m)   Wt 198 lb (89.8 kg)   SpO2 97%   BMI 29.24 kg/m   Wt Readings from Last 3 Encounters:  04/12/22  198 lb (89.8 kg)  01/14/22 196 lb 8 oz (89.1 kg)  10/22/21 199 lb 6 oz (90.4 kg)      Physical Exam Vitals and nursing note reviewed.  Constitutional:      Appearance: Normal appearance. He is not ill-appearing.  Cardiovascular:     Rate and Rhythm: Normal rate and regular rhythm.     Pulses: Normal pulses.     Heart sounds: Normal heart sounds. No murmur heard. Pulmonary:     Effort: Pulmonary effort is normal. No respiratory distress.     Breath sounds: Normal breath sounds. No wheezing, rhonchi or rales.  Abdominal:     General: Bowel sounds are normal. There is no distension.     Palpations: Abdomen is soft. There is no hepatomegaly, splenomegaly or mass.     Tenderness: There is no abdominal tenderness. There is no guarding or rebound.     Hernia: No hernia is present.  Musculoskeletal:     Right lower leg: No edema.     Left lower leg: No edema.     Comments:  2+ DP on right Diminished DP on left  Skin:    General: Skin is warm and dry.     Findings: Rash present.     Comments: Papular pruritic rash to bilateral medial elbows as well as to bilateral sides of abdomen   Neurological:     Mental Status: He is alert.  Psychiatric:        Mood and Affect: Mood normal.        Behavior: Behavior normal.       Results for orders placed or performed in visit on 04/12/22  POCT Urinalysis Dipstick (Automated)  Result Value Ref Range   Color, UA Yellow    Clarity, UA clear    Glucose, UA Negative Negative   Bilirubin, UA negative    Ketones, UA negative    Spec Grav, UA 1.020 1.010 - 1.025   Blood, UA negative    pH, UA 6.0 5.0 - 8.0   Protein, UA Negative Negative   Urobilinogen, UA 0.2 0.2 or 1.0 E.U./dL   Nitrite, UA negative    Leukocytes, UA Negative Negative    Assessment & Plan:   Problem List Items Addressed This Visit   None Visit Diagnoses     Urinary frequency       Relevant Orders   POCT Urinalysis Dipstick (Automated) (Completed)   Need for  influenza vaccination       Relevant Orders   Flu Vaccine QUAD High Dose(Fluad) (Completed)        No orders of the defined types were placed in this encounter.  Orders Placed This Encounter  Procedures   Flu Vaccine QUAD High Dose(Fluad)   POCT Urinalysis Dipstick (Automated)     ***Follow up plan: No follow-ups on file.  Ria Bush, MD

## 2022-04-12 NOTE — Patient Instructions (Addendum)
Flu shot today.  Right knee xray today  Stay off meloxicam for at least another week. If you do restart meloxicam, take only 1/2 tablet daily.  If rash doesn't improve off meloxicam, try cutting amlodipine down to 1/2 tablet daily as well ('5mg'$  total).  May retake prednisone pills as well as topical steroid cream for rash.  We will do artery circulation evaluation in [place].  Return in 3 months for follow up visit

## 2022-04-13 DIAGNOSIS — M1120 Other chondrocalcinosis, unspecified site: Secondary | ICD-10-CM | POA: Insufficient documentation

## 2022-04-13 DIAGNOSIS — M1611 Unilateral primary osteoarthritis, right hip: Secondary | ICD-10-CM | POA: Insufficient documentation

## 2022-04-13 MED ORDER — COLCHICINE 0.6 MG PO TABS: 0.6000 mg | ORAL_TABLET | Freq: Every day | ORAL | 1 refills | Status: DC | PRN

## 2022-04-13 NOTE — Assessment & Plan Note (Signed)
Saw ortho, started on meloxicam '15mg'$  daily - advised max take 7.'5mg'$  and use sparingly due to HTN hx. Anticipate will need hip replacement, he is hesitant to pursue this.

## 2022-04-13 NOTE — Assessment & Plan Note (Signed)
Well controlled on current regimen - continue.

## 2022-04-13 NOTE — Assessment & Plan Note (Addendum)
Describing R leg pain from hip to knee which could be hip arthritis, but also knee to ankle. ?knee arthritis related - check knee films.

## 2022-04-13 NOTE — Assessment & Plan Note (Addendum)
R knee xray with chondrocalcinosis. Reviewing chart, he also had hand xray 2021 with chondrocalcinosis. Anticipate calcium pyrophosphate disease. Start colchicine PRN joint pain.  Prednisone course should also help right leg pain.

## 2022-04-13 NOTE — Assessment & Plan Note (Addendum)
Recurrent skin rash to trunk and extremities.  Previously saw derm thought drug rash that did improve off ARB.  ?amlodipine related as it did return on higher amlodipine dose.  Will hold meloxicam to see if rash improves off this. If ongoing, consider lowering amlodipine dose.  Start steroid cream. Consider derm eval if ongoing.  Restart prednisone course - caution in diabetic.

## 2022-04-13 NOTE — Assessment & Plan Note (Signed)
Diminished L pedal pulse, R pedal pulse stable. Has not has ABIs- will order in [place].

## 2022-04-22 ENCOUNTER — Encounter: Payer: Self-pay | Admitting: Family Medicine

## 2022-04-25 ENCOUNTER — Ambulatory Visit (HOSPITAL_COMMUNITY)
Admission: RE | Admit: 2022-04-25 | Discharge: 2022-04-25 | Disposition: A | Discharge status: Home / Self Care | Payer: Medicare Other | Source: Ambulatory Visit | Attending: Cardiology | Admitting: Cardiology

## 2022-04-25 DIAGNOSIS — R0989 Other specified symptoms and signs involving the circulatory and respiratory systems: Secondary | ICD-10-CM | POA: Diagnosis not present

## 2022-05-06 ENCOUNTER — Other Ambulatory Visit: Payer: Self-pay | Admitting: Family Medicine

## 2022-05-06 DIAGNOSIS — Z23 Encounter for immunization: Secondary | ICD-10-CM | POA: Diagnosis not present

## 2022-05-06 NOTE — Telephone Encounter (Signed)
Message from pharmacy:  REQUEST FOR 90 DAYS PRESCRIPTION. 

## 2022-05-09 NOTE — Telephone Encounter (Signed)
Spoke to patient by telephone and was advised that he is not taking Colchicine because of the side effects of diarrhea. Patient stated that he does not know why it was requested. Patient stated that he will let his pharmacy know to take that off of his medication list.

## 2022-05-09 NOTE — Telephone Encounter (Signed)
Pt reported last month he was unable to tolerate colchicine so he stopped. Can we verify he's no longer on this medication? Thanks.

## 2022-05-21 ENCOUNTER — Emergency Department (HOSPITAL_COMMUNITY)
Admission: EM | Admit: 2022-05-21 | Discharge: 2022-05-21 | Disposition: A | Discharge status: Home / Self Care | Payer: Medicare Other | Attending: Emergency Medicine | Admitting: Emergency Medicine

## 2022-05-21 DIAGNOSIS — Z20822 Contact with and (suspected) exposure to covid-19: Secondary | ICD-10-CM | POA: Diagnosis not present

## 2022-05-21 DIAGNOSIS — I10 Essential (primary) hypertension: Secondary | ICD-10-CM | POA: Diagnosis not present

## 2022-05-21 DIAGNOSIS — E119 Type 2 diabetes mellitus without complications: Secondary | ICD-10-CM

## 2022-05-21 DIAGNOSIS — R5383 Other fatigue: Secondary | ICD-10-CM | POA: Diagnosis present

## 2022-05-21 DIAGNOSIS — E1165 Type 2 diabetes mellitus with hyperglycemia: Secondary | ICD-10-CM | POA: Diagnosis not present

## 2022-05-21 DIAGNOSIS — Z7984 Long term (current) use of oral hypoglycemic drugs: Secondary | ICD-10-CM | POA: Insufficient documentation

## 2022-05-21 DIAGNOSIS — Z79899 Other long term (current) drug therapy: Secondary | ICD-10-CM | POA: Diagnosis not present

## 2022-05-21 DIAGNOSIS — N179 Acute kidney failure, unspecified: Secondary | ICD-10-CM

## 2022-05-21 DIAGNOSIS — Z7982 Long term (current) use of aspirin: Secondary | ICD-10-CM | POA: Insufficient documentation

## 2022-05-21 DIAGNOSIS — E871 Hypo-osmolality and hyponatremia: Secondary | ICD-10-CM | POA: Insufficient documentation

## 2022-05-21 DIAGNOSIS — R739 Hyperglycemia, unspecified: Secondary | ICD-10-CM

## 2022-05-21 LAB — URINALYSIS, ROUTINE W REFLEX MICROSCOPIC
Bilirubin Urine: NEGATIVE
Glucose, UA: >=500 mg/dL — AB
Hgb urine dipstick: NEGATIVE
Ketones, ur: 20 mg/dL — AB
Leukocytes,Ua: NEGATIVE
Nitrite: NEGATIVE
Protein, ur: NEGATIVE mg/dL
Specific Gravity, Urine: 1.02 (ref 1.005–1.030)
pH: 5 (ref 5.0–8.0)

## 2022-05-21 LAB — CBC
HCT: 45.8 % (ref 39.0–52.0)
Hemoglobin: 15.4 g/dL (ref 13.0–17.0)
MCH: 31.2 pg (ref 26.0–34.0)
MCHC: 33.6 g/dL (ref 30.0–36.0)
MCV: 92.7 fL (ref 80.0–100.0)
Platelets: 303 10*3/uL (ref 150–400)
RBC: 4.94 MIL/uL (ref 4.22–5.81)
RDW: 12.7 % (ref 11.5–15.5)
WBC: 7.9 10*3/uL (ref 4.0–10.5)
nRBC: 0 % (ref 0.0–0.2)

## 2022-05-21 LAB — CBG MONITORING, ED: Glucose-Capillary: 324 mg/dL — ABNORMAL HIGH (ref 70–99)

## 2022-05-21 LAB — BASIC METABOLIC PANEL
Anion gap: 21 — ABNORMAL HIGH (ref 5–15)
Anion gap: 15 (ref 5–15)
BUN: 34 mg/dL — ABNORMAL HIGH (ref 8–23)
BUN: 28 mg/dL — ABNORMAL HIGH (ref 8–23)
CO2: 21 mmol/L — ABNORMAL LOW (ref 22–32)
CO2: 22 mmol/L (ref 22–32)
Calcium: 9.6 mg/dL (ref 8.9–10.3)
Calcium: 9.1 mg/dL (ref 8.9–10.3)
Chloride: 87 mmol/L — ABNORMAL LOW (ref 98–111)
Chloride: 101 mmol/L (ref 98–111)
Creatinine, Ser: 2.13 mg/dL — ABNORMAL HIGH (ref 0.61–1.24)
Creatinine, Ser: 1.62 mg/dL — ABNORMAL HIGH (ref 0.61–1.24)
GFR, Estimated: 31 mL/min — ABNORMAL LOW (ref >60)
GFR, Estimated: 44 mL/min — ABNORMAL LOW (ref >60)
Glucose, Bld: 671 mg/dL (ref 70–99)
Glucose, Bld: 311 mg/dL — ABNORMAL HIGH (ref 70–99)
Potassium: 5.1 mmol/L (ref 3.5–5.1)
Potassium: 4.4 mmol/L (ref 3.5–5.1)
Sodium: 129 mmol/L — ABNORMAL LOW (ref 135–145)
Sodium: 138 mmol/L (ref 135–145)

## 2022-05-21 LAB — RESP PANEL BY RT-PCR (FLU A&B, COVID) ARPGX2
Influenza A by PCR: NEGATIVE
Influenza B by PCR: NEGATIVE
SARS Coronavirus 2 by RT PCR: NEGATIVE

## 2022-05-21 LAB — TROPONIN I (HIGH SENSITIVITY): Troponin I (High Sensitivity): 20 ng/L — ABNORMAL HIGH (ref <18)

## 2022-05-21 MED ORDER — SODIUM CHLORIDE 0.9 % IV BOLUS
2000.0000 mL | Freq: Once | INTRAVENOUS | Status: AC
  Administered 2022-05-21: 2000 mL via INTRAVENOUS

## 2022-05-21 MED ORDER — INSULIN ASPART 100 UNIT/ML IJ SOLN
15.0000 [IU] | Freq: Once | INTRAMUSCULAR | Status: AC
  Administered 2022-05-21: 15 [IU] via INTRAVENOUS

## 2022-05-21 MED ORDER — HYDROXYZINE HCL 25 MG PO TABS: 25.0000 mg | ORAL_TABLET | Freq: Three times a day (TID) | ORAL | 0 refills | Status: DC | PRN

## 2022-05-21 MED ORDER — METFORMIN HCL 1000 MG PO TABS: 1000.0000 mg | ORAL_TABLET | Freq: Two times a day (BID) | ORAL | 2 refills | Status: DC

## 2022-05-21 NOTE — ED Triage Notes (Signed)
Patient complains of fatigue and polyuria that started two weeks ago, patient states the symptoms started after he was prescribed prednisone for his leg. Patients states he was seen again by PCP and states the prednisone dose was reduced and he was also started on colchicine but the symptoms continue. Patient is alert, oriented, and in no apparent distress at this time.

## 2022-05-21 NOTE — Discharge Instructions (Addendum)
You were diagnosed with a diabetes complication today with high blood sugars and dehydration.  You are given fluids and insulin in the ER.  It is very important that you watch her diet at home, trying to reduce her sugar and carb intake.  Your doctor's office also needs to manage and monitor your blood sugars.  For now I started you on metformin 1000 mg twice a day, she will take beginning tomorrow morning.  It is very important that your doctors office rechecks your blood work, including your kidney function this week.  You are showing signs of dehydration in the ER.  If you have worsening dehydration, vomiting, confusion, or any other emergency concerns at home, please return to the ER.  Thank you please drink lots and lots and lots of water.

## 2022-05-21 NOTE — ED Provider Notes (Signed)
[facility] EMERGENCY DEPARTMENT Provider Note   CSN: [id number] Arrival date & time: 05/21/22  1244     History  Chief Complaint  Patient presents with   Fatigue    Gregory Abbott is a 76 y.o. male with a history of high blood pressure, presenting to emergency department with concern for generalized fatigue.  The patient reports that he was put on prednisone for several days about 2 to 3 weeks ago by his doctor for sciatica or a gout flareup in his lower extremity.  He says since he was on the prednisone he began having excessive thirst and urination and feels extremely fatigued.  His doctor contacted him and told him his blood sugars are very high.  His doctor has been telling him he may need to be on medicine for diabetes for "several months".  He does not currently take any diabetic medications.  Per chart review the patient an A1c checked in April and it was 7.5, trending upwards over the past 2 years.  He subsequently had a point-of-care A1c level checked in July of this year, which was 7.2.  HPI     Home Medications Prior to Admission medications   Medication Sig Start Date End Date Taking? Authorizing Provider  hydrOXYzine (ATARAX) 25 MG tablet Take 1 tablet (25 mg total) by mouth every 8 (eight) hours as needed for up to 21 doses. 05/21/22  Yes Wyvonnia Dusky, MD  metFORMIN (GLUCOPHAGE) 1000 MG tablet Take 1 tablet (1,000 mg total) by mouth 2 (two) times daily. 05/21/22 06/20/22 Yes Wyvonnia Dusky, MD  albuterol (VENTOLIN HFA) 108 (90 Base) MCG/ACT inhaler TAKE 2 PUFFS BY MOUTH EVERY 6 HOURS AS NEEDED FOR WHEEZE OR SHORTNESS OF BREATH 03/14/22   Ria Bush, MD  amLODipine (NORVASC) 10 MG tablet Take 1 tablet (10 mg total) by mouth daily. 01/14/22   Ria Bush, MD  aspirin EC 81 MG tablet Take 81 mg by mouth every other day.    [provider]  atenolol (TENORMIN) 100 MG tablet TAKE 1 TABLET (100 MG TOTAL) BY MOUTH DAILY AFTER  BREAKFAST. 11/15/21   Ria Bush, MD  Cholecalciferol (VITAMIN D3) 25 MCG (1000 UT) CAPS Take 1 capsule (1,000 Units total) by mouth daily. Patient taking differently: Take 1 capsule by mouth daily. Takes about twice a month 06/26/19   Ria Bush, MD  colchicine 0.6 MG tablet Take 1 tablet (0.6 mg total) by mouth daily as needed (joint pains). 04/13/22   Ria Bush, MD  hydrochlorothiazide (HYDRODIURIL) 25 MG tablet TAKE 1 TABLET (25 MG TOTAL) BY MOUTH DAILY. 11/15/21   Ria Bush, MD  loratadine (CLARITIN) 10 MG tablet Take 1 tablet (10 mg total) by mouth daily. Patient taking differently: Take 10 mg by mouth daily. As needed 05/10/16   Ria Bush, MD  meloxicam (MOBIC) 15 MG tablet Take 15 mg by mouth daily. 01/12/22   [provider]  omeprazole (PRILOSEC) 40 MG capsule TAKE 1 CAPSULE (40 MG TOTAL) BY MOUTH DAILY. 11/15/21   Ria Bush, MD  predniSONE (DELTASONE) 20 MG tablet Take two tablets daily for 3 days followed by one tablet daily for 4 days 04/12/22   Ria Bush, MD  rosuvastatin (CRESTOR) 5 MG tablet Take 1 tablet (5 mg total) by mouth daily. 01/14/22   Ria Bush, MD  sildenafil (REVATIO) 20 MG tablet TAKE 2-5 TABLETS BY MOUTH EACH DAY AS NEEDED (RELATIONS) 02/23/22   Ria Bush, MD  triamcinolone cream (KENALOG) 0.1 %  Apply topically 2 (two) times daily. 04/12/22   Ria Bush, MD  TURMERIC PO Take by mouth daily. Patient not taking: Reported on 04/12/2022    [provider]      Allergies    Penicillins, Azithromycin, and Losartan    Review of Systems   Review of Systems  Physical Exam Updated Vital Signs BP 116/61   Pulse 67   Temp (!) 97.5 F (36.4 C)   Resp 19   SpO2 91%  Physical Exam  ED Results / Procedures / Treatments   Labs (all labs ordered are listed, but only abnormal results are displayed) Labs Reviewed  BASIC METABOLIC PANEL - Abnormal; Notable for the following  components:      Result Value   Sodium 129 (*)    Chloride 87 (*)    CO2 21 (*)    Glucose, Bld 671 (*)    BUN 34 (*)    Creatinine, Ser 2.13 (*)    GFR, Estimated 31 (*)    Anion gap 21 (*)    All other components within normal limits  URINALYSIS, ROUTINE W REFLEX MICROSCOPIC - Abnormal; Notable for the following components:   Glucose, UA >=500 (*)    Ketones, ur 20 (*)    Bacteria, UA RARE (*)    All other components within normal limits  BASIC METABOLIC PANEL - Abnormal; Notable for the following components:   Glucose, Bld 311 (*)    BUN 28 (*)    Creatinine, Ser 1.62 (*)    GFR, Estimated 44 (*)    All other components within normal limits  CBG MONITORING, ED - Abnormal; Notable for the following components:   Glucose-Capillary 324 (*)    All other components within normal limits  TROPONIN I (HIGH SENSITIVITY) - Abnormal; Notable for the following components:   Troponin I (High Sensitivity) 20 (*)    All other components within normal limits  RESP PANEL BY RT-PCR (FLU A&B, COVID) ARPGX2  CBC    EKG EKG Interpretation  Date/Time:  Saturday May 21 2022 13:32:36 EST Ventricular Rate:  69 PR Interval:  144 QRS Duration: 92 QT Interval:  404 QTC Calculation: 432 R Axis:   72 Text Interpretation: Normal sinus rhythm Normal ECG When compared with ECG of 06-Nov-2013 23:05, PREVIOUS ECG IS PRESENT Confirmed by Octaviano Glow [phone]) on 05/21/2022 3:20:34 PM  Radiology No results found.  Procedures Procedures    Medications Ordered in ED Medications  sodium chloride 0.9 % bolus 2,000 mL (0 mLs Intravenous Stopped 05/21/22 1926)  insulin aspart (novoLOG) injection 15 Units (15 Units Intravenous Given 05/21/22 1603)    ED Course/ Medical Decision Making/ A&P Clinical Course as of 05/21/22 2217  Sat May 21, 2022  1750 Glucose-Capillary(!): 324 [MT]  3846 Nurse reports he had a redraw BMP [MT]    Clinical Course User Index [MT] Tinzlee Craker, Carola Rhine, MD                            Medical Decision Making Amount and/or Complexity of Data Reviewed Labs: ordered. Decision-making details documented in ED Course.  Risk Prescription drug management.   This patient presents to the ED with concern for fatigue, hyperglycemia. This involves an extensive number of treatment options, and is a complaint that carries with it a high risk of complications and morbidity.  The differential diagnosis includes new onset diabetes versus HHS versus UTI versus anemia versus metabolic derangement versus other  Co-morbidities that complicate the patient evaluation: History of poorly controlled blood sugars and diabetes, and prednisone use at high risk for diabetic complications  Additional history obtained from patient's wife at bedside  External records from outside source obtained and reviewed including A1c levels as noted above  I ordered and personally interpreted labs.  The pertinent results include: Patient has hyperglycemia glucose 671, mild developed AKI with BUN of 34 and creatinine 2.1, suspected to be due to hydration, glucose in the urine.  COVID and flu are negative.  He has an anion gap of 21.  Also some pseudohyponatremia sodium 129.  No anemia or leukocytosis.  Troponin is unremarkable 20, no chest pain, doubt ACS   The patient was maintained on a cardiac monitor.  I personally viewed and interpreted the cardiac monitored which showed an underlying rhythm of: Sinus rhythm  Per my interpretation the patient's ECG shows sinus rhythm with no acute ischemic findings  I ordered medication including IV fluids for hydration and IV insulin for hyperglycemia  I have reviewed the patients home medicines and have made adjustments as needed  Test Considered: Low suspicion for acute PE, sepsis  After the interventions noted above, I reevaluated the patient and found that they have: improved  Repeat labs show that his anion gap is closed and his kidney function  and BUN significantly improved.  I discussed with the patient the need for continued aggressive drinking and water intake.  Avoiding soda and sugary beverages.  Dispostion:  After consideration of the diagnostic results and the patients response to treatment, I feel that the patent would benefit from close outpatient PCP follow-up..         Final Clinical Impression(s) / ED Diagnoses Final diagnoses:  Hyperglycemia  AKI (acute kidney injury) ([place])  Type 2 diabetes mellitus without complication, without long-term current use of insulin ([place])    Rx / DC Orders ED Discharge Orders          Ordered    metFORMIN (GLUCOPHAGE) 1000 MG tablet  2 times daily        05/21/22 2123    hydrOXYzine (ATARAX) 25 MG tablet  Every 8 hours PRN        05/21/22 2123              Wyvonnia Dusky, MD 05/21/22 2218

## 2022-05-22 ENCOUNTER — Telehealth: Payer: Self-pay | Admitting: Family Medicine

## 2022-05-22 NOTE — Telephone Encounter (Signed)
Please call to schedule ER f/u visit for this week - seen over the weekend with hyperglycemia.

## 2022-05-23 NOTE — Telephone Encounter (Signed)
Pt call back and was given appt for 11/28 /23

## 2022-05-23 NOTE — Telephone Encounter (Signed)
Called and lvm for patient call us back to make an appt with Dr Darnell Level this  week.

## 2022-05-24 ENCOUNTER — Telehealth: Payer: Self-pay | Admitting: *Deleted

## 2022-05-24 NOTE — Telephone Encounter (Signed)
     Patient  visit on 05/21/2022  at [facility]  was for weakness   Have you been able to follow up with your primary care physician? Has an appt with PCP tomroow still feeling weak and listless The patient was or was not able to obtain any needed medicine or equipment.  Are there diet recommendations that you are having difficulty following?  Patient expresses understanding of discharge instructions and education provided has no other needs at this time.   [place] [phone] [address] , [place] [address] Email : [email or web address]

## 2022-05-25 ENCOUNTER — Encounter: Payer: Self-pay | Admitting: Family Medicine

## 2022-05-25 ENCOUNTER — Ambulatory Visit (INDEPENDENT_AMBULATORY_CARE_PROVIDER_SITE_OTHER): Payer: Medicare Other | Admitting: Family Medicine

## 2022-05-25 VITALS — BP 114/68 | HR 80 | Ht 69.0 in | Wt 175.0 lb

## 2022-05-25 DIAGNOSIS — I700 Atherosclerosis of aorta: Secondary | ICD-10-CM | POA: Diagnosis not present

## 2022-05-25 DIAGNOSIS — R21 Rash and other nonspecific skin eruption: Secondary | ICD-10-CM

## 2022-05-25 DIAGNOSIS — E1169 Type 2 diabetes mellitus with other specified complication: Secondary | ICD-10-CM

## 2022-05-25 DIAGNOSIS — R0989 Other specified symptoms and signs involving the circulatory and respiratory systems: Secondary | ICD-10-CM

## 2022-05-25 DIAGNOSIS — M79604 Pain in right leg: Secondary | ICD-10-CM | POA: Diagnosis not present

## 2022-05-25 DIAGNOSIS — E785 Hyperlipidemia, unspecified: Secondary | ICD-10-CM | POA: Diagnosis not present

## 2022-05-25 DIAGNOSIS — N179 Acute kidney failure, unspecified: Secondary | ICD-10-CM

## 2022-05-25 DIAGNOSIS — M1120 Other chondrocalcinosis, unspecified site: Secondary | ICD-10-CM | POA: Diagnosis not present

## 2022-05-25 DIAGNOSIS — Z9081 Acquired absence of spleen: Secondary | ICD-10-CM | POA: Diagnosis not present

## 2022-05-25 DIAGNOSIS — M1611 Unilateral primary osteoarthritis, right hip: Secondary | ICD-10-CM

## 2022-05-25 LAB — RENAL FUNCTION PANEL
Albumin: 4.1 g/dL (ref 3.5–5.2)
BUN: 23 mg/dL (ref 6–23)
CO2: 27 mEq/L (ref 19–32)
Calcium: 10.2 mg/dL (ref 8.4–10.5)
Chloride: 92 mEq/L — ABNORMAL LOW (ref 96–112)
Creatinine, Ser: 1.59 mg/dL — ABNORMAL HIGH (ref 0.40–1.50)
GFR: 41.78 mL/min — ABNORMAL LOW (ref >60.00)
Glucose, Bld: 440 mg/dL — ABNORMAL HIGH (ref 70–99)
Phosphorus: 4.4 mg/dL (ref 2.3–4.6)
Potassium: 4.7 mEq/L (ref 3.5–5.1)
Sodium: 134 mEq/L — ABNORMAL LOW (ref 135–145)

## 2022-05-25 LAB — GLUCOSE, POCT (MANUAL RESULT ENTRY): POC Glucose: 375 mg/dl — AB (ref 70–99)

## 2022-05-25 MED ORDER — ACCU-CHEK GUIDE W/DEVICE KIT: PACK | 0 refills | Status: AC

## 2022-05-25 MED ORDER — GLIPIZIDE 5 MG PO TABS: 5.0000 mg | ORAL_TABLET | Freq: Every day | ORAL | 11 refills | Status: DC

## 2022-05-25 MED ORDER — ACCU-CHEK FASTCLIX LANCETS MISC: 1 refills | Status: DC

## 2022-05-25 NOTE — Patient Instructions (Addendum)
We will refer you to vascular surgery for further evaluation of right leg pain.  Fingerstick check today.  Labs today.  Drop metformin to 1/2 tablet ('500mg'$ ) twice daily. Add on glipizide '5mg'$  twice daily with breakfast and dinner. Always take glipizide with food.  Increase water intake.  Return in 3-4 weeks for follow up visit.  Use desitin barrier cream to bottom in am, use lotrimin antifungal cream to bottom in PM

## 2022-05-25 NOTE — Progress Notes (Signed)
Patient ID: Gregory Abbott, male    DOB: 1945/08/21, 76 y.o.   MRN: [id number]  This visit was conducted in person.  BP 114/68   Pulse 80   Ht _0  (1.753 m)   Wt 175 lb (79.4 kg)   SpO2 96%   BMI 25.84 kg/m   BP Readings from Last 3 Encounters:  05/25/22 114/68  05/21/22 (!) 145/70  04/12/22 120/72   CC: ER f/u visit  Subjective:   HPI: Gregory Abbott is a 76 y.o. male presenting on 05/25/2022 for ER follow-up (Pt stated--visited er on 05/22/22, due to blood sugar was leveled at 600. got blood sugar dropped to 300 and still feeling lethargic.)   Recent ER visit for fatigue. Found to be markedly hyperglycemic to 600, with AKI Cr up to 2.13 (GFR 31), treated with 2L IVF and insulin aspart 15u. Discharge Cr down to 1.6 (GFR 44), glu down to 300s.  ER note reviewed.   This was in setting of receiving prednisone course for presumed pseudogout flare 04/12/2022. He had difficulty tolerating colchicine due to diarrhea. Diarrhea flared hemorrhoids - he's treated with topical PrepH.   He started feeling worse after this - rapidly losing weight - down 20 lbs  Currently taking amlodipine 61m daily.  No blurry vision or paresthesias.  No abd pain, nausea, vomiting, dysphagia.  Notes some new early satiety.   Continued R leg pain from thigh down to shin. No numbness or weakness of leg. No buttock or back pain. Known severe R hip osteoarthritis.  Taking tylenol 5034mfor pain until he ran out. He tried wife's etodolac 50060m     Relevant past medical, surgical, family and social history reviewed and updated as indicated. Interim medical history since our last visit reviewed. Allergies and medications reviewed and updated. Outpatient Medications Prior to Visit  Medication Sig Dispense Refill   albuterol (VENTOLIN HFA) 108 (90 Base) MCG/ACT inhaler TAKE 2 PUFFS BY MOUTH EVERY 6 HOURS AS NEEDED FOR WHEEZE OR SHORTNESS OF BREATH 8.5 each 0   aspirin EC 81 MG tablet Take 81 mg by mouth  every other day.     atenolol (TENORMIN) 100 MG tablet TAKE 1 TABLET (100 MG TOTAL) BY MOUTH DAILY AFTER BREAKFAST. 90 tablet 3   Cholecalciferol (VITAMIN D3) 25 MCG (1000 UT) CAPS Take 1 capsule (1,000 Units total) by mouth daily. (Patient taking differently: Take 1 capsule by mouth daily. Takes about twice a month) 30 capsule    hydrochlorothiazide (HYDRODIURIL) 25 MG tablet TAKE 1 TABLET (25 MG TOTAL) BY MOUTH DAILY. 90 tablet 3   hydrOXYzine (ATARAX) 25 MG tablet Take 1 tablet (25 mg total) by mouth every 8 (eight) hours as needed for up to 21 doses. 21 tablet 0   loratadine (CLARITIN) 10 MG tablet Take 1 tablet (10 mg total) by mouth daily. (Patient taking differently: Take 10 mg by mouth daily. As needed)     omeprazole (PRILOSEC) 40 MG capsule TAKE 1 CAPSULE (40 MG TOTAL) BY MOUTH DAILY. 90 capsule 3   rosuvastatin (CRESTOR) 5 MG tablet Take 1 tablet (5 mg total) by mouth daily. 90 tablet 3   sildenafil (REVATIO) 20 MG tablet TAKE 2-5 TABLETS BY MOUTH EACH DAY AS NEEDED (RELATIONS) 30 tablet 3   triamcinolone cream (KENALOG) 0.1 % Apply topically 2 (two) times daily. 60 g 1   TURMERIC PO Take by mouth daily.     amLODipine (NORVASC) 10 MG tablet Take 1 tablet (10  mg total) by mouth daily. 90 tablet 3   colchicine 0.6 MG tablet Take 1 tablet (0.6 mg total) by mouth daily as needed (joint pains). 30 tablet 1   meloxicam (MOBIC) 15 MG tablet Take 15 mg by mouth daily.     metFORMIN (GLUCOPHAGE) 1000 MG tablet Take 1 tablet (1,000 mg total) by mouth 2 (two) times daily. 60 tablet 2   predniSONE (DELTASONE) 20 MG tablet Take two tablets daily for 3 days followed by one tablet daily for 4 days 10 tablet 0   amLODipine (NORVASC) 10 MG tablet Take 0.5 tablets (5 mg total) by mouth daily.     metFORMIN (GLUCOPHAGE) 1000 MG tablet Take 0.5 tablets (500 mg total) by mouth 2 (two) times daily.     No facility-administered medications prior to visit.     Per HPI unless specifically indicated in ROS  section below Review of Systems  Objective:  BP 114/68   Pulse 80   Ht _0  (1.753 m)   Wt 175 lb (79.4 kg)   SpO2 96%   BMI 25.84 kg/m   Wt Readings from Last 3 Encounters:  05/25/22 175 lb (79.4 kg)  04/12/22 198 lb (89.8 kg)  01/14/22 196 lb 8 oz (89.1 kg)      Physical Exam Vitals and nursing note reviewed.  Constitutional:      Appearance: Normal appearance. He is not ill-appearing.  HENT:     Head: Normocephalic and atraumatic.     Mouth/Throat:     Mouth: Mucous membranes are moist.     Pharynx: Oropharynx is clear. No oropharyngeal exudate or posterior oropharyngeal erythema.  Eyes:     Extraocular Movements: Extraocular movements intact.     Pupils: Pupils are equal, round, and reactive to light.  Cardiovascular:     Rate and Rhythm: Normal rate and regular rhythm.     Pulses: Normal pulses.     Heart sounds: Normal heart sounds. No murmur heard. Pulmonary:     Effort: Pulmonary effort is normal. No respiratory distress.     Breath sounds: Normal breath sounds. No wheezing, rhonchi or rales.  Abdominal:     General: Bowel sounds are normal. There is no distension.     Palpations: Abdomen is soft. There is no mass.     Tenderness: There is no abdominal tenderness. There is no guarding or rebound.     Hernia: No hernia is present.  Musculoskeletal:        General: Normal range of motion.     Cervical back: Normal range of motion and neck supple.     Right lower leg: No edema.     Left lower leg: No edema.     Comments: Diminished pulses BLE  Skin:    General: Skin is warm and dry.     Findings: Rash present.     Comments: Skin maceration to midline sacrum  Neurological:     Mental Status: He is alert.  Psychiatric:        Mood and Affect: Mood normal.        Behavior: Behavior normal.       Results for orders placed or performed in visit on 05/25/22  Renal function panel  Result Value Ref Range   Sodium 134 (L) 135 - 145 mEq/L   Potassium 4.7 3.5  - 5.1 mEq/L   Chloride 92 (L) 96 - 112 mEq/L   CO2 27 19 - 32 mEq/L   Albumin 4.1 3.5 - 5.2  g/dL   BUN 23 6 - 23 mg/dL   Creatinine, Ser 1.59 (H) 0.40 - 1.50 mg/dL   Glucose, Bld 440 (H) 70 - 99 mg/dL   Phosphorus 4.4 2.3 - 4.6 mg/dL   GFR 41.78 (L) >60.00 mL/min   Calcium 10.2 8.4 - 10.5 mg/dL  POCT glucose (manual entry)  Result Value Ref Range   POC Glucose 375 (A) 70 - 99 mg/dl   Lab Results  Component Value Date   HGBA1C 7.2 (A) 01/14/2022    Lab Results  Component Value Date   LABURIC 6.8 01/20/2020   Lab Results  Component Value Date   ALT 20 04/12/2022   AST 18 04/12/2022   ALKPHOS 54 04/12/2022   BILITOT 0.6 04/12/2022    Lab Results  Component Value Date   CHOL 174 10/15/2021   HDL 46.30 10/15/2021   LDLCALC 100 (H) 10/15/2021   LDLDIRECT 143.0 03/30/2015   TRIG 142.0 10/15/2021   CHOLHDL 4 10/15/2021    Lab Results  Component Value Date   TSH 1.74 10/15/2021    Assessment & Plan:   Problem List Items Addressed This Visit       Unprioritized   Skin rash    Skin rash midline sacrum anticipate component of fungal infection given hyperglycemia - rec lotrimin antifungal and barrier cream. Reassess at f/u visit.       Type 2 diabetes mellitus with other specified complication ([place]) - Primary    Diabetes 2 with hyperglycemia, acutely worse after steroid course 04/2022, with glu up to 600s in ER, treated with IVF and starting metformin 102m bid. Given kidney function, recommend limiting metformin dose to 5011mBID. Will add glipizide 3m73mID with meals, reviewed need to take with meal to avoid hypoglycemia.  CMA teaching for glucometer today, he will check on insurance-preferred glucometer brand and let us [place] to send in. Will send in Acc[employer]aygo interim. RTC 3-4 wks f/u visit.       Relevant Medications   metFORMIN (GLUCOPHAGE) 1000 MG tablet   glipiZIDE (GLUCOTROL) 5 MG tablet   Other Relevant Orders   Renal function panel (Completed)    POCT glucose (manual entry) (Completed)   Ambulatory referral to Vascular Surgery   Hyperlipidemia associated with type 2 diabetes mellitus ([place]  On crestor 3mg60mily. Consider titration to goal LDL <70.  The 10-year ASCVD risk score (Arnett DK, et al., 2019) is: 32.1%   Values used to calculate the score:     Age: 81 y53rs     Sex: Male     Is Non-Hispanic African American: Yes     Diabetic: Yes     Tobacco smoker: No     Systolic Blood Pressure: 114 109g     Is BP treated: Yes     HDL Cholesterol: 46.3 mg/dL     Total Cholesterol: 174 mg/dL       Relevant Medications   metFORMIN (GLUCOPHAGE) 1000 MG tablet   amLODipine (NORVASC) 10 MG tablet   glipiZIDE (GLUCOTROL) 5 MG tablet   Right leg pain    Ongoing leg pain in known hip osteoarthritis however pain not fully explained by OA.  Abnormal ABIs - will refer to vascular to help r/o arterial insufficiency.       Relevant Orders   Ambulatory referral to Vascular Surgery   Diminished pulses in lower extremity    ABI 04/2022 - non-compressible ABIs bilaterally, abnormal R TBI. Will offer VVS eval.  Refer to  VVS for further evaluation.       Osteoarthritis of right hip    H/o this, saw ortho treated with NSAIDs.  Consider return to ortho after completes vascular evaluation.       Calcium pyrophosphate deposition disease (CPPD)   Atherosclerosis of aorta (HCC)   Relevant Medications   amLODipine (NORVASC) 10 MG tablet   Acute kidney injury ([place])    Thought hyperglycemia-induced. Anticipate improvement as sugar control improves.  Drop metformin to 521m BID.  Encouraged good water intake.         Meds ordered this encounter  Medications   glipiZIDE (GLUCOTROL) 5 MG tablet    Sig: Take 1 tablet (5 mg total) by mouth daily before breakfast.    Dispense:  30 tablet    Refill:  11   Blood Glucose Monitoring Suppl (ACCU-CHEK GUIDE) w/Device KIT    Sig: Use as directed to check sugars daily    Dispense:  1 kit     Refill:  0   Accu-Chek FastClix Lancets MISC    Sig: Use as directed to check sugars daily    Dispense:  100 each    Refill:  1   Orders Placed This Encounter  Procedures   Renal function panel   Ambulatory referral to Vascular Surgery    Referral Priority:   Routine    Referral Type:   Surgical    Referral Reason:   Specialty Services Required    Requested Specialty:   Vascular Surgery    Number of Visits Requested:   1   POCT glucose (manual entry)     Patient Instructions  We will refer you to vascular surgery for further evaluation of right leg pain.  Fingerstick check today.  Labs today.  Drop metformin to 1/2 tablet (5024m twice daily. Add on glipizide 64m50mwice daily with breakfast and dinner. Always take glipizide with food.  Increase water intake.  Return in 3-4 weeks for follow up visit.  Use desitin barrier cream to bottom in am, use lotrimin antifungal cream to bottom in PM   Follow up plan: Return in about 4 weeks (around 06/22/2022) for follow up visit.  JavRia BushD

## 2022-05-30 ENCOUNTER — Telehealth: Payer: Self-pay

## 2022-05-30 ENCOUNTER — Other Ambulatory Visit: Payer: Self-pay | Admitting: Family Medicine

## 2022-05-30 DIAGNOSIS — N179 Acute kidney failure, unspecified: Secondary | ICD-10-CM | POA: Insufficient documentation

## 2022-05-30 DIAGNOSIS — I700 Atherosclerosis of aorta: Secondary | ICD-10-CM | POA: Insufficient documentation

## 2022-05-30 DIAGNOSIS — E1165 Type 2 diabetes mellitus with hyperglycemia: Secondary | ICD-10-CM

## 2022-05-30 DIAGNOSIS — Z9081 Acquired absence of spleen: Secondary | ICD-10-CM | POA: Insufficient documentation

## 2022-05-30 MED ORDER — ACCU-CHEK GUIDE VI STRP: 1.0000 | ORAL_STRIP | Freq: Two times a day (BID) | 3 refills | Status: DC | PRN

## 2022-05-30 NOTE — Assessment & Plan Note (Signed)
ABI 04/2022 - non-compressible ABIs bilaterally, abnormal R TBI. Will offer VVS eval.  Refer to VVS for further evaluation.

## 2022-05-30 NOTE — Progress Notes (Cosign Needed)
Chronic Care Management Pharmacy Assistant   Name: VINNY TARANTO  MRN: [id number] DOB: 05/15/1946  Reason for Encounter: Non-CCM (Hosptial Follow Up)  Medications: Outpatient Encounter Medications as of 05/30/2022  Medication Sig   Accu-Chek FastClix Lancets MISC Use as directed to check sugars daily   albuterol (VENTOLIN HFA) 108 (90 Base) MCG/ACT inhaler TAKE 2 PUFFS BY MOUTH EVERY 6 HOURS AS NEEDED FOR WHEEZE OR SHORTNESS OF BREATH   amLODipine (NORVASC) 10 MG tablet Take 0.5 tablets (5 mg total) by mouth daily.   aspirin EC 81 MG tablet Take 81 mg by mouth every other day.   atenolol (TENORMIN) 100 MG tablet TAKE 1 TABLET (100 MG TOTAL) BY MOUTH DAILY AFTER BREAKFAST.   Blood Glucose Monitoring Suppl (ACCU-CHEK GUIDE) w/Device KIT Use as directed to check sugars daily   Cholecalciferol (VITAMIN D3) 25 MCG (1000 UT) CAPS Take 1 capsule (1,000 Units total) by mouth daily. (Patient taking differently: Take 1 capsule by mouth daily. Takes about twice a month)   glipiZIDE (GLUCOTROL) 5 MG tablet Take 1 tablet (5 mg total) by mouth daily before breakfast.   glucose blood (ACCU-CHEK GUIDE) test strip 1 each by Other route 2 (two) times daily as needed for other. E11.65. Use as instructed   hydrochlorothiazide (HYDRODIURIL) 25 MG tablet TAKE 1 TABLET (25 MG TOTAL) BY MOUTH DAILY.   hydrOXYzine (ATARAX) 25 MG tablet Take 1 tablet (25 mg total) by mouth every 8 (eight) hours as needed for up to 21 doses.   loratadine (CLARITIN) 10 MG tablet Take 1 tablet (10 mg total) by mouth daily. (Patient taking differently: Take 10 mg by mouth daily. As needed)   metFORMIN (GLUCOPHAGE) 1000 MG tablet Take 0.5 tablets (500 mg total) by mouth 2 (two) times daily.   omeprazole (PRILOSEC) 40 MG capsule TAKE 1 CAPSULE (40 MG TOTAL) BY MOUTH DAILY.   rosuvastatin (CRESTOR) 5 MG tablet Take 1 tablet (5 mg total) by mouth daily.   sildenafil (REVATIO) 20 MG tablet TAKE 2-5 TABLETS BY MOUTH EACH DAY AS NEEDED  (RELATIONS)   triamcinolone cream (KENALOG) 0.1 % Apply topically 2 (two) times daily.   TURMERIC PO Take by mouth daily.   No facility-administered encounter medications on file as of 05/30/2022.     Reviewed hospital notes for details of recent visit. Has patient been contacted by Transitions of Care team? No Has patient seen PCP/specialist for hospital follow up (summarize OV if yes): Yes 05/25/2022 Ria Bush, MD Diabetes 2 with hyperglycemia, acutely worse after steroid course 04/2022, with glu up to 600s in ER, treated with IVF and starting metformin 1081m bid. Given kidney function, recommend limiting metformin dose to 5016mBID. Will add glipizide 47m27mID, reviewed need to take with meal to avoid hypoglycemia.  CMA teaching for glucometer today, he will check on insurance-preferred glucometer brand and let us [place] to send in. Will send in [facility] interim. RTC 3-4 wks f/u visit.   Admitted to the ED on 05/21/2022. Discharge date was 05/22/2022.  Discharged from [facility] Discharge diagnosis (Principal Problem): Hyperglycemia, AKI (acute kidney injury) and Type 2 diabetes mellitus without complication  Patient was discharged to Home  Brief summary of hospital course: This patient presents to the ED with concern for fatigue, hyperglycemia. This involves an extensive number of treatment options, and is a complaint that carries with it a high risk of complications and morbidity.  The differential diagnosis includes new onset diabetes versus HHS  versus UTI versus anemia versus metabolic derangement versus other   Co-morbidities that complicate the patient evaluation: History of poorly controlled blood sugars and diabetes, and prednisone use at high risk for diabetic complications   Additional history obtained from patient's wife at bedside   External records from outside source obtained and reviewed including A1c levels as noted above  I ordered and  personally interpreted labs.  The pertinent results include: Patient has hyperglycemia glucose 671, mild developed AKI with BUN of 34 and creatinine 2.1, suspected to be due to hydration, glucose in the urine.  COVID and flu are negative.  He has an anion gap of 21.  Also some pseudohyponatremia sodium 129.  No anemia or leukocytosis.  Troponin is unremarkable 20, no chest pain, doubt ACS    The patient was maintained on a cardiac monitor.  I personally viewed and interpreted the cardiac monitored which showed an underlying rhythm of: Sinus rhythm   Per my interpretation the patient's ECG shows sinus rhythm with no acute ischemic findings   I ordered medication including IV fluids for hydration and IV insulin for hyperglycemia   I have reviewed the patients home medicines and have made adjustments as needed   Test Considered: Low suspicion for acute PE, sepsis   After the interventions noted above, I reevaluated the patient and found that they have: improved   Repeat labs show that his anion gap is closed and his kidney function and BUN significantly improved.  I discussed with the patient the need for continued aggressive drinking and water intake.  Avoiding soda and sugary beverages.   Dispostion:   After consideration of the diagnostic results and the patients response to treatment, I feel that the patent would benefit from close outpatient PCP follow-up..    New?Medications Started at [facility] Discharge:?? -Started hydrOXYzine (ATARAX) 25 MG tablet -Started metformin (GLUCOPHAGE) 1000 MG tablet   Medication Changes at Hospital Discharge: None noted  Medications Discontinued at Hospital Discharge: None noted  Medications that remain the same after Hospital Discharge:??  -All other medications will remain the same.    Next CCM appt: Non-CCM  Other upcoming appts: PCP appointment on 06/22/2022  Charlene Brooke, PharmD notified and will determine if action is  needed.  Charlene Brooke, CPP notified  Marijean Niemann, [place] Clinical Pharmacy Assistant [phone]   Pharmacist addendum: Patient has seen PCP and had education with PharmD regarding insulin training. No further action needed.  Charlene Brooke, PharmD, BCACP 06/02/22 12:20 PM

## 2022-05-30 NOTE — Assessment & Plan Note (Addendum)
Diabetes 2 with hyperglycemia, acutely worse after steroid course 04/2022, with glu up to 600s in ER, treated with IVF and starting metformin '1000mg'$  bid. Given kidney function, recommend limiting metformin dose to '500mg'$  BID. Will add glipizide '5mg'$  BID, reviewed need to take with meal to avoid hypoglycemia.  CMA teaching for glucometer today, he will check on insurance-preferred glucometer brand and let us know to send in. Will send in [place] in interim. RTC 3-4 wks f/u visit.

## 2022-05-30 NOTE — Assessment & Plan Note (Addendum)
Thought hyperglycemia-induced. Anticipate improvement as sugar control improves.  Drop metformin to '500mg'$  BID.  Encouraged good water intake.

## 2022-05-30 NOTE — Telephone Encounter (Addendum)
Patient notified as instructed by telephone and verbalized understanding.  Patient stated that he did not increase his glipizide to twice a day and only taking one a day.Patient stated that he will make the change today. Patient stated that his blood sugar 05/27/22 at 10:51 pm was 391, 11/25/23427 at 9:11 pm, 05/30/23 466 at 6:40 and 427 at 9:11 pm Patient stated that he needs a refill on his test strips.  Test strips added to medication, need directions/how often to use and send to pharmacy

## 2022-05-30 NOTE — Addendum Note (Signed)
Addended by: Emelia Salisbury C on: 05/30/2022 01:21 PM   Modules accepted: Orders

## 2022-05-30 NOTE — Telephone Encounter (Signed)
[facility] to [facility].

## 2022-05-30 NOTE — Assessment & Plan Note (Signed)
Skin rash midline sacrum anticipate component of fungal infection given hyperglycemia - rec lotrimin antifungal and barrier cream. Reassess at f/u visit.

## 2022-05-30 NOTE — Addendum Note (Signed)
Addended by: Ria Bush on: 05/30/2022 01:40 PM   Modules accepted: Orders

## 2022-05-30 NOTE — Telephone Encounter (Signed)
Recently seen for new hyperglycemia, started on glipizide '5mg'$  BID with meals and metformin '500mg'$  BID.  Plz call for update on sugar control over weekend. If staying >300, would offer starting daily insulin shots as best way to get quick control of sugars.

## 2022-05-30 NOTE — Assessment & Plan Note (Signed)
Ongoing leg pain in known hip osteoarthritis however pain not fully explained by OA.  Abnormal ABIs - will refer to vascular to help r/o arterial insufficiency.

## 2022-05-30 NOTE — Assessment & Plan Note (Signed)
H/o this, saw ortho treated with NSAIDs.  Consider return to ortho after completes vascular evaluation.

## 2022-05-30 NOTE — Assessment & Plan Note (Signed)
On crestor '5mg'$  daily. Consider titration to goal LDL <70.  The 10-year ASCVD risk score (Arnett DK, et al., 2019) is: 32.1%   Values used to calculate the score:     Age: 76 years     Sex: Male     Is Non-Hispanic African American: Yes     Diabetic: Yes     Tobacco smoker: No     Systolic Blood Pressure: 241 mmHg     Is BP treated: Yes     HDL Cholesterol: 46.3 mg/dL     Total Cholesterol: 174 mg/dL

## 2022-05-31 ENCOUNTER — Encounter: Payer: Self-pay | Admitting: Family Medicine

## 2022-05-31 ENCOUNTER — Telehealth: Payer: Self-pay

## 2022-05-31 ENCOUNTER — Inpatient Hospital Stay: Payer: BLUE CROSS/BLUE SHIELD | Admitting: Family Medicine

## 2022-05-31 MED ORDER — INSULIN PEN NEEDLE 31G X 5 MM MISC: 1 refills | Status: DC

## 2022-05-31 MED ORDER — LANTUS SOLOSTAR 100 UNIT/ML ~~LOC~~ SOPN: 10.0000 [IU] | PEN_INJECTOR | Freq: Every day | SUBCUTANEOUS | 1 refills | Status: DC

## 2022-05-31 NOTE — Telephone Encounter (Signed)
Spoke with patient. Glu 488 yesterday am, 398 yesterday evening, this morning 353.  Glipizide was sent in at '5mg'$  daily dosing. Advised take BID with meals.  Reviewed benefits of daily insulin - rec start basal insulin daily at 10u daily.  Will drop glipizide to '5mg'$  daily with breakfast when he starts insulin.  See other phone note.

## 2022-05-31 NOTE — Telephone Encounter (Signed)
Spoke with patient. Cbgs remain 300-400s. This is despite glipizide '5mg'$  BID with meals and metformin '1000mg'$  1/2 tab BID.  Will start lantus 10u daily with titration schedule of 2 units every 2 days if fasting sugars >150. Will ask him to drop glipizide to '5mg'$  once daily when he starts insulin.   Ria Comment - are you available to schedule insulin teaching appt with pt for this week? Also to check on glucometer Rx to get covered by medicare - they state it was not covered the way I sent it in. Thank you!

## 2022-05-31 NOTE — Telephone Encounter (Addendum)
[place] Night - Client Nonclinical Telephone Record  [facility] Client [facility] Night - Client Client Site [place] Provider Ria Bush - MD Contact Type Call Who Is Calling Patient / Member / Family / Caregiver Caller Name Serita Kyle Phone Number [phone] Patient Name Gregory Abbott Patient DOB 1946/06/15 Call Type Message Only Information Provided Reason for Call Request for General Office Information Initial Comment Caller states her husband needs his RX written for medical supplies due to the insurance company. Additional Comment Office hours provided. Disp. Time Disposition Final User 05/30/2022 5:55:47 PM General Information Provided Yes Benetta Spar Call Closed By: Benetta Spar Transaction Date/Time: 05/30/2022 5:53:42 PM (ET   See 05/25/22 office notes. Sending to [employer].

## 2022-06-01 ENCOUNTER — Other Ambulatory Visit: Payer: Self-pay | Admitting: Family Medicine

## 2022-06-01 DIAGNOSIS — E1169 Type 2 diabetes mellitus with other specified complication: Secondary | ICD-10-CM

## 2022-06-01 MED ORDER — ACCU-CHEK FASTCLIX LANCETS MISC: 1 refills | Status: DC

## 2022-06-01 NOTE — Telephone Encounter (Signed)
Spoke with patient. Scheduled insulin training for 11/30 @ 10am. Pt will try to bring his insulin with him to appt.

## 2022-06-01 NOTE — Addendum Note (Signed)
Addended by: Ria Bush on: 06/01/2022 01:57 PM   Modules accepted: Orders

## 2022-06-01 NOTE — Telephone Encounter (Signed)
Attempted to reach patient to schedule appt, unable to reach and left VM.  [facility] - they were able to fill Accu chek meter and test strips but need a new Rx for Accu Chek Softclix lancets with diagnosis code E11.69 to be able to bill Part B.  Routing to PCP for Rx.

## 2022-06-01 NOTE — Telephone Encounter (Signed)
New Rx for lancets sent to pharmacy. Thank you.

## 2022-06-01 NOTE — Telephone Encounter (Signed)
Pt returned call. Requesting a call back # [phone]

## 2022-06-02 ENCOUNTER — Ambulatory Visit: Payer: Medicare Other

## 2022-06-02 NOTE — Telephone Encounter (Signed)
Patient presented for insulin training.   He brought his Lantus solostar pen to appt. Demonstrated proper insulin injection technique including attaching the pen needle, dialing up the correct dose, injecting the pen at 90 degree angle into the abdomen, and waiting 10 seconds after injection before removing the pen. Discussed changing the needle for each injection. Discussed proper sharps disposal in a hard plastic container. Advised to keep insulin refrigerated until right before use.  Discussed blood sugar goals and difference between fasting and post-prandial glucose. Also discussed prevention and treatment of hypoglycemia. Discussed warning symptoms for hypoglycemia and rule of 15s for BG < 70.  Discussed plate method to improve diet choices as well. Also provided patient with Diabetes Nutrition handout.  Pt successfully administered first injection of Lantus 10 units during visit today.  Per PCP instructions:  -Advised he take Lantus 10 units daily HS for 3 days. If fasting BG is not < 150 after 3 days, increase to 12 units. Continue increasing Lantus by 2 units every 3 days until fasting BG is < 150. -Advised to reduce glipizide to once a day -PCP F/U 06/22/22  Pt voiced understanding of above and denied further questions.

## 2022-06-03 MED ORDER — ACCU-CHEK SOFTCLIX LANCETS MISC: 3 refills | Status: AC

## 2022-06-03 NOTE — Telephone Encounter (Signed)
E-scribed Accu-chek Softclix lancets.

## 2022-06-09 NOTE — Progress Notes (Signed)
Office Note     CC: Right leg pain Requesting Provider:  Ria Bush, MD  HPI: Gregory Abbott is a 76 y.o. (Dec 13, 1945) male presenting at the request of .Ria Bush, MD for right lower extremity pain with abnormal ABIs.  Exam, [place] was doing well, accompanied by his wife.  Originally from [place], he worked for over 25 years in [place] prior to moving down to [place].  He is enjoying retirement and better weather.  Earlier this year, awaiting appreciated right-sided thigh pain with a shooting sensation from the hip.  Was referred to an orthopedist who noted degenerative changes in the hip.  When went to physical therapy with mild improvement.  He presents today to ensure there is not a vascular etiology for his right lower extremity pain as recent ABIs were abnormal.  When notes pain when moving from the sitting to standing position.  This causes him to limp.  He denies symptoms of claudication, ischemic rest pain, tissue loss.   Past Medical History:  Diagnosis Date   Blood transfusion without reported diagnosis    BPH (benign prostatic hypertrophy)    Cataract    Childhood asthma    COPD (chronic obstructive pulmonary disease) ([place])    Eczema    Ex-smoker    16 PY hx   GERD (gastroesophageal reflux disease)    History of chicken pox    Hypertension    Lower back pain    h/o herniated and bulging discs lower back   Osteoarthritis of neck    Personal history of colonic polyps 05/07/2013   resected not retrieved   PUD (peptic ulcer disease)    S/P splenectomy    partial   Shortness of breath     Past Surgical History:  Procedure Laterality Date   AAA screen  04/2013   neg for AAA   CATARACT EXTRACTION Left 05/2013   [facility]   CATARACT EXTRACTION W/PHACO Right 10/06/2015   Procedure: CATARACT EXTRACTION PHACO AND INTRAOCULAR LENS PLACEMENT ([place]);  Surgeon: Birder Robson, MD;  Location: [facility] ORS;  Service: Ophthalmology;  Laterality:  Right;  [place] AP% CDE fluid pack lot [id number] H exp 04/02/2017   COLONOSCOPY  2009   COLONOSCOPY  05/2013   1 polyp, int/ext hemorrhoids Carlean Purl)   COLONOSCOPY  08/2018   Diminutive adenoma, no f/u planned Carlean Purl)   KNEE ARTHROSCOPY     lower extremity arterial doppler  2013   WNL   SPLENECTOMY, PARTIAL  1998   fall down stairs    Social History   Socioeconomic History   Marital status: Married    Spouse name: Not on file   Number of children: 2   Years of education: Not on file   Highest education level: Not on file  Occupational History   Occupation: Retired  Tobacco Use   Smoking status: Former    Packs/day: 0.50    Years: 49.00    Total pack years: 24.50    Types: Cigarettes    Quit date: 11/06/2013    Years since quitting: 8.5   Smokeless tobacco: Never  Vaping Use   Vaping Use: Never used  Substance and Sexual Activity   Alcohol use: Yes    Alcohol/week: 2.0 standard drinks of alcohol    Types: 1 Cans of beer, 1 Shots of liquor per week    Comment: occasional   Drug use: No   Sexual activity: Yes  Other Topics Concern   Not on file  Social History  Narrative   Lives with wife, no pets   Occupation: retired, Engineer, mining   Edu: Degree   Activity: housework, no regular exercise   Diet: good water, fruits/vegetables daily   Social Determinants of Health   Financial Resource Strain: Low Risk  (10/18/2021)   Overall Financial Resource Strain (CARDIA)    Difficulty of Paying Living Expenses: Not hard at all  Food Insecurity: No Food Insecurity (10/18/2021)   Hunger Vital Sign    Worried About Running Out of Food in the Last Year: Never true    [place] in the Last Year: Never true  Transportation Needs: No Transportation Needs (10/18/2021)   PRAPARE - Hydrologist (Medical): No    Lack of Transportation (Non-Medical): No  Physical Activity: Sufficiently Active (10/18/2021)   Exercise Vital Sign    Days of Exercise per Week: 5  days    Minutes of Exercise per Session: 30 min  Stress: No Stress Concern Present (10/18/2021)   [place]    Feeling of Stress : Not at all  Social Connections: [place] (10/18/2021)   Social Connection and Isolation Panel [NHANES]    Frequency of Communication with Friends and Family: More than three times a week    Frequency of Social Gatherings with Friends and Family: More than three times a week    Attends Religious Services: More than 4 times per year    Active Member of [employer] or Organizations: Yes    Attends Music therapist: More than 4 times per year    Marital Status: Married  Human resources officer Violence: Not At Risk (10/18/2021)   Humiliation, Afraid, Rape, and Kick questionnaire    Fear of Current or Ex-Partner: No    Emotionally Abused: No    Physically Abused: No    Sexually Abused: No   Family History  Problem Relation Age of Onset   Diabetes Mother    Hypertension Mother    Hyperlipidemia Mother    Heart disease Mother    Stroke Father    Colon cancer Neg Hx    Rectal cancer Neg Hx    Stomach cancer Neg Hx    Esophageal cancer Neg Hx     Current Outpatient Medications  Medication Sig Dispense Refill   Accu-Chek FastClix Lancets MISC E11.69 Use as directed to check sugars daily 100 each 1   Accu-Chek Softclix Lancets lancets Use as instructed to check blood sugar 2 times a day 200 each 3   albuterol (VENTOLIN HFA) 108 (90 Base) MCG/ACT inhaler TAKE 2 PUFFS BY MOUTH EVERY 6 HOURS AS NEEDED FOR WHEEZE OR SHORTNESS OF BREATH 8.5 each 0   amLODipine (NORVASC) 10 MG tablet Take 0.5 tablets (5 mg total) by mouth daily.     aspirin EC 81 MG tablet Take 81 mg by mouth every other day.     atenolol (TENORMIN) 100 MG tablet TAKE 1 TABLET (100 MG TOTAL) BY MOUTH DAILY AFTER BREAKFAST. 90 tablet 3   Blood Glucose Monitoring Suppl (ACCU-CHEK GUIDE) w/Device KIT Use as directed to check  sugars daily 1 kit 0   Cholecalciferol (VITAMIN D3) 25 MCG (1000 UT) CAPS Take 1 capsule (1,000 Units total) by mouth daily. (Patient taking differently: Take 1 capsule by mouth daily. Takes about twice a month) 30 capsule    glipiZIDE (GLUCOTROL) 5 MG tablet Take 1 tablet (5 mg total) by mouth daily before breakfast. 30 tablet 11  glucose blood (ACCU-CHEK GUIDE) test strip 1 each by Other route 2 (two) times daily as needed for other. E11.65. Use as instructed 100 each 3   hydrochlorothiazide (HYDRODIURIL) 25 MG tablet TAKE 1 TABLET (25 MG TOTAL) BY MOUTH DAILY. 90 tablet 3   hydrOXYzine (ATARAX) 25 MG tablet Take 1 tablet (25 mg total) by mouth every 8 (eight) hours as needed for up to 21 doses. 21 tablet 0   insulin glargine (LANTUS SOLOSTAR) 100 UNIT/ML Solostar Pen Inject 10 Units into the skin at bedtime. 3 mL 1   Insulin Pen Needle 31G X 5 MM MISC Use for daily insulin injection 100 each 1   loratadine (CLARITIN) 10 MG tablet Take 1 tablet (10 mg total) by mouth daily. (Patient taking differently: Take 10 mg by mouth daily. As needed)     metFORMIN (GLUCOPHAGE) 1000 MG tablet Take 0.5 tablets (500 mg total) by mouth 2 (two) times daily.     omeprazole (PRILOSEC) 40 MG capsule TAKE 1 CAPSULE (40 MG TOTAL) BY MOUTH DAILY. 90 capsule 3   rosuvastatin (CRESTOR) 5 MG tablet Take 1 tablet (5 mg total) by mouth daily. 90 tablet 3   sildenafil (REVATIO) 20 MG tablet TAKE 2-5 TABLETS BY MOUTH EACH DAY AS NEEDED (RELATIONS) 30 tablet 3   triamcinolone cream (KENALOG) 0.1 % Apply topically 2 (two) times daily. 60 g 1   TURMERIC PO Take by mouth daily.     No current facility-administered medications for this visit.    Allergies  Allergen Reactions   Penicillins Other (See Comments)    Unknown - as child. ?asthma   Azithromycin Rash   Losartan Rash    Itchy diffuse rash throughout body     REVIEW OF SYSTEMS:  _0  denotes positive finding, _1  denotes negative finding Cardiac  Comments:   Chest pain or chest pressure:    Shortness of breath upon exertion:    Short of breath when lying flat:    Irregular heart rhythm:        Vascular    Pain in calf, thigh, or hip brought on by ambulation:    Pain in feet at night that wakes you up from your sleep:     Blood clot in your veins:    Leg swelling:         Pulmonary    Oxygen at home:    Productive cough:     Wheezing:         Neurologic    Sudden weakness in arms or legs:     Sudden numbness in arms or legs:     Sudden onset of difficulty speaking or slurred speech:    Temporary loss of vision in one eye:     Problems with dizziness:         Gastrointestinal    Blood in stool:     Vomited blood:         Genitourinary    Burning when urinating:     Blood in urine:        Psychiatric    Major depression:         Hematologic    Bleeding problems:    Problems with blood clotting too easily:        Skin    Rashes or ulcers:        Constitutional    Fever or chills:      PHYSICAL EXAMINATION:  Vitals:   06/10/22 1055 06/10/22 1059  BP: [name])  147/91 (!) 144/73  Pulse: 76   Resp: 20   Temp: (!) 97 F (36.1 C)   SpO2: 96%   Weight: 176 lb (79.8 kg)   Height: _0  (1.753 m)     General:  WDWN in NAD; vital signs documented above Gait: Not observed HENT: WNL, normocephalic Pulmonary: normal non-labored breathing , without wheezing Cardiac: regular HR Abdomen: soft, NT, no masses Skin: without rashes Vascular Exam/Pulses:  Right Left  Radial 2+ (normal) 2+ (normal)  Ulnar    Femoral    Popliteal    DP 2+ (normal) 2+ (normal)  PT     Extremities: without ischemic changes, without Gangrene , without cellulitis; without open wounds;  Musculoskeletal: no muscle wasting or atrophy  Neurologic: A&O X 3;  No focal weakness or paresthesias are detected Psychiatric:  The pt has Normal affect.   Non-Invasive Vascular Imaging:   ABI Findings:   +---------+------------------+-----+--------+--------+  Right   Rt Pressure (mmHg)IndexWaveformComment   +---------+------------------+-----+--------+--------+  Brachial 142                                      +---------+------------------+-----+--------+--------+  PTA     254               1.78 biphasic          +---------+------------------+-----+--------+--------+  PERO    173               1.21 biphasic          +---------+------------------+-----+--------+--------+  DP      169               1.18 biphasic          +---------+------------------+-----+--------+--------+  Great Toe92                0.64 Normal            +---------+------------------+-----+--------+--------+   +---------+------------------+-----+--------+-------+  Left    Lt Pressure (mmHg)IndexWaveformComment  +---------+------------------+-----+--------+-------+  Brachial 143                                     +---------+------------------+-----+--------+-------+  PTA     201               1.41 biphasic         +---------+------------------+-----+--------+-------+  PERO    164               1.15 biphasic         +---------+------------------+-----+--------+-------+  DP      185               1.29 biphasic         +---------+------------------+-----+--------+-------+  Great Toe129               0.90 Normal           +---------+------------------+-----+--------+-------+   +-------+-----------+-----------+------------+------------+  ABI/TBIToday's ABIToday's TBIPrevious ABIPrevious TBI  +-------+-----------+-----------+------------+------------+  Right N/C        .64                                  +-------+-----------+-----------+------------+------------+  Left  N/C        .90                                  +-------+-----------+-----------+------------+------------+  ASSESSMENT/PLAN: GRYFFIN ALTICE is a 76  y.o. male presenting with right lower extremity pain that radiates from the hip down to the knee.  He describes this as intense pain that shoots down to the knee.  Pain is most pronounced when moving from sitting to standing.  And is inhibiting his ability to ambulate normally.  He is thinking about buying a cane.  Symptoms are not consistent with claudication.  He denies rest pain, tissue loss.  ABIs reviewed demonstrating medial calcinosis of his arteries with normal toe pressures distally.  Waveforms are relatively well-preserved.  He was recently diagnosed with diabetes, he would benefit from yearly follow-up with our office to ensure there are no arterial changes as diabetes leads to circulatory destruction.  When would be best served with therapeutic follow-up for musculoskeletal hip pain into the thigh.  Recommend the following which can slow the progression of atherosclerosis and reduce the risk of major adverse cardiac / limb events:  Aspirin 51m PO QD.  Atorvastatin 40-80mg PO QD (or other "high intensity" statin therapy). Complete cessation from all tobacco products. Blood glucose control with goal A1c < 7%. Blood pressure control with goal blood pressure < 140/90 mmHg. Lipid reduction therapy with goal LDL-C <100 mg/dL (<70 if symptomatic from PAD).    JBroadus John MD [facility] [phone] time of patient care including pre-visit research, consultation, and documentation greater than 30 minutes

## 2022-06-10 ENCOUNTER — Encounter: Payer: Self-pay | Admitting: Vascular Surgery

## 2022-06-10 ENCOUNTER — Ambulatory Visit (INDEPENDENT_AMBULATORY_CARE_PROVIDER_SITE_OTHER): Payer: Medicare Other | Admitting: Vascular Surgery

## 2022-06-10 VITALS — BP 144/73 | HR 76 | Temp 97.0°F | Resp 20 | Ht 69.0 in | Wt 176.0 lb

## 2022-06-10 DIAGNOSIS — I739 Peripheral vascular disease, unspecified: Secondary | ICD-10-CM

## 2022-06-22 ENCOUNTER — Encounter: Payer: Self-pay | Admitting: Family Medicine

## 2022-06-22 ENCOUNTER — Ambulatory Visit (INDEPENDENT_AMBULATORY_CARE_PROVIDER_SITE_OTHER): Payer: Medicare Other | Admitting: Family Medicine

## 2022-06-22 ENCOUNTER — Ambulatory Visit (INDEPENDENT_AMBULATORY_CARE_PROVIDER_SITE_OTHER)
Admission: RE | Admit: 2022-06-22 | Discharge: 2022-06-22 | Disposition: A | Discharge status: Home / Self Care | Payer: Medicare Other | Source: Ambulatory Visit | Attending: Family Medicine | Admitting: Family Medicine

## 2022-06-22 VITALS — BP 136/68 | HR 81 | Temp 98.1°F | Ht 69.0 in | Wt 178.8 lb

## 2022-06-22 DIAGNOSIS — M1611 Unilateral primary osteoarthritis, right hip: Secondary | ICD-10-CM | POA: Diagnosis not present

## 2022-06-22 DIAGNOSIS — R0989 Other specified symptoms and signs involving the circulatory and respiratory systems: Secondary | ICD-10-CM | POA: Diagnosis not present

## 2022-06-22 DIAGNOSIS — R21 Rash and other nonspecific skin eruption: Secondary | ICD-10-CM

## 2022-06-22 DIAGNOSIS — I10 Essential (primary) hypertension: Secondary | ICD-10-CM

## 2022-06-22 DIAGNOSIS — E1169 Type 2 diabetes mellitus with other specified complication: Secondary | ICD-10-CM

## 2022-06-22 DIAGNOSIS — M5441 Lumbago with sciatica, right side: Secondary | ICD-10-CM

## 2022-06-22 DIAGNOSIS — M1120 Other chondrocalcinosis, unspecified site: Secondary | ICD-10-CM

## 2022-06-22 DIAGNOSIS — M545 Low back pain, unspecified: Secondary | ICD-10-CM | POA: Diagnosis not present

## 2022-06-22 DIAGNOSIS — M79604 Pain in right leg: Secondary | ICD-10-CM

## 2022-06-22 LAB — RENAL FUNCTION PANEL
Albumin: 4.2 g/dL (ref 3.5–5.2)
BUN: 13 mg/dL (ref 6–23)
CO2: 30 mEq/L (ref 19–32)
Calcium: 9.9 mg/dL (ref 8.4–10.5)
Chloride: 99 mEq/L (ref 96–112)
Creatinine, Ser: 1.06 mg/dL (ref 0.40–1.50)
GFR: 67.93 mL/min (ref >60.00)
Glucose, Bld: 76 mg/dL (ref 70–99)
Phosphorus: 3.1 mg/dL (ref 2.3–4.6)
Potassium: 3.9 mEq/L (ref 3.5–5.1)
Sodium: 139 mEq/L (ref 135–145)

## 2022-06-22 LAB — CK: Total CK: 52 U/L (ref 7–232)

## 2022-06-22 LAB — POCT GLYCOSYLATED HEMOGLOBIN (HGB A1C): Hemoglobin A1C: 12.5 % — AB (ref 4.0–5.6)

## 2022-06-22 LAB — TSH: TSH: 1.9 u[IU]/mL (ref 0.35–5.50)

## 2022-06-22 MED ORDER — TRAMADOL HCL 50 MG PO TABS: 50.0000 mg | ORAL_TABLET | Freq: Two times a day (BID) | ORAL | 0 refills | Status: DC | PRN

## 2022-06-22 NOTE — Patient Instructions (Addendum)
Hold glipizide and hydrochlorothiazide for 1 month to see if rash gets better. Monitor sugars - if fasting sugars staying >150 off glipizide, then bump up lantus.  Labs today.  Lower back xray today - pending results will proceed with lower back MRI.  Try tramadol 25-'50mg'$  (1/2-1 tablet) twice daily as needed for pain.  Return in 1 month for follow up visit.

## 2022-06-22 NOTE — Progress Notes (Signed)
Patient ID: Gregory Abbott, male    DOB: [birth year], 76 y.o.   MRN: [id number]  This visit was conducted in person.  BP 136/68   Pulse 81   Temp 98.1 F (36.7 C) (Temporal)   Ht _0  (1.753 m)   Wt 178 lb 12.8 oz (81.1 kg)   SpO2 96%   BMI 26.40 kg/m   BP Readings from Last 3 Encounters:  06/22/22 136/68  06/10/22 (!) 144/73  05/25/22 114/68    CC: 4 wk DM f/u visit  Subjective:   HPI: Gregory Abbott is a 76 y.o. male presenting on 06/22/2022 for Diabetes (Here for 4 wk f/u. Pt accompanied by wife, Gregory Abbott. )   Ongoing R leg pain - completed PAD evaluation earlier in the month - leg pain not due to arterial claudication. He doesn't think this is coming from the hip. Notes ongoing pain to top of foot (started several weeks ago) as well as intermittent shooting pain down anterior thigh and lower leg, as well as groin pain. He saw ortho Dr Gregory Abbott early 2023 (no records available), he had 2 weeks of PT - and has continued HEP - but right leg pain has persisted. No numbness or weakness of leg. Some paresthesias to groin/thigh.   Saw pharmacist for insulin and glucometer teaching.  DM - does regularly check sugars twice daily and brings log - lowest cbg 83, averaging fasting 120s, PM 120s. Compliant with antihyperglycemic regimen which includes: glipizide 53m daily, lantus 12u daily, metformin 5042mbid. Denies low sugars or hypoglycemic symptoms. Denies paresthesias, blurry vision. Last diabetic eye exam 09/2021. Glucometer brand: accuchek. Last foot exam: 01/2022. DSME: previously declined. Lab Results  Component Value Date   HGBA1C 12.5 (A) 06/22/2022   Diabetic Foot Exam - Simple   No data filed    Lab Results  Component Value Date   MICROALBUR 0.8 10/15/2021     Daughter diagnosed with RA, mother also has RA diagnosis.  No significant shoulder pain or neck pain, hand pain, elbow pain. No redness/swelling of joints.   Ongoing skin rash, previously thought medicine  related. Benadryl has helped.      Relevant past medical, surgical, family and social history reviewed and updated as indicated. Interim medical history since our last visit reviewed. Allergies and medications reviewed and updated. Outpatient Medications Prior to Visit  Medication Sig Dispense Refill   Accu-Chek FastClix Lancets MISC E11.69 Use as directed to check sugars daily 100 each 1   Accu-Chek Softclix Lancets lancets Use as instructed to check blood sugar 2 times a day 200 each 3   albuterol (VENTOLIN HFA) 108 (90 Base) MCG/ACT inhaler TAKE 2 PUFFS BY MOUTH EVERY 6 HOURS AS NEEDED FOR WHEEZE OR SHORTNESS OF BREATH 8.5 each 0   amLODipine (NORVASC) 10 MG tablet Take 0.5 tablets (5 mg total) by mouth daily.     aspirin EC 81 MG tablet Take 81 mg by mouth every other day.     atenolol (TENORMIN) 100 MG tablet TAKE 1 TABLET (100 MG TOTAL) BY MOUTH DAILY AFTER BREAKFAST. 90 tablet 3   Blood Glucose Monitoring Suppl (ACCU-CHEK GUIDE) w/Device KIT Use as directed to check sugars daily 1 kit 0   Cholecalciferol (VITAMIN D3) 25 MCG (1000 UT) CAPS Take 1 capsule (1,000 Units total) by mouth daily. (Patient taking differently: Take 1 capsule by mouth daily. Takes about twice a month) 30 capsule    glucose blood (ACCU-CHEK GUIDE) test strip 1 each by  Other route 2 (two) times daily as needed for other. E11.65. Use as instructed 100 each 3   insulin glargine (LANTUS SOLOSTAR) 100 UNIT/ML Solostar Pen Inject 10 Units into the skin at bedtime. 3 mL 1   Insulin Pen Needle 31G X 5 MM MISC Use for daily insulin injection 100 each 1   loratadine (CLARITIN) 10 MG tablet Take 1 tablet (10 mg total) by mouth daily. (Patient taking differently: Take 10 mg by mouth daily. As needed)     metFORMIN (GLUCOPHAGE) 1000 MG tablet Take 0.5 tablets (500 mg total) by mouth 2 (two) times daily.     omeprazole (PRILOSEC) 40 MG capsule TAKE 1 CAPSULE (40 MG TOTAL) BY MOUTH DAILY. 90 capsule 3   rosuvastatin (CRESTOR) 5 MG  tablet Take 1 tablet (5 mg total) by mouth daily. 90 tablet 3   sildenafil (REVATIO) 20 MG tablet TAKE 2-5 TABLETS BY MOUTH EACH DAY AS NEEDED (RELATIONS) 30 tablet 3   triamcinolone cream (KENALOG) 0.1 % Apply topically 2 (two) times daily. 60 g 1   TURMERIC PO Take by mouth daily.     glipiZIDE (GLUCOTROL) 5 MG tablet Take 1 tablet (5 mg total) by mouth daily before breakfast. 30 tablet 11   hydrochlorothiazide (HYDRODIURIL) 25 MG tablet TAKE 1 TABLET (25 MG TOTAL) BY MOUTH DAILY. 90 tablet 3   hydrOXYzine (ATARAX) 25 MG tablet Take 1 tablet (25 mg total) by mouth every 8 (eight) hours as needed for up to 21 doses. 21 tablet 0   No facility-administered medications prior to visit.     Per HPI unless specifically indicated in ROS section below Review of Systems  Objective:  BP 136/68   Pulse 81   Temp 98.1 F (36.7 C) (Temporal)   Ht _0  (1.753 m)   Wt 178 lb 12.8 oz (81.1 kg)   SpO2 96%   BMI 26.40 kg/m   Wt Readings from Last 3 Encounters:  06/22/22 178 lb 12.8 oz (81.1 kg)  06/10/22 176 lb (79.8 kg)  05/25/22 175 lb (79.4 kg)      Physical Exam Vitals and nursing note reviewed.  Constitutional:      Appearance: Normal appearance. He is not ill-appearing.  Cardiovascular:     Rate and Rhythm: Normal rate and regular rhythm.     Pulses: Normal pulses.     Heart sounds: Normal heart sounds. No murmur heard. Pulmonary:     Effort: Pulmonary effort is normal. No respiratory distress.     Breath sounds: Normal breath sounds. No wheezing, rhonchi or rales.  Musculoskeletal:        General: No tenderness. Normal range of motion.     Comments:  No pain midline spine No paraspinous mm tenderness Neg SLR bilaterally, mildly positive seated SLR on right No pain with int/ext rotation at hip. No pain at SIJ, GTB bilaterally. Discomfort to palpation of R sciatic notch - pain reproduced with this.   Skin:    General: Skin is warm and dry.     Findings: Rash present.   Neurological:     Mental Status: He is alert.     Motor: Motor function is intact.     Coordination: Coordination is intact.     Gait: Gait abnormal.     Deep Tendon Reflexes:     Reflex Scores:      Patellar reflexes are 2+ on the right side and 2+ on the left side.      Achilles reflexes are 1+  on the right side and 1+ on the left side.    Comments:  5/5 strength BLE Sensation intact to light touch throughout bilateral lower legs Antalgic gait due to R leg pain  Psychiatric:        Mood and Affect: Mood normal.        Behavior: Behavior normal.       Results for orders placed or performed in visit on 06/22/22  Renal function panel  Result Value Ref Range   Sodium 139 135 - 145 mEq/L   Potassium 3.9 3.5 - 5.1 mEq/L   Chloride 99 96 - 112 mEq/L   CO2 30 19 - 32 mEq/L   Albumin 4.2 3.5 - 5.2 g/dL   BUN 13 6 - 23 mg/dL   Creatinine, Ser 1.06 0.40 - 1.50 mg/dL   Glucose, Bld 76 70 - 99 mg/dL   Phosphorus 3.1 2.3 - 4.6 mg/dL   GFR 67.93 >60.00 mL/min   Calcium 9.9 8.4 - 10.5 mg/dL  Rheumatoid factor  Result Value Ref Range   Rhuematoid fact SerPl-aCnc <14 <14 IU/mL  CK  Result Value Ref Range   Total CK 52 7 - 232 U/L  TSH  Result Value Ref Range   TSH 1.90 0.35 - 5.50 uIU/mL  POCT glycosylated hemoglobin (Hb A1C)  Result Value Ref Range   Hemoglobin A1C 12.5 (A) 4.0 - 5.6 %   HbA1c POC (<> result, manual entry)     HbA1c, POC (prediabetic range)     HbA1c, POC (controlled diabetic range)      Assessment & Plan:   Problem List Items Addressed This Visit     Hypertension    Chronic, continue amlodipine 74m daily. Will monitor BP off HCTZ as per below (holding in case contributing to rash)      Lower back pain    H/o herniated disc in the past.  No significant back pain       Relevant Medications   traMADol (ULTRAM) 50 MG tablet   Skin rash    Chronic issue, may have worsened when glipizide was started.  He's also been on hctz - ?sulfa allergy. Recommend  stop hctz and glipizide for 1 month, update with effect on rash.  Previous rash to losartan.       Type 2 diabetes mellitus with other specified complication (HCC) - Primary    Marked deterioration earlier this year after steroid course.  We initially started glipizide then lantus. Also lowered metformin dose due to acute kidney injury at that time. He's done well on basal insulin, 12u lantus daily with sugars consistently staying <150.  Will stop glipizide at this time as per above for rash, continue metformin and lantus. Discussed may likely need to titrate lantus to goal fasting cbg <150. If lantus continued, he would be candidate for continuous glucose monitor.  RTC 4 wks close f/u visit.       Relevant Orders   POCT glycosylated hemoglobin (Hb A1C) (Completed)   Renal function panel (Completed)   Rheumatoid factor (Completed)   CK (Completed)   TSH (Completed)   DG Lumbar Spine Complete   Right leg pain    Present most of this year, anticipate multifactorial including known severe hip osteoarthritis, pseudogout of knee, and possible lumbar radiculopathy given description of pain. Has had negative evaluation for arterial insufficiency, and symptoms are not consistent with peripheral neuropathy.   Limited treatment options: he did not tolerate colchicine (diarrhea)., we're currently avoiding NSAIDs in recent AKI,  and avoiding prednisone due to severe hyperglycemia. Will Rx tramadol, will consider gabapentin trial for radiculopathy symptoms.   Will proceed evaluation with lumbar films and possibly MRI to evaluate for radiculopathy contribution - pending results discussed PM&R evaluation. He endorses h/o herniated disc remotely when in [place] - no records available. Pain has persisted past 9 months, he has failed conservative management including physical therapy, multiple medications including prednisone, meloxicam, tylenol, and now prescribing tramadol. In statin use, check CPK. In strong fmhx  rheumatoid arthritis, check RF although discussed symptoms not consistent with rheumatism.       Diminished pulses in lower extremity    S/p [facility] eval 06/2022 - leg pain not due to arterial claudication.       Osteoarthritis of right hip    Known h/o severe OA by [place], he saw ortho Dr Gregory Abbott early 2023 and underwent PT course with some benefit. Continues HEP. He feels there's more going on than just R hip osteoarthritis.       Relevant Medications   traMADol (ULTRAM) 50 MG tablet   Calcium pyrophosphate deposition disease (CPPD)    Based on R knee xray.  He did not tolerate colchicine (diarrhea).  Avoiding NSAIDs in recent AKI.  Avoiding prednisone due to severe hyperglycemia.  See below.       Relevant Medications   traMADol (ULTRAM) 50 MG tablet     Meds ordered this encounter  Medications   traMADol (ULTRAM) 50 MG tablet    Sig: Take 1 tablet (50 mg total) by mouth 2 (two) times daily as needed for moderate pain (sedation precautions).    Dispense:  30 tablet    Refill:  0   Orders Placed This Encounter  Procedures   DG Lumbar Spine Complete    Standing Status:   Future    Number of Occurrences:   1    Standing Expiration Date:   06/23/2023    Order Specific Question:   Reason for Exam (SYMPTOM  OR DIAGNOSIS REQUIRED)    Answer:   low back pain with R leg pain    Order Specific Question:   Preferred imaging location?    Answer:   [facility]   Renal function panel   Rheumatoid factor   CK   TSH   POCT glycosylated hemoglobin (Hb A1C)     Patient Instructions  Hold glipizide and hydrochlorothiazide for 1 month to see if rash gets better. Monitor sugars - if fasting sugars staying >150 off glipizide, then bump up lantus.  Labs today.  Lower back xray today - pending results will proceed with lower back MRI.  Try tramadol 25-71m (1/2-1 tablet) twice daily as needed for pain.  Return in 1 month for follow up visit.   Follow up plan: Return in about  4 weeks (around 07/20/2022) for follow up visit.  JRia Bush MD

## 2022-06-23 LAB — RHEUMATOID FACTOR: Rheumatoid fact SerPl-aCnc: <14 IU/mL (ref <14)

## 2022-06-24 ENCOUNTER — Encounter: Payer: Self-pay | Admitting: Family Medicine

## 2022-06-24 ENCOUNTER — Other Ambulatory Visit: Payer: Self-pay | Admitting: Family Medicine

## 2022-06-24 ENCOUNTER — Telehealth: Payer: Self-pay | Admitting: Family Medicine

## 2022-06-24 ENCOUNTER — Encounter: Payer: Self-pay | Admitting: *Deleted

## 2022-06-24 DIAGNOSIS — M5441 Lumbago with sciatica, right side: Secondary | ICD-10-CM

## 2022-06-24 DIAGNOSIS — M79604 Pain in right leg: Secondary | ICD-10-CM

## 2022-06-24 NOTE — Assessment & Plan Note (Signed)
Chronic issue, may have worsened when glipizide was started.  He's also been on hctz - ?sulfa allergy. Recommend stop hctz and glipizide for 1 month, update with effect on rash.  Previous rash to losartan.

## 2022-06-24 NOTE — Assessment & Plan Note (Addendum)
Present most of this year, anticipate multifactorial including known severe hip osteoarthritis, pseudogout of knee, and possible lumbar radiculopathy given description of pain. Has had negative evaluation for arterial insufficiency, and symptoms are not consistent with peripheral neuropathy.   Limited treatment options: he did not tolerate colchicine (diarrhea)., we're currently avoiding NSAIDs in recent AKI, and avoiding prednisone due to severe hyperglycemia. Will Rx tramadol, will consider gabapentin trial for radiculopathy symptoms.   Will proceed evaluation with lumbar films and possibly MRI to evaluate for radiculopathy contribution - pending results discussed PM&R evaluation. He endorses h/o herniated disc remotely when in [place] - no records available. Pain has persisted past 9 months, he has failed conservative management including physical therapy, multiple medications including prednisone, meloxicam, tylenol, and now prescribing tramadol. In statin use, check CPK. In strong fmhx rheumatoid arthritis, check RF although discussed symptoms not consistent with rheumatism.

## 2022-06-24 NOTE — Assessment & Plan Note (Addendum)
Known h/o severe OA by xrays, he saw ortho Dr Tamera Punt early 2023 and underwent PT course with some benefit. Continues HEP. He feels there's more going on than just R hip osteoarthritis.

## 2022-06-24 NOTE — Assessment & Plan Note (Signed)
Chronic, continue amlodipine '5mg'$  daily. Will monitor BP off HCTZ as per below (holding in case contributing to rash)

## 2022-06-24 NOTE — Telephone Encounter (Signed)
Pt called in stated he was returning call from the office  # [phone]

## 2022-06-24 NOTE — Assessment & Plan Note (Addendum)
S/p [facility]VS eval 06/2022 - leg pain not due to arterial claudication.

## 2022-06-24 NOTE — Assessment & Plan Note (Signed)
H/o herniated disc in the past.  No significant back pain

## 2022-06-24 NOTE — Assessment & Plan Note (Addendum)
Marked deterioration earlier this year after steroid course.  We initially started glipizide then lantus. Also lowered metformin dose due to acute kidney injury at that time. He's done well on basal insulin, 12u lantus daily with sugars consistently staying <150.  Will stop glipizide at this time as per above for rash, continue metformin and lantus. Discussed may likely need to titrate lantus to goal fasting cbg <150. If lantus continued, he would be candidate for continuous glucose monitor.  RTC 4 wks close f/u visit.

## 2022-06-24 NOTE — Assessment & Plan Note (Signed)
Based on R knee xray.  He did not tolerate colchicine (diarrhea).  Avoiding NSAIDs in recent AKI.  Avoiding prednisone due to severe hyperglycemia.  See below.

## 2022-06-28 NOTE — Telephone Encounter (Signed)
Pt aware of x-ray results. (See Imaging, Results Notes- 06/22/22.)

## 2022-07-06 ENCOUNTER — Ambulatory Visit
Admission: RE | Admit: 2022-07-06 | Discharge: 2022-07-06 | Disposition: A | Discharge status: Home / Self Care | Payer: Medicare Other | Source: Ambulatory Visit | Attending: Family Medicine | Admitting: Family Medicine

## 2022-07-06 DIAGNOSIS — M4726 Other spondylosis with radiculopathy, lumbar region: Secondary | ICD-10-CM | POA: Diagnosis not present

## 2022-07-06 DIAGNOSIS — M5441 Lumbago with sciatica, right side: Secondary | ICD-10-CM

## 2022-07-06 DIAGNOSIS — M79604 Pain in right leg: Secondary | ICD-10-CM

## 2022-07-06 DIAGNOSIS — M4807 Spinal stenosis, lumbosacral region: Secondary | ICD-10-CM | POA: Diagnosis not present

## 2022-07-06 DIAGNOSIS — M5116 Intervertebral disc disorders with radiculopathy, lumbar region: Secondary | ICD-10-CM | POA: Diagnosis not present

## 2022-07-11 NOTE — Therapy (Unsigned)
OUTPATIENT PHYSICAL THERAPY THORACOLUMBAR EVALUATION   Patient Name: Gregory Abbott MRN: [id number] DOB:1946-01-21, 77 y.o., male Today's Date: 07/11/2022  END OF SESSION:   Past Medical History:  Diagnosis Date   Blood transfusion without reported diagnosis    BPH (benign prostatic hypertrophy)    Cataract    Childhood asthma    COPD (chronic obstructive pulmonary disease) (HCC)    Eczema    Ex-smoker    14 PY hx   GERD (gastroesophageal reflux disease)    History of chicken pox    Hypertension    Lower back pain    h/o herniated and bulging discs lower back   Osteoarthritis of neck    Personal history of colonic polyps 05/07/2013   resected not retrieved   PUD (peptic ulcer disease)    S/P splenectomy    partial   Shortness of breath    Past Surgical History:  Procedure Laterality Date   AAA screen  04/2013   neg for AAA   CATARACT EXTRACTION Left 05/2013   [facility]   CATARACT EXTRACTION W/PHACO Right 10/06/2015   Procedure: CATARACT EXTRACTION PHACO AND INTRAOCULAR LENS PLACEMENT ([place]);  Surgeon: Birder Robson, MD;  Location: [facility] ORS;  Service: Ophthalmology;  Laterality: Right;  [place] AP% CDE fluid pack lot [id number] H exp 04/02/2017   COLONOSCOPY  2009   COLONOSCOPY  05/2013   1 polyp, int/ext hemorrhoids Carlean Purl)   COLONOSCOPY  08/2018   Diminutive adenoma, no f/u planned Carlean Purl)   KNEE ARTHROSCOPY     lower extremity arterial doppler  2013   WNL   SPLENECTOMY, PARTIAL  1998   fall down stairs   Patient Active Problem List   Diagnosis Date Noted   Atherosclerosis of aorta ([place]) 05/30/2022   Acute kidney injury ([place]) 05/30/2022   History of partial splenectomy 05/30/2022   Osteoarthritis of right hip 04/13/2022   Calcium pyrophosphate deposition disease (CPPD) 04/13/2022   Diminished pulses in lower extremity 01/14/2022   Right leg pain 10/23/2021   Cerumen impaction 10/20/2020   Abnormal lung sounds 06/26/2019   Allergic rhinitis, seasonal  04/20/2018   Erectile dysfunction of organic origin 06/08/2017   Subclinical hyperthyroidism 05/10/2016   Hyperlipidemia associated with type 2 diabetes mellitus ([place]) 05/01/2016   GERD (gastroesophageal reflux disease) 01/27/2016   Advanced care planning/counseling discussion 04/03/2015   Type 2 diabetes mellitus with other specified complication ([place]) 14/78/2956   COPD (chronic obstructive pulmonary disease) ([place]) 12/10/2013   Skin rash 09/09/2013   Medicare annual wellness visit, subsequent 02/01/2013   Ex-smoker 10/22/2012   Hypertension    Lower back pain     PCP: Ria Bush, MD   REFERRING PROVIDER: Ria Bush, MD   REFERRING DIAG: M54.41 (ICD-10-CM) - Midline low back pain with right-sided sciatica, unspecified chronicity M79.604 (ICD-10-CM) - Right leg pain  Rationale for Evaluation and Treatment: Rehabilitation  THERAPY DIAG: Midline low back pain with right-sided sciatica, unspecified chronicity   ONSET DATE: chronic  SUBJECTIVE:  SUBJECTIVE STATEMENT: ***  PERTINENT HISTORY:  ***  PAIN:  Are you having pain? {OPRCPAIN:27236}  PRECAUTIONS: None  WEIGHT BEARING RESTRICTIONS: No  FALLS:  Has patient fallen in last 6 months? No  LIVING ENVIRONMENT: Lives with: lives with their family  OCCUPATION: retired  PLOF: Independent  PATIENT GOALS: ***  NEXT MD VISIT:   OBJECTIVE:   DIAGNOSTIC FINDINGS:  CLINICAL DATA:  Lumbar radiculopathy.  Right leg pain   EXAM: MRI LUMBAR SPINE WITHOUT CONTRAST   TECHNIQUE: Multiplanar, multisequence MR imaging of the lumbar spine was performed. No intravenous contrast was administered.   COMPARISON:  Lumbar radiographs 06/22/2022   FINDINGS: Segmentation: 5 lumbar vertebra. Lowest disc space L5-S1.  Normal segmentation   Alignment:  Mild retrolisthesis L1-2.  Mild anterolisthesis L4-5   Vertebrae: Schmorl's nodes at L1-2 and L3-4. Negative for fracture or mass   Conus medullaris and cauda equina: Conus extends to the L2-3 level. Conus and cauda equina appear normal.   Paraspinal and other soft tissues: Synovial cyst posterior to the facet joints on the right at L2-3. No neural impingement. No soft tissue mass or adenopathy identified.   Disc levels:   L1-2: Mild disc and mild facet degeneration. Mild subarticular stenosis bilaterally   L2-3: Disc bulging and facet degeneration. Mild subarticular stenosis bilaterally.   L3-4: Mild disc bulging and bilateral facet degeneration. Subarticular stenosis left greater than right. Possible left L4 nerve root impingement. Central canal patent   L4-5: Mild anterolisthesis with advanced facet degeneration. Diffuse disc bulging and endplate spurring. Moderate central canal stenosis and moderate subarticular and foraminal stenosis bilaterally   L5-S1: Moderate disc degeneration with disc space narrowing and mild spurring. Mild facet degeneration. Mild subarticular stenosis bilaterally.   IMPRESSION: 1. Multilevel degenerative change throughout the lumbar spine. 2. Subarticular stenosis left greater than right L3-4. 3. Moderate central canal stenosis L4-5 with moderate subarticular and foraminal stenosis bilaterally. 4. Mild subarticular stenosis bilaterally L5-S1.     Electronically Signed   By: Franchot Gallo M.D.   On: 07/07/2022 16:42    PATIENT SURVEYS:  FOTO ***  SCREENING FOR RED FLAGS: negative  COGNITION: Overall cognitive status: Within functional limits for tasks assessed     SENSATION: Not tested  MUSCLE LENGTH: Hamstrings: Right *** deg; Left *** deg Marcello Moores test: Right *** deg; Left *** deg  POSTURE: {posture:25561}  PALPATION: ***  LUMBAR ROM:   AROM eval  Flexion   Extension   Right  lateral flexion   Left lateral flexion   Right rotation   Left rotation    (Blank rows = not tested)  LOWER EXTREMITY ROM:     {AROM/PROM:27142}  Right eval Left eval  Hip flexion    Hip extension    Hip abduction    Hip adduction    Hip internal rotation    Hip external rotation    Knee flexion    Knee extension    Ankle dorsiflexion    Ankle plantarflexion    Ankle inversion    Ankle eversion     (Blank rows = not tested)  LOWER EXTREMITY MMT:    MMT Right eval Left eval  Hip flexion    Hip extension    Hip abduction    Hip adduction    Hip internal rotation    Hip external rotation    Knee flexion    Knee extension    Ankle dorsiflexion    Ankle plantarflexion    Ankle inversion    Ankle  eversion     (Blank rows = not tested)  LUMBAR SPECIAL TESTS:  Straight leg raise test: {pos/neg:25243}, Slump test: {pos/neg:25243}, FABER test: {pos/neg:25243}, and Thomas test: {pos/neg:25243}  FUNCTIONAL TESTS:  {Functional tests:24029}  GAIT: Distance walked: 54fx2 Assistive device utilized: {Assistive devices:23999} Level of assistance: {Levels of assistance:24026} Comments: ***  TODAY'S TREATMENT:                                                                                                                              DATE: ***    PATIENT EDUCATION:  Education details: Discussed eval findings, rehab rationale and POC and patient is in agreement  Person educated: Patient Education method: Explanation Education comprehension: verbalized understanding and needs further education  HOME EXERCISE PROGRAM: ***  ASSESSMENT:  CLINICAL IMPRESSION: Patient is a *** y.o. *** who was seen today for physical therapy evaluation and treatment for ***.   OBJECTIVE IMPAIRMENTS: {opptimpairments:25111}.   ACTIVITY LIMITATIONS: {activitylimitations:27494}  PARTICIPATION LIMITATIONS: {participationrestrictions:25113}  PERSONAL FACTORS: {Personal factors:25162}  are also affecting patient's functional outcome.   REHAB POTENTIAL: {rehabpotential:25112}  CLINICAL DECISION MAKING: {clinical decision making:25114}  EVALUATION COMPLEXITY: {Evaluation complexity:25115}   GOALS: Goals reviewed with patient? {yes/no:20286}  SHORT TERM GOALS: Target date: ***  *** Baseline: Goal status: {GOALSTATUS:25110}  2.  *** Baseline:  Goal status: {GOALSTATUS:25110}  3.  *** Baseline:  Goal status: {GOALSTATUS:25110}  4.  *** Baseline:  Goal status: {GOALSTATUS:25110}  5.  *** Baseline:  Goal status: {GOALSTATUS:25110}  6.  *** Baseline:  Goal status: {GOALSTATUS:25110}  LONG TERM GOALS: Target date: ***  *** Baseline:  Goal status: {GOALSTATUS:25110}  2.  *** Baseline:  Goal status: {GOALSTATUS:25110}  3.  *** Baseline:  Goal status: {GOALSTATUS:25110}  4.  *** Baseline:  Goal status: {GOALSTATUS:25110}  5.  *** Baseline:  Goal status: {GOALSTATUS:25110}  6.  *** Baseline:  Goal status: {GOALSTATUS:25110}  PLAN:  PT FREQUENCY: {rehab frequency:25116}  PT DURATION: {rehab duration:25117}  PLANNED INTERVENTIONS: {rehab planned interventions:25118::"Therapeutic exercises","Therapeutic activity","Neuromuscular re-education","Balance training","Gait training","Patient/Family education","Self Care","Joint mobilization"}.  PLAN FOR NEXT SESSION: ***   JLanice Shirts PT 07/11/2022, 2:09 PM

## 2022-07-13 ENCOUNTER — Other Ambulatory Visit: Payer: Self-pay

## 2022-07-13 ENCOUNTER — Ambulatory Visit: Payer: Medicare Other | Attending: Family Medicine

## 2022-07-13 ENCOUNTER — Ambulatory Visit: Payer: BLUE CROSS/BLUE SHIELD | Admitting: Family Medicine

## 2022-07-13 DIAGNOSIS — M5459 Other low back pain: Secondary | ICD-10-CM | POA: Diagnosis not present

## 2022-07-13 DIAGNOSIS — M79604 Pain in right leg: Secondary | ICD-10-CM | POA: Diagnosis not present

## 2022-07-13 DIAGNOSIS — M5416 Radiculopathy, lumbar region: Secondary | ICD-10-CM | POA: Insufficient documentation

## 2022-07-17 ENCOUNTER — Telehealth: Payer: Self-pay | Admitting: Family Medicine

## 2022-07-17 DIAGNOSIS — M79604 Pain in right leg: Secondary | ICD-10-CM

## 2022-07-17 DIAGNOSIS — M5441 Lumbago with sciatica, right side: Secondary | ICD-10-CM

## 2022-07-17 NOTE — Telephone Encounter (Signed)
Late entry - I spoke with patient last week regarding latest referral. I referred him to PM&R (physiatry) however referral was erroneously sent to PT department.  He has had 1 PT session and is ok with continuing at this time. I do want him to see PM&R for evaluation - new referral placed.

## 2022-07-19 ENCOUNTER — Ambulatory Visit: Payer: Medicare Other

## 2022-07-19 DIAGNOSIS — M79604 Pain in right leg: Secondary | ICD-10-CM | POA: Diagnosis not present

## 2022-07-19 DIAGNOSIS — M5416 Radiculopathy, lumbar region: Secondary | ICD-10-CM | POA: Diagnosis not present

## 2022-07-19 DIAGNOSIS — M5459 Other low back pain: Secondary | ICD-10-CM

## 2022-07-19 NOTE — Therapy (Signed)
OUTPATIENT PHYSICAL THERAPY TREATMENT NOTE   Patient Name: Gregory Abbott MRN: [id number] DOB:09/14/1945, 77 y.o., male Today's Date: 07/19/2022  PCP: Ria Bush, MD   REFERRING PROVIDER: Ria Bush, MD    END OF SESSION:   PT End of Session - 07/19/22 0947     Visit Number 2    Number of Visits 12    Date for PT Re-Evaluation 09/07/22    Authorization Type MCR    PT Start Time 1000    PT Stop Time 1038    PT Time Calculation (min) 38 min    Activity Tolerance Patient limited by pain    Behavior During Therapy WFL for tasks assessed/performed             Past Medical History:  Diagnosis Date   Blood transfusion without reported diagnosis    BPH (benign prostatic hypertrophy)    Cataract    Childhood asthma    COPD (chronic obstructive pulmonary disease) ([place])    Eczema    Ex-smoker    49 PY hx   GERD (gastroesophageal reflux disease)    History of chicken pox    Hypertension    Lower back pain    h/o herniated and bulging discs lower back   Osteoarthritis of neck    Personal history of colonic polyps 05/07/2013   resected not retrieved   PUD (peptic ulcer disease)    S/P splenectomy    partial   Shortness of breath    Past Surgical History:  Procedure Laterality Date   AAA screen  04/2013   neg for AAA   CATARACT EXTRACTION Left 05/2013   [facility]   CATARACT EXTRACTION W/PHACO Right 10/06/2015   Procedure: CATARACT EXTRACTION PHACO AND INTRAOCULAR LENS PLACEMENT (IOC);  Surgeon: Birder Robson, MD;  Location: [facility] ORS;  Service: Ophthalmology;  Laterality: Right;  [place] AP% CDE fluid pack lot [id number] H exp 04/02/2017   COLONOSCOPY  2009   COLONOSCOPY  05/2013   1 polyp, int/ext hemorrhoids Carlean Purl)   COLONOSCOPY  08/2018   Diminutive adenoma, no f/u planned Carlean Purl)   KNEE ARTHROSCOPY     lower extremity arterial doppler  2013   WNL   SPLENECTOMY, PARTIAL  1998   fall down stairs   Patient Active Problem List   Diagnosis  Date Noted   Atherosclerosis of aorta ([place]) 05/30/2022   Acute kidney injury ([place]) 05/30/2022   History of partial splenectomy 05/30/2022   Osteoarthritis of right hip 04/13/2022   Calcium pyrophosphate deposition disease (CPPD) 04/13/2022   Diminished pulses in lower extremity 01/14/2022   Right leg pain 10/23/2021   Cerumen impaction 10/20/2020   Abnormal lung sounds 06/26/2019   Allergic rhinitis, seasonal 04/20/2018   Erectile dysfunction of organic origin 06/08/2017   Subclinical hyperthyroidism 05/10/2016   Hyperlipidemia associated with type 2 diabetes mellitus ([name]) 05/01/2016   GERD (gastroesophageal reflux disease) 01/27/2016   Advanced care planning/counseling discussion 04/03/2015   Type 2 diabetes mellitus with other specified complication ([name]) 34/74/2595   COPD (chronic obstructive pulmonary disease) ([place]) 12/10/2013   Skin rash 09/09/2013   Medicare annual wellness visit, subsequent 02/01/2013   Ex-smoker 10/22/2012   Hypertension    Lower back pain     REFERRING DIAG:  M54.41 (ICD-10-CM) - Midline low back pain with right-sided sciatica, unspecified chronicity M79.604 (ICD-10-CM) - Right leg pain   THERAPY DIAG:  Other low back pain  Radiculopathy, lumbar region  Pain in right leg  Rationale for Evaluation and Treatment  Rehabilitation  PERTINENT HISTORY: Osteoarthritis of right hip     Known h/o severe OA by xrays, he saw ortho Dr Tamera Punt early 2023 and underwent PT course with some benefit. Continues HEP. He feels there's more going on than just R hip osteoarthritis.     PRECAUTIONS: None  SUBJECTIVE:                                                                                                                                                                                      SUBJECTIVE STATEMENT:  Patient reports that his pain is the worst when he first gets up, but gets better as his walks.    PAIN:  Are you having pain? Yes: NPRS scale:  3-8/10 Pain location: back and RLE Pain description: ache Aggravating factors: hip flexion, putting on socks Relieving factors: position changes   OBJECTIVE: (objective measures completed at initial evaluation unless otherwise dated)   DIAGNOSTIC FINDINGS:  CLINICAL DATA:  Lumbar radiculopathy.  Right leg pain   EXAM: MRI LUMBAR SPINE WITHOUT CONTRAST   TECHNIQUE: Multiplanar, multisequence MR imaging of the lumbar spine was performed. No intravenous contrast was administered.   COMPARISON:  Lumbar radiographs 06/22/2022   FINDINGS: Segmentation: 5 lumbar vertebra. Lowest disc space L5-S1. Normal segmentation   Alignment:  Mild retrolisthesis L1-2.  Mild anterolisthesis L4-5   Vertebrae: Schmorl's nodes at L1-2 and L3-4. Negative for fracture or mass   Conus medullaris and cauda equina: Conus extends to the L2-3 level. Conus and cauda equina appear normal.   Paraspinal and other soft tissues: Synovial cyst posterior to the facet joints on the right at L2-3. No neural impingement. No soft tissue mass or adenopathy identified.   Disc levels:   L1-2: Mild disc and mild facet degeneration. Mild subarticular stenosis bilaterally   L2-3: Disc bulging and facet degeneration. Mild subarticular stenosis bilaterally.   L3-4: Mild disc bulging and bilateral facet degeneration. Subarticular stenosis left greater than right. Possible left L4 nerve root impingement. Central canal patent   L4-5: Mild anterolisthesis with advanced facet degeneration. Diffuse disc bulging and endplate spurring. Moderate central canal stenosis and moderate subarticular and foraminal stenosis bilaterally   L5-S1: Moderate disc degeneration with disc space narrowing and mild spurring. Mild facet degeneration. Mild subarticular stenosis bilaterally.   IMPRESSION: 1. Multilevel degenerative change throughout the lumbar spine. 2. Subarticular stenosis left greater than right L3-4. 3. Moderate  central canal stenosis L4-5 with moderate subarticular and foraminal stenosis bilaterally. 4. Mild subarticular stenosis bilaterally L5-S1.     Electronically Signed   By: Franchot Gallo M.D.   On: 07/07/2022 16:42     PATIENT SURVEYS:  ODI 7/50 (  14% disability)   SCREENING FOR RED FLAGS: negative   COGNITION: Overall cognitive status: Within functional limits for tasks assessed                          SENSATION: Not tested   MUSCLE LENGTH: Hamstrings: Right 70 deg; Left 70 deg Thomas test: neg R   POSTURE: rounded shoulders, forward head, and decreased lumbar lordosis   PALPATION: deferred   LUMBAR ROM:    AROM eval  Flexion 75%  Extension 25%  Right lateral flexion 25%  Left lateral flexion 25%  Right rotation 75%  Left rotation 75%   (Blank rows = not tested)   LOWER EXTREMITY ROM:      Active  Right eval Left eval  Hip flexion 120d    Hip extension 10d    Hip abduction      Hip adduction      Hip internal rotation      Hip external rotation      Knee flexion      Knee extension      Ankle dorsiflexion      Ankle plantarflexion      Ankle inversion      Ankle eversion       (Blank rows = not tested)   LOWER EXTREMITY MMT:     MMT Right eval Left eval  Hip flexion 4    Hip extension 4    Hip abduction 4    Hip adduction      Hip internal rotation      Hip external rotation      Knee flexion 4    Knee extension 4    Ankle dorsiflexion      Ankle plantarflexion      Ankle inversion      Ankle eversion      Trunk/core 3 3   (Blank rows = not tested)   LUMBAR SPECIAL TESTS:  Straight leg raise test: Negative, Slump test: Positive, FABER test: restricted and painful in hip, and Thomas test: Negative   FUNCTIONAL TESTS:  30s chair stand 1 rep   GAIT: Distance walked: 27fx2 Assistive device utilized: Single point cane Level of assistance: Complete Independence Comments: antalgic gait   TODAY'S TREATMENT:                                        [facility] Adult PT Treatment:                                                DATE: 07/19/2022 Therapeutic Exercise: Nustep level 5 x 5 mins Seated hamstring stretch 2x30" BIL PPT 5" hold 2x10 SLR x10 BIL (pain and small ROM on Rt) Supine sciatic nerve glides x20 Rt Supine figure 4 piriformis stretch push pull 2x30" each Rt Bridges 2x10 Sidelying hip abduction 2x10 BIL LTR x10 BIL Open books x10 BIL STS hands on knees, heavy lean to L  DATE: 07/13/22 Eval and HEP     PATIENT EDUCATION:  Education details: Discussed eval findings, rehab rationale and POC and patient is in agreement  Person educated: Patient Education method: Explanation Education comprehension: verbalized understanding and needs further education   HOME EXERCISE PROGRAM: Access Code: [email or web address] URL: [email or web address] Date: 07/13/2022 Prepared by: Sharlynn Oliphant   Exercises - Supine Bridge  - 2 x daily - 5 x weekly - 1 sets - 15 reps - Clamshell  - 2 x daily - 5 x weekly - 1 sets - 15 reps - Supine Figure 4 Piriformis Stretch  - 2 x daily - 5 x weekly - 1 sets - 2 reps - 30s hold   ASSESSMENT:   CLINICAL IMPRESSION: Patient presents to PT with continued pain in his RLE and reports HEP compliance. Session today focused on proximal hip and core strengthening. Patient was able to tolerate all prescribed exercises with no adverse effects. The most difficult exercise was SLR on the Rt, increased pain and was only able to do in a small ROM. He displays heavy reliance on LLE with STS, able to somewhat correct with cues. Patient continues to benefit from skilled PT services and should be progressed as able to improve functional independence.     OBJECTIVE IMPAIRMENTS: .Abnormal gait, decreased activity tolerance, decreased knowledge of condition, decreased knowledge of use of DME, decreased mobility,  difficulty walking, decreased strength, increased fascial restrictions, increased muscle spasms, impaired flexibility, and pain    ACTIVITY LIMITATIONS: carrying, lifting, bending, standing, stairs, and bed mobility   PERSONAL FACTORS: Age, Fitness, Past/current experiences, Time since onset of injury/illness/exacerbation, and 1-2 comorbidities: HNP and R hip OA  are also affecting patient's functional outcome.    REHAB POTENTIAL: Fair based on chronicity and unsuccessful conservative treatment to date   CLINICAL DECISION MAKING: Evolving/moderate complexity   EVALUATION COMPLEXITY: Low     GOALS: Goals reviewed with patient? No   SHORT TERM GOALS: Target date: 08/03/2022   Patient to demonstrate independence in HEP  Baseline: [id number] Goal status: INITIAL   2.  Increase deficit lumbar AROM to 50% Baseline:  AROM eval  Flexion 75%  Extension 25%  Right lateral flexion 25%  Left lateral flexion 25%  Right rotation 75%  Left rotation 75%    Goal status: INITIAL       LONG TERM GOALS: Target date: 08/24/2022   Increase trunk and core strength to 3+/5 Baseline: 3/5 trunk strength Goal status: INITIAL   2.  Decrease worst pain to 6/10 Baseline: 8/10 Goal status: INITIAL   3.  Increase lumbar SB to 75% B Baseline: 25% B Goal status: INITIAL   4.  Increase 30s chair stand score to 5 arms crossed Baseline: 1 rep arms crossed Goal status: INITIAL       PLAN:   PT FREQUENCY: 2x/week   PT DURATION: 6 weeks   PLANNED INTERVENTIONS: Therapeutic exercises, Therapeutic activity, Neuromuscular re-education, Balance training, Gait training, Patient/Family education, Self Care, Joint mobilization, Stair training, Aquatic Therapy, Manual therapy, and Re-evaluation.   PLAN FOR NEXT SESSION: HEP review, R hip stretch and strength, piriformis stretch, posture training and sciatic glides.   Margarette Canada, PTA 07/19/2022, 10:37 AM

## 2022-07-20 ENCOUNTER — Encounter: Payer: Self-pay | Admitting: Physical Medicine and Rehabilitation

## 2022-07-20 NOTE — Therapy (Signed)
OUTPATIENT PHYSICAL THERAPY TREATMENT NOTE   Patient Name: Gregory Abbott MRN: [id number] DOB:April 26, 1946, 77 y.o., male Today's Date: 07/21/2022  PCP: Ria Bush, MD   REFERRING PROVIDER: Ria Bush, MD    END OF SESSION:   PT End of Session - 07/21/22 1041     Visit Number 3    Number of Visits 12    Date for PT Re-Evaluation 09/07/22    Authorization Type MCR    PT Start Time 1042    PT Stop Time 1125    PT Time Calculation (min) 43 min    Activity Tolerance Patient limited by pain    Behavior During Therapy WFL for tasks assessed/performed              Past Medical History:  Diagnosis Date   Blood transfusion without reported diagnosis    BPH (benign prostatic hypertrophy)    Cataract    Childhood asthma    COPD (chronic obstructive pulmonary disease) ([place])    Eczema    Ex-smoker    9 PY hx   GERD (gastroesophageal reflux disease)    History of chicken pox    Hypertension    Lower back pain    h/o herniated and bulging discs lower back   Osteoarthritis of neck    Personal history of colonic polyps 05/07/2013   resected not retrieved   PUD (peptic ulcer disease)    S/P splenectomy    partial   Shortness of breath    Past Surgical History:  Procedure Laterality Date   AAA screen  04/2013   neg for AAA   CATARACT EXTRACTION Left 05/2013   [facility]   CATARACT EXTRACTION W/PHACO Right 10/06/2015   Procedure: CATARACT EXTRACTION PHACO AND INTRAOCULAR LENS PLACEMENT (IOC);  Surgeon: Birder Robson, MD;  Location: [facility] ORS;  Service: Ophthalmology;  Laterality: Right;  [place] AP% CDE fluid pack lot [id number] H exp 04/02/2017   COLONOSCOPY  2009   COLONOSCOPY  05/2013   1 polyp, int/ext hemorrhoids Carlean Purl)   COLONOSCOPY  08/2018   Diminutive adenoma, no f/u planned Carlean Purl)   KNEE ARTHROSCOPY     lower extremity arterial doppler  2013   WNL   SPLENECTOMY, PARTIAL  1998   fall down stairs   Patient Active Problem List   Diagnosis  Date Noted   Atherosclerosis of aorta ([place]) 05/30/2022   Acute kidney injury ([place]) 05/30/2022   History of partial splenectomy 05/30/2022   Osteoarthritis of right hip 04/13/2022   Calcium pyrophosphate deposition disease (CPPD) 04/13/2022   Diminished pulses in lower extremity 01/14/2022   Right leg pain 10/23/2021   Cerumen impaction 10/20/2020   Abnormal lung sounds 06/26/2019   Allergic rhinitis, seasonal 04/20/2018   Erectile dysfunction of organic origin 06/08/2017   Subclinical hyperthyroidism 05/10/2016   Hyperlipidemia associated with type 2 diabetes mellitus ([place]) 05/01/2016   GERD (gastroesophageal reflux disease) 01/27/2016   Advanced care planning/counseling discussion 04/03/2015   Type 2 diabetes mellitus with other specified complication ([place]) 90/21/1155   COPD (chronic obstructive pulmonary disease) ([place]) 12/10/2013   Skin rash 09/09/2013   Medicare annual wellness visit, subsequent 02/01/2013   Ex-smoker 10/22/2012   Hypertension    Lower back pain     REFERRING DIAG:  M54.41 (ICD-10-CM) - Midline low back pain with right-sided sciatica, unspecified chronicity M79.604 (ICD-10-CM) - Right leg pain   THERAPY DIAG:  Other low back pain  Radiculopathy, lumbar region  Pain in right leg  Rationale for Evaluation and  Treatment Rehabilitation  PERTINENT HISTORY: Osteoarthritis of right hip     Known h/o severe OA by xrays, he saw ortho Dr Tamera Punt early 2023 and underwent PT course with some benefit. Continues HEP. He feels there's more going on than just R hip osteoarthritis.     PRECAUTIONS: None  SUBJECTIVE:                                                                                                                                                                                      SUBJECTIVE STATEMENT: Patent reports that he pain felt better this morning when he got up, but is still around the same 8/10 most of the time. He states that he will be getting  a steroid injection on 08/01/22 which he is hopeful will help his pain.   PAIN:  Are you having pain? Yes: NPRS scale: 5-8/10 Pain location: back and RLE Pain description: ache Aggravating factors: hip flexion, putting on socks Relieving factors: position changes   OBJECTIVE: (objective measures completed at initial evaluation unless otherwise dated)   DIAGNOSTIC FINDINGS:  CLINICAL DATA:  Lumbar radiculopathy.  Right leg pain   EXAM: MRI LUMBAR SPINE WITHOUT CONTRAST   TECHNIQUE: Multiplanar, multisequence MR imaging of the lumbar spine was performed. No intravenous contrast was administered.   COMPARISON:  Lumbar radiographs 06/22/2022   FINDINGS: Segmentation: 5 lumbar vertebra. Lowest disc space L5-S1. Normal segmentation   Alignment:  Mild retrolisthesis L1-2.  Mild anterolisthesis L4-5   Vertebrae: Schmorl's nodes at L1-2 and L3-4. Negative for fracture or mass   Conus medullaris and cauda equina: Conus extends to the L2-3 level. Conus and cauda equina appear normal.   Paraspinal and other soft tissues: Synovial cyst posterior to the facet joints on the right at L2-3. No neural impingement. No soft tissue mass or adenopathy identified.   Disc levels:   L1-2: Mild disc and mild facet degeneration. Mild subarticular stenosis bilaterally   L2-3: Disc bulging and facet degeneration. Mild subarticular stenosis bilaterally.   L3-4: Mild disc bulging and bilateral facet degeneration. Subarticular stenosis left greater than right. Possible left L4 nerve root impingement. Central canal patent   L4-5: Mild anterolisthesis with advanced facet degeneration. Diffuse disc bulging and endplate spurring. Moderate central canal stenosis and moderate subarticular and foraminal stenosis bilaterally   L5-S1: Moderate disc degeneration with disc space narrowing and mild spurring. Mild facet degeneration. Mild subarticular stenosis bilaterally.   IMPRESSION: 1. Multilevel  degenerative change throughout the lumbar spine. 2. Subarticular stenosis left greater than right L3-4. 3. Moderate central canal stenosis L4-5 with moderate subarticular and foraminal stenosis bilaterally. 4. Mild subarticular stenosis bilaterally L5-S1.  Electronically Signed   By: Franchot Gallo M.D.   On: 07/07/2022 16:42     PATIENT SURVEYS:  ODI 7/50 (14% disability)   SCREENING FOR RED FLAGS: negative   COGNITION: Overall cognitive status: Within functional limits for tasks assessed                          SENSATION: Not tested   MUSCLE LENGTH: Hamstrings: Right 70 deg; Left 70 deg Thomas test: neg R   POSTURE: rounded shoulders, forward head, and decreased lumbar lordosis   PALPATION: deferred   LUMBAR ROM:    AROM eval  Flexion 75%  Extension 25%  Right lateral flexion 25%  Left lateral flexion 25%  Right rotation 75%  Left rotation 75%   (Blank rows = not tested)   LOWER EXTREMITY ROM:      Active  Right eval Left eval  Hip flexion 120d    Hip extension 10d    Hip abduction      Hip adduction      Hip internal rotation      Hip external rotation      Knee flexion      Knee extension      Ankle dorsiflexion      Ankle plantarflexion      Ankle inversion      Ankle eversion       (Blank rows = not tested)   LOWER EXTREMITY MMT:     MMT Right eval Left eval  Hip flexion 4    Hip extension 4    Hip abduction 4    Hip adduction      Hip internal rotation      Hip external rotation      Knee flexion 4    Knee extension 4    Ankle dorsiflexion      Ankle plantarflexion      Ankle inversion      Ankle eversion      Trunk/core 3 3   (Blank rows = not tested)   LUMBAR SPECIAL TESTS:  Straight leg raise test: Negative, Slump test: Positive, FABER test: restricted and painful in hip, and Thomas test: Negative   FUNCTIONAL TESTS:  30s chair stand 1 rep   GAIT: Distance walked: 6fx2 Assistive device utilized: Single point  cane Level of assistance: Complete Independence Comments: antalgic gait   TODAY'S TREATMENT:        [facility] Adult PT Treatment:                                                DATE: 07/21/2022 Therapeutic Exercise: Nustep level 6 x 5 mins Side stepping at counter RTB at ankles x4 laps Standing hip extension RTB at ankles 2x10 BIL Omega knee extension 5# 2x10 Omega knee flexion 25# 2x10 Seated hamstring stretch 2x30" BIL PPT 5" hold 2x10 SLR x10 BIL (small ROM and pain) Supine sciatic nerve glides x20 Rt Supine figure 4 piriformis stretch push pull 2x30" each BIL Modified thomas stretch EOM x1' BIL Bridges 2x10 LTR x10 BIL                                 [facility] Adult PT Treatment:  DATE: 07/19/2022 Therapeutic Exercise: Nustep level 5 x 5 mins Seated hamstring stretch 2x30" BIL PPT 5" hold 2x10 SLR x10 BIL (pain and small ROM on Rt) Supine sciatic nerve glides x20 Rt Supine figure 4 piriformis stretch push pull 2x30" each Rt Bridges 2x10 Sidelying hip abduction 2x10 BIL LTR x10 BIL Open books x10 BIL STS hands on knees, heavy lean to L                                                                                          DATE: 07/13/22 Eval and HEP     PATIENT EDUCATION:  Education details: Discussed eval findings, rehab rationale and POC and patient is in agreement  Person educated: Patient Education method: Explanation Education comprehension: verbalized understanding and needs further education   HOME EXERCISE PROGRAM: Access Code: [email or web address] URL: [email or web address] Date: 07/13/2022 Prepared by: Sharlynn Oliphant   Exercises - Supine Bridge  - 2 x daily - 5 x weekly - 1 sets - 15 reps - Clamshell  - 2 x daily - 5 x weekly - 1 sets - 15 reps - Supine Figure 4 Piriformis Stretch  - 2 x daily - 5 x weekly - 1 sets - 2 reps - 30s hold   ASSESSMENT:   CLINICAL IMPRESSION: Patient presents to PT with continued  pain in his RLE and reports that he feels like the exercises are helping his pain. Session today focused on proximal hip and LE strengthening as well as stretching for BIL hips. Increased difficulty and resistance as noted to good affect, no increase in pain throughout. Patient was able to tolerate all prescribed exercises with no adverse effects. Patient continues to benefit from skilled PT services and should be progressed as able to improve functional independence.     OBJECTIVE IMPAIRMENTS: .Abnormal gait, decreased activity tolerance, decreased knowledge of condition, decreased knowledge of use of DME, decreased mobility, difficulty walking, decreased strength, increased fascial restrictions, increased muscle spasms, impaired flexibility, and pain    ACTIVITY LIMITATIONS: carrying, lifting, bending, standing, stairs, and bed mobility   PERSONAL FACTORS: Age, Fitness, Past/current experiences, Time since onset of injury/illness/exacerbation, and 1-2 comorbidities: HNP and R hip OA  are also affecting patient's functional outcome.    REHAB POTENTIAL: Fair based on chronicity and unsuccessful conservative treatment to date   CLINICAL DECISION MAKING: Evolving/moderate complexity   EVALUATION COMPLEXITY: Low     GOALS: Goals reviewed with patient? No   SHORT TERM GOALS: Target date: 08/03/2022   Patient to demonstrate independence in HEP  Baseline: [id number] Goal status: INITIAL   2.  Increase deficit lumbar AROM to 50% Baseline:  AROM eval  Flexion 75%  Extension 25%  Right lateral flexion 25%  Left lateral flexion 25%  Right rotation 75%  Left rotation 75%    Goal status: INITIAL       LONG TERM GOALS: Target date: 08/24/2022   Increase trunk and core strength to 3+/5 Baseline: 3/5 trunk strength Goal status: INITIAL   2.  Decrease worst pain to 6/10 Baseline: 8/10 Goal status: INITIAL   3.  Increase lumbar SB to 75% B Baseline: 25% B  Goal status: INITIAL   4.   Increase 30s chair stand score to 5 arms crossed Baseline: 1 rep arms crossed Goal status: INITIAL       PLAN:   PT FREQUENCY: 2x/week   PT DURATION: 6 weeks   PLANNED INTERVENTIONS: Therapeutic exercises, Therapeutic activity, Neuromuscular re-education, Balance training, Gait training, Patient/Family education, Self Care, Joint mobilization, Stair training, Aquatic Therapy, Manual therapy, and Re-evaluation.   PLAN FOR NEXT SESSION: HEP review, R hip stretch and strength, piriformis stretch, posture training and sciatic glides.   Margarette Canada, PTA 07/21/2022, 11:24 AM

## 2022-07-21 ENCOUNTER — Ambulatory Visit: Payer: Medicare Other

## 2022-07-21 DIAGNOSIS — M5416 Radiculopathy, lumbar region: Secondary | ICD-10-CM | POA: Diagnosis not present

## 2022-07-21 DIAGNOSIS — M79604 Pain in right leg: Secondary | ICD-10-CM

## 2022-07-21 DIAGNOSIS — M5459 Other low back pain: Secondary | ICD-10-CM | POA: Diagnosis not present

## 2022-07-25 ENCOUNTER — Ambulatory Visit (INDEPENDENT_AMBULATORY_CARE_PROVIDER_SITE_OTHER): Payer: Medicare Other | Admitting: Family Medicine

## 2022-07-25 ENCOUNTER — Encounter: Payer: Self-pay | Admitting: Family Medicine

## 2022-07-25 VITALS — BP 152/72 | HR 76 | Temp 98.3°F | Ht 69.0 in | Wt 178.2 lb

## 2022-07-25 DIAGNOSIS — M5441 Lumbago with sciatica, right side: Secondary | ICD-10-CM

## 2022-07-25 DIAGNOSIS — M1120 Other chondrocalcinosis, unspecified site: Secondary | ICD-10-CM

## 2022-07-25 DIAGNOSIS — M1611 Unilateral primary osteoarthritis, right hip: Secondary | ICD-10-CM | POA: Diagnosis not present

## 2022-07-25 DIAGNOSIS — R21 Rash and other nonspecific skin eruption: Secondary | ICD-10-CM | POA: Diagnosis not present

## 2022-07-25 DIAGNOSIS — M79604 Pain in right leg: Secondary | ICD-10-CM | POA: Diagnosis not present

## 2022-07-25 DIAGNOSIS — E1169 Type 2 diabetes mellitus with other specified complication: Secondary | ICD-10-CM

## 2022-07-25 LAB — BASIC METABOLIC PANEL
BUN: 11 mg/dL (ref 6–23)
CO2: 29 mEq/L (ref 19–32)
Calcium: 10 mg/dL (ref 8.4–10.5)
Chloride: 101 mEq/L (ref 96–112)
Creatinine, Ser: 0.8 mg/dL (ref 0.40–1.50)
GFR: 85.61 mL/min (ref >60.00)
Glucose, Bld: 87 mg/dL (ref 70–99)
Potassium: 4.8 mEq/L (ref 3.5–5.1)
Sodium: 136 mEq/L (ref 135–145)

## 2022-07-25 MED ORDER — GABAPENTIN 100 MG PO CAPS: 100.0000 mg | ORAL_CAPSULE | Freq: Two times a day (BID) | ORAL | 1 refills | Status: DC | PRN

## 2022-07-25 MED ORDER — ACCU-CHEK GUIDE VI STRP: 1.0000 | ORAL_STRIP | Freq: Two times a day (BID) | 3 refills | Status: AC | PRN

## 2022-07-25 NOTE — Progress Notes (Signed)
Patient ID: GEE HABIG, male    DOB: 02/18/1946, 77 y.o.   MRN: [id number]  This visit was conducted in person.  BP (!) 152/72 (BP Location: Right Arm, Cuff Size: Normal)   Pulse 76   Temp 98.3 F (36.8 C) (Temporal)   Ht '5\' 9"'$  (1.753 m)   Wt 178 lb 4 oz (80.9 kg)   SpO2 97%   BMI 26.32 kg/m    CC: 1 mo DM f/u visit  Subjective:   HPI: Gregory Abbott is a 77 y.o. male presenting on 07/25/2022 for Medical Management of Chronic Issues (Here for 1 mo DM f/u. Pt has stopped Lantus. Pt brought in home BP monitor to compare. Reading in office today- ERROR. Thinks it may need new batteries. Pt accompanied by wife, [name]. )   Chronic R leg pain as well as lower back pain - lumbar spine MRI was unrevealing. Known osteoarthritis of R hip, saw ortho last year. Pending physiatrist (PM&R) evaluation, has started PT course which seems to be helping. Tramadol didn't really help pain - more recently taking tylenol '650mg'$  1-2 at a time. Wife's etodolac helped however not taking due to kidney function.   DM - does regularly check sugars and brings log - 100-130 fasting occasional high (175, 222), 100-130 postprandial in pm. Compliant with antihyperglycemic regimen which includes: metformin '500mg'$  BID and diet changes. Previously on sulfonylurea however this was stopped when lantus 10u was started. He subsequently stopped lantus as he may not have been using regularly - stopped last month. Denies low sugars or hypoglycemic symptoms. Denies paresthesias, blurry vision. Last diabetic eye exam 09/2021. Glucometer brand: accuchek. Last foot exam: 01/2022. DSME: declined. Lab Results  Component Value Date   HGBA1C 12.5 (A) 06/22/2022   Diabetic Foot Exam - Simple   No data filed    Lab Results  Component Value Date   MICROALBUR 0.8 10/15/2021     Stopping HCTZ may have helped itching, but itchy patches persist to the arms. TCI cream does help with this. Has not seen derm - defers referral at this  time but would want to go to [place].   HTN - Compliant with current antihypertensive regimen of amlodipine '5mg'$  daily, atenolol '100mg'$  daily. Does check blood pressures at home: having error message on cuff. No low blood pressure readings or symptoms of dizziness/syncope. Denies HA, vision changes, CP/tightness, SOB, leg swelling.      Relevant past medical, surgical, family and social history reviewed and updated as indicated. Interim medical history since our last visit reviewed. Allergies and medications reviewed and updated. Outpatient Medications Prior to Visit  Medication Sig Dispense Refill   Accu-Chek FastClix Lancets MISC E11.69 Use as directed to check sugars daily 100 each 1   Accu-Chek Softclix Lancets lancets Use as instructed to check blood sugar 2 times a day 200 each 3   albuterol (VENTOLIN HFA) 108 (90 Base) MCG/ACT inhaler TAKE 2 PUFFS BY MOUTH EVERY 6 HOURS AS NEEDED FOR WHEEZE OR SHORTNESS OF BREATH 8.5 each 0   amLODipine (NORVASC) 10 MG tablet Take 0.5 tablets (5 mg total) by mouth daily.     aspirin EC 81 MG tablet Take 81 mg by mouth every other day.     atenolol (TENORMIN) 100 MG tablet TAKE 1 TABLET (100 MG TOTAL) BY MOUTH DAILY AFTER BREAKFAST. 90 tablet 3   Blood Glucose Monitoring Suppl (ACCU-CHEK GUIDE) w/Device KIT Use as directed to check sugars daily 1 kit 0  Cholecalciferol (VITAMIN D3) 25 MCG (1000 UT) CAPS Take 1 capsule (1,000 Units total) by mouth daily. (Patient taking differently: Take 1 capsule by mouth daily. Takes about twice a month) 30 capsule    Insulin Pen Needle 31G X 5 MM MISC Use for daily insulin injection 100 each 1   loratadine (CLARITIN) 10 MG tablet Take 1 tablet (10 mg total) by mouth daily. (Patient taking differently: Take 10 mg by mouth daily. As needed)     metFORMIN (GLUCOPHAGE) 1000 MG tablet Take 0.5 tablets (500 mg total) by mouth 2 (two) times daily.     omeprazole (PRILOSEC) 40 MG capsule TAKE 1 CAPSULE (40 MG TOTAL) BY MOUTH DAILY. 90  capsule 3   rosuvastatin (CRESTOR) 5 MG tablet Take 1 tablet (5 mg total) by mouth daily. 90 tablet 3   sildenafil (REVATIO) 20 MG tablet TAKE 2-5 TABLETS BY MOUTH EACH DAY AS NEEDED (RELATIONS) 30 tablet 3   traMADol (ULTRAM) 50 MG tablet Take 1 tablet (50 mg total) by mouth 2 (two) times daily as needed for moderate pain (sedation precautions). 30 tablet 0   triamcinolone cream (KENALOG) 0.1 % Apply topically 2 (two) times daily. 60 g 1   TURMERIC PO Take by mouth daily.     glucose blood (ACCU-CHEK GUIDE) test strip 1 each by Other route 2 (two) times daily as needed for other. E11.65. Use as instructed 100 each 3   insulin glargine (LANTUS SOLOSTAR) 100 UNIT/ML Solostar Pen Inject 10 Units into the skin at bedtime. (Patient not taking: Reported on 07/25/2022) 3 mL 1   No facility-administered medications prior to visit.     Per HPI unless specifically indicated in ROS section below Review of Systems  Objective:  BP (!) 152/72 (BP Location: Right Arm, Cuff Size: Normal)   Pulse 76   Temp 98.3 F (36.8 C) (Temporal)   Ht '5\' 9"'$  (1.753 m)   Wt 178 lb 4 oz (80.9 kg)   SpO2 97%   BMI 26.32 kg/m   Wt Readings from Last 3 Encounters:  07/25/22 178 lb 4 oz (80.9 kg)  06/22/22 178 lb 12.8 oz (81.1 kg)  06/10/22 176 lb (79.8 kg)      Physical Exam Vitals and nursing note reviewed.  Constitutional:      Appearance: Normal appearance. He is not ill-appearing.  HENT:     Head: Normocephalic and atraumatic.     Mouth/Throat:     Mouth: Mucous membranes are moist.     Pharynx: Oropharynx is clear. No oropharyngeal exudate or posterior oropharyngeal erythema.  Eyes:     Extraocular Movements: Extraocular movements intact.     Conjunctiva/sclera: Conjunctivae normal.     Pupils: Pupils are equal, round, and reactive to light.  Cardiovascular:     Rate and Rhythm: Normal rate and regular rhythm.     Pulses: Normal pulses.     Heart sounds: Normal heart sounds. No murmur  heard. Pulmonary:     Effort: Pulmonary effort is normal. No respiratory distress.     Breath sounds: Normal breath sounds. No wheezing, rhonchi or rales.  Skin:    General: Skin is warm and dry.     Findings: Rash (see photos) present. Rash is papular.     Comments: Pruritic dry skin with papules to bilateral lateral arms at elbows  Neurological:     Mental Status: He is alert.  Psychiatric:        Mood and Affect: Mood normal.  Behavior: Behavior normal.      L arm   R arm   Results for orders placed or performed in visit on 94/85/46  Basic metabolic panel  Result Value Ref Range   Sodium 136 135 - 145 mEq/L   Potassium 4.8 3.5 - 5.1 mEq/L   Chloride 101 96 - 112 mEq/L   CO2 29 19 - 32 mEq/L   Glucose, Bld 87 70 - 99 mg/dL   BUN 11 6 - 23 mg/dL   Creatinine, Ser 0.80 0.40 - 1.50 mg/dL   GFR 85.61 >60.00 mL/min   Calcium 10.0 8.4 - 10.5 mg/dL   MR Lumbar Spine Wo Contrast CLINICAL DATA:  Lumbar radiculopathy.  Right leg pain  EXAM: MRI LUMBAR SPINE WITHOUT CONTRAST  TECHNIQUE: Multiplanar, multisequence MR imaging of the lumbar spine was performed. No intravenous contrast was administered.  COMPARISON:  Lumbar radiographs 06/22/2022  FINDINGS: Segmentation: 5 lumbar vertebra. Lowest disc space L5-S1. Normal segmentation  Alignment:  Mild retrolisthesis L1-2.  Mild anterolisthesis L4-5  Vertebrae: Schmorl's nodes at L1-2 and L3-4. Negative for fracture or mass  Conus medullaris and cauda equina: Conus extends to the L2-3 level. Conus and cauda equina appear normal.  Paraspinal and other soft tissues: Synovial cyst posterior to the facet joints on the right at L2-3. No neural impingement. No soft tissue mass or adenopathy identified.  Disc levels:  L1-2: Mild disc and mild facet degeneration. Mild subarticular stenosis bilaterally  L2-3: Disc bulging and facet degeneration. Mild subarticular stenosis bilaterally.  L3-4: Mild disc bulging and  bilateral facet degeneration. Subarticular stenosis left greater than right. Possible left L4 nerve root impingement. Central canal patent  L4-5: Mild anterolisthesis with advanced facet degeneration. Diffuse disc bulging and endplate spurring. Moderate central canal stenosis and moderate subarticular and foraminal stenosis bilaterally  L5-S1: Moderate disc degeneration with disc space narrowing and mild spurring. Mild facet degeneration. Mild subarticular stenosis bilaterally.  IMPRESSION: 1. Multilevel degenerative change throughout the lumbar spine. 2. Subarticular stenosis left greater than right L3-4. 3. Moderate central canal stenosis L4-5 with moderate subarticular and foraminal stenosis bilaterally. 4. Mild subarticular stenosis bilaterally L5-S1.  Electronically Signed   By: Franchot Gallo M.D.   On: 07/07/2022 16:42   Assessment & Plan:   Problem List Items Addressed This Visit     Lower back pain    H/o herniated discs. Recent MRI overall reassuring.  Pending PMR eval.       Skin rash    Generalized pruritus has improved off HCTZ - will add to allergy list.  Rash today suspicious for component of eczema - continue TCI cream, discussed eczema care ie avoiding hot showers, regular moisturizing.  Offered derm eval, he declines for now, will let me know if changes his mind.       Type 2 diabetes mellitus with other specified complication ([place]) - Primary    He self stopped insulin due to improved sugar control. He is now only on metformin '500mg'$  bid. He regularly checks cbg's BID, showing great control to date. Continue current regimen.  It seems pancreas has awoken and is now producing sufficient insulin without need for exogenous insulin administration.  Continue low sugar low carb diet, continue checking sugars regularly. RTC 2 months f/u visit.  Continue to avoid oral steroids.       Relevant Orders   Basic metabolic panel (Completed)   Fructosamine   Right  leg pain    Thought multifactorial (severe R hip OA, pseudogout at knee,  likely component of lumbar radiculopathy). Undergoing PT course with benefit.  Tramadol not really helpful with managing pain, avoid NSAIDs in h/o AKI last year - will trial gabapentin '100mg'$  BID.  Pending PM&R evaluation.       Osteoarthritis of right hip    Known h/o severe R hip osteoarthritis by films last year. However this wouldn't fully explain his description of pain to lower leg.       Calcium pyrophosphate deposition disease (CPPD)     Meds ordered this encounter  Medications   gabapentin (NEURONTIN) 100 MG capsule    Sig: Take 1 capsule (100 mg total) by mouth 2 (two) times daily as needed (leg pain (sedation precautions)).    Dispense:  60 capsule    Refill:  1   glucose blood (ACCU-CHEK GUIDE) test strip    Sig: 1 each by Other route 2 (two) times daily as needed for other. E11.65. Use as instructed    Dispense:  200 each    Refill:  3    Dx E11.65    Orders Placed This Encounter  Procedures   Basic metabolic panel   Fructosamine    Patient Instructions  Labs today  Use regular moisturizer cream to skin twice daily, and triamcinolone once daily for 2 weeks at a time. Let me know if not better for referral to skin doctor in [place].  Use lukewarm showers. Increase amlodipine to '10mg'$  daily. Let me know how BP and sugars are running Keep appt with back doctor next week.  Return in 2 months for follow up visit.  Try gabapentin '100mg'$  1-2 times daily as needed.   Follow up plan: Return in about 2 months (around 09/23/2022), or if symptoms worsen or fail to improve, for follow up visit.  Ria Bush, MD

## 2022-07-25 NOTE — Patient Instructions (Addendum)
Labs today  Use regular moisturizer cream to skin twice daily, and triamcinolone once daily for 2 weeks at a time. Let me know if not better for referral to skin doctor in [place].  Use lukewarm showers. Increase amlodipine to '10mg'$  daily. Let me know how BP and sugars are running Keep appt with back doctor next week.  Return in 2 months for follow up visit.  Try gabapentin '100mg'$  1-2 times daily as needed.

## 2022-07-26 ENCOUNTER — Other Ambulatory Visit: Payer: Self-pay | Admitting: Family Medicine

## 2022-07-26 NOTE — Assessment & Plan Note (Signed)
He self stopped insulin due to improved sugar control. He is now only on metformin '500mg'$  bid. He regularly checks cbg's BID, showing great control to date. Continue current regimen.  It seems pancreas has awoken and is now producing sufficient insulin without need for exogenous insulin administration.  Continue low sugar low carb diet, continue checking sugars regularly. RTC 2 months f/u visit.  Continue to avoid oral steroids.

## 2022-07-26 NOTE — Assessment & Plan Note (Addendum)
Thought multifactorial (severe R hip OA, pseudogout at knee, likely component of lumbar radiculopathy). Undergoing PT course with benefit.  Tramadol not really helpful with managing pain, avoid NSAIDs in h/o AKI last year - will trial gabapentin '100mg'$  BID.  Pending PM&R evaluation.

## 2022-07-26 NOTE — Assessment & Plan Note (Signed)
H/o herniated discs. Recent MRI overall reassuring.  Pending PMR eval.

## 2022-07-26 NOTE — Assessment & Plan Note (Addendum)
Known h/o severe R hip osteoarthritis by films last year. However this wouldn't fully explain his description of pain to lower leg.

## 2022-07-26 NOTE — Therapy (Signed)
OUTPATIENT PHYSICAL THERAPY TREATMENT NOTE   Patient Name: Gregory Abbott MRN: [id number] DOB:1945/10/26, 77 y.o., male Today's Date: 07/27/2022  PCP: Ria Bush, MD   REFERRING PROVIDER: Ria Bush, MD    END OF SESSION:   PT End of Session - 07/27/22 0952     Visit Number 4    Number of Visits 12    Date for PT Re-Evaluation 09/07/22    Authorization Type MCR    PT Start Time 5277    PT Stop Time 8242    PT Time Calculation (min) 40 min    Activity Tolerance Patient tolerated treatment well    Behavior During Therapy WFL for tasks assessed/performed              Past Medical History:  Diagnosis Date   Blood transfusion without reported diagnosis    BPH (benign prostatic hypertrophy)    Cataract    Childhood asthma    COPD (chronic obstructive pulmonary disease) ([place])    Eczema    Ex-smoker    42 PY hx   GERD (gastroesophageal reflux disease)    History of chicken pox    Hypertension    Lower back pain    h/o herniated and bulging discs lower back   Osteoarthritis of neck    Personal history of colonic polyps 05/07/2013   resected not retrieved   PUD (peptic ulcer disease)    S/P splenectomy    partial   Shortness of breath    Past Surgical History:  Procedure Laterality Date   AAA screen  04/2013   neg for AAA   CATARACT EXTRACTION Left 05/2013    Eye   CATARACT EXTRACTION W/PHACO Right 10/06/2015   Procedure: CATARACT EXTRACTION PHACO AND INTRAOCULAR LENS PLACEMENT (IOC);  Surgeon: Birder Robson, MD;  Location: [facility] ORS;  Service: Ophthalmology;  Laterality: Right;  [place] AP% CDE fluid pack lot [id number] H exp 04/02/2017   COLONOSCOPY  2009   COLONOSCOPY  05/2013   1 polyp, int/ext hemorrhoids Carlean Purl)   COLONOSCOPY  08/2018   Diminutive adenoma, no f/u planned Carlean Purl)   KNEE ARTHROSCOPY     lower extremity arterial doppler  2013   WNL   SPLENECTOMY, PARTIAL  1998   fall down stairs   Patient Active Problem List    Diagnosis Date Noted   Atherosclerosis of aorta ([place]) 05/30/2022   Acute kidney injury ([place]) 05/30/2022   History of partial splenectomy 05/30/2022   Osteoarthritis of right hip 04/13/2022   Calcium pyrophosphate deposition disease (CPPD) 04/13/2022   Diminished pulses in lower extremity 01/14/2022   Right leg pain 10/23/2021   Cerumen impaction 10/20/2020   Abnormal lung sounds 06/26/2019   Allergic rhinitis, seasonal 04/20/2018   Erectile dysfunction of organic origin 06/08/2017   Subclinical hyperthyroidism 05/10/2016   Hyperlipidemia associated with type 2 diabetes mellitus ([place]) 05/01/2016   GERD (gastroesophageal reflux disease) 01/27/2016   Advanced care planning/counseling discussion 04/03/2015   Type 2 diabetes mellitus with other specified complication ([place]) 31/54/0086   COPD (chronic obstructive pulmonary disease) ([place]) 12/10/2013   Skin rash 09/09/2013   Medicare annual wellness visit, subsequent 02/01/2013   Ex-smoker 10/22/2012   Hypertension    Lower back pain     REFERRING DIAG:  M54.41 (ICD-10-CM) - Midline low back pain with right-sided sciatica, unspecified chronicity M79.604 (ICD-10-CM) - Right leg pain   THERAPY DIAG:  Other low back pain  Radiculopathy, lumbar region  Pain in right leg  Rationale for Evaluation and  Treatment Rehabilitation  PERTINENT HISTORY: Osteoarthritis of right hip     Known h/o severe OA by xrays, he saw ortho Dr Tamera Punt early 2023 and underwent PT course with some benefit. Continues HEP. He feels there's more going on than just R hip osteoarthritis.     PRECAUTIONS: None  SUBJECTIVE:                                                                                                                                                                                      SUBJECTIVE STATEMENT: Patient reports that his pain at rest is minimal, but when he is moving it gets to an 8/10.  PAIN:  Are you having pain? Yes: NPRS scale:  1-8/10 Pain location: back and RLE Pain description: ache Aggravating factors: hip flexion, putting on socks Relieving factors: position changes   OBJECTIVE: (objective measures completed at initial evaluation unless otherwise dated)   DIAGNOSTIC FINDINGS:  CLINICAL DATA:  Lumbar radiculopathy.  Right leg pain   EXAM: MRI LUMBAR SPINE WITHOUT CONTRAST   TECHNIQUE: Multiplanar, multisequence MR imaging of the lumbar spine was performed. No intravenous contrast was administered.   COMPARISON:  Lumbar radiographs 06/22/2022   FINDINGS: Segmentation: 5 lumbar vertebra. Lowest disc space L5-S1. Normal segmentation   Alignment:  Mild retrolisthesis L1-2.  Mild anterolisthesis L4-5   Vertebrae: Schmorl's nodes at L1-2 and L3-4. Negative for fracture or mass   Conus medullaris and cauda equina: Conus extends to the L2-3 level. Conus and cauda equina appear normal.   Paraspinal and other soft tissues: Synovial cyst posterior to the facet joints on the right at L2-3. No neural impingement. No soft tissue mass or adenopathy identified.   Disc levels:   L1-2: Mild disc and mild facet degeneration. Mild subarticular stenosis bilaterally   L2-3: Disc bulging and facet degeneration. Mild subarticular stenosis bilaterally.   L3-4: Mild disc bulging and bilateral facet degeneration. Subarticular stenosis left greater than right. Possible left L4 nerve root impingement. Central canal patent   L4-5: Mild anterolisthesis with advanced facet degeneration. Diffuse disc bulging and endplate spurring. Moderate central canal stenosis and moderate subarticular and foraminal stenosis bilaterally   L5-S1: Moderate disc degeneration with disc space narrowing and mild spurring. Mild facet degeneration. Mild subarticular stenosis bilaterally.   IMPRESSION: 1. Multilevel degenerative change throughout the lumbar spine. 2. Subarticular stenosis left greater than right L3-4. 3. Moderate  central canal stenosis L4-5 with moderate subarticular and foraminal stenosis bilaterally. 4. Mild subarticular stenosis bilaterally L5-S1.     Electronically Signed   By: Franchot Gallo M.D.   On: 07/07/2022 16:42     PATIENT SURVEYS:  ODI 7/50 (14% disability)  SCREENING FOR RED FLAGS: negative   COGNITION: Overall cognitive status: Within functional limits for tasks assessed                          SENSATION: Not tested   MUSCLE LENGTH: Hamstrings: Right 70 deg; Left 70 deg Thomas test: neg R   POSTURE: rounded shoulders, forward head, and decreased lumbar lordosis   PALPATION: deferred   LUMBAR ROM:    AROM eval  Flexion 75%  Extension 25%  Right lateral flexion 25%  Left lateral flexion 25%  Right rotation 75%  Left rotation 75%   (Blank rows = not tested)   LOWER EXTREMITY ROM:      Active  Right eval Left eval  Hip flexion 120d    Hip extension 10d    Hip abduction      Hip adduction      Hip internal rotation      Hip external rotation      Knee flexion      Knee extension      Ankle dorsiflexion      Ankle plantarflexion      Ankle inversion      Ankle eversion       (Blank rows = not tested)   LOWER EXTREMITY MMT:     MMT Right eval Left eval  Hip flexion 4    Hip extension 4    Hip abduction 4    Hip adduction      Hip internal rotation      Hip external rotation      Knee flexion 4    Knee extension 4    Ankle dorsiflexion      Ankle plantarflexion      Ankle inversion      Ankle eversion      Trunk/core 3 3   (Blank rows = not tested)   LUMBAR SPECIAL TESTS:  Straight leg raise test: Negative, Slump test: Positive, FABER test: restricted and painful in hip, and Thomas test: Negative   FUNCTIONAL TESTS:  30s chair stand 1 rep   GAIT: Distance walked: 52fx2 Assistive device utilized: Single point cane Level of assistance: Complete Independence Comments: antalgic gait   TODAY'S TREATMENT:        [facility] Adult PT  Treatment:                                                DATE: 07/27/22 Therapeutic Exercise: Nustep level 6 x 5 mins Side stepping at counter RTB at ankles x4 laps Standing hip extension RTB at ankles 3x10 BIL Omega knee extension 5# 2x10 Omega knee flexion 25# 2x10 Seated hamstring stretch 2x30" BIL PPT 5" hold 2x10 SLR x10 BIL (small ROM and pain on Rt) Supine sciatic nerve glides x20 Rt Supine figure 4 piriformis stretch push pull 2x30" each BIL Modified thomas stretch EOM x1' BIL Bridges 2x10 LTR x10 BIL  [facility] Adult PT Treatment:                                                DATE: 07/21/2022 Therapeutic Exercise: Nustep level 6 x 5 mins Side stepping at counter RTB at ankles x4 laps Standing  hip extension RTB at ankles 2x10 BIL Omega knee extension 5# 2x10 Omega knee flexion 25# 2x10 Seated hamstring stretch 2x30" BIL PPT 5" hold 2x10 SLR x10 BIL (small ROM and pain) Supine sciatic nerve glides x20 Rt Supine figure 4 piriformis stretch push pull 2x30" each BIL Modified thomas stretch EOM x1' BIL Bridges 2x10 LTR x10 BIL                                 [facility] Adult PT Treatment:                                                DATE: 07/19/2022 Therapeutic Exercise: Nustep level 5 x 5 mins Seated hamstring stretch 2x30" BIL PPT 5" hold 2x10 SLR x10 BIL (pain and small ROM on Rt) Supine sciatic nerve glides x20 Rt Supine figure 4 piriformis stretch push pull 2x30" each Rt Bridges 2x10 Sidelying hip abduction 2x10 BIL LTR x10 BIL Open books x10 BIL STS hands on knees, heavy lean to L    PATIENT EDUCATION:  Education details: Discussed eval findings, rehab rationale and POC and patient is in agreement  Person educated: Patient Education method: Explanation Education comprehension: verbalized understanding and needs further education   HOME EXERCISE PROGRAM: Access Code: [email or web address] URL: [email or web address] Date: 07/13/2022 Prepared by: Sharlynn Oliphant    Exercises - Supine Bridge  - 2 x daily - 5 x weekly - 1 sets - 15 reps - Clamshell  - 2 x daily - 5 x weekly - 1 sets - 15 reps - Supine Figure 4 Piriformis Stretch  - 2 x daily - 5 x weekly - 1 sets - 2 reps - 30s hold   ASSESSMENT:   CLINICAL IMPRESSION: Patient presents to PT with continued pain in his RLE and reports that he gets an injection next Monday. Session today continued to focus on proximal hip and LE strengthening as well as stretching. Patient was able to tolerate all prescribed exercises with no adverse effects. Patient continues to benefit from skilled PT services and should be progressed as able to improve functional independence.     OBJECTIVE IMPAIRMENTS: .Abnormal gait, decreased activity tolerance, decreased knowledge of condition, decreased knowledge of use of DME, decreased mobility, difficulty walking, decreased strength, increased fascial restrictions, increased muscle spasms, impaired flexibility, and pain    ACTIVITY LIMITATIONS: carrying, lifting, bending, standing, stairs, and bed mobility   PERSONAL FACTORS: Age, Fitness, Past/current experiences, Time since onset of injury/illness/exacerbation, and 1-2 comorbidities: HNP and R hip OA  are also affecting patient's functional outcome.    REHAB POTENTIAL: Fair based on chronicity and unsuccessful conservative treatment to date   CLINICAL DECISION MAKING: Evolving/moderate complexity   EVALUATION COMPLEXITY: Low     GOALS: Goals reviewed with patient? No   SHORT TERM GOALS: Target date: 08/03/2022   Patient to demonstrate independence in HEP  Baseline: [id number] Goal status: INITIAL   2.  Increase deficit lumbar AROM to 50% Baseline:  AROM eval  Flexion 75%  Extension 25%  Right lateral flexion 25%  Left lateral flexion 25%  Right rotation 75%  Left rotation 75%    Goal status: INITIAL       LONG TERM GOALS: Target date: 08/24/2022   Increase trunk and core strength to 3+/5 Baseline: 3/5  trunk  strength Goal status: INITIAL   2.  Decrease worst pain to 6/10 Baseline: 8/10 Goal status: INITIAL   3.  Increase lumbar SB to 75% B Baseline: 25% B Goal status: INITIAL   4.  Increase 30s chair stand score to 5 arms crossed Baseline: 1 rep arms crossed Goal status: INITIAL       PLAN:   PT FREQUENCY: 2x/week   PT DURATION: 6 weeks   PLANNED INTERVENTIONS: Therapeutic exercises, Therapeutic activity, Neuromuscular re-education, Balance training, Gait training, Patient/Family education, Self Care, Joint mobilization, Stair training, Aquatic Therapy, Manual therapy, and Re-evaluation.   PLAN FOR NEXT SESSION: HEP review, R hip stretch and strength, piriformis stretch, posture training and sciatic glides.   Margarette Canada, PTA 07/27/2022, 10:36 AM

## 2022-07-26 NOTE — Telephone Encounter (Signed)
Kenalog cream Last filled: 05/08/22, #60 g Last OV: 07/25/22, 1 mo DM f/u Next OV: 09/21/22, 2 mo DM f/u

## 2022-07-26 NOTE — Assessment & Plan Note (Signed)
Generalized pruritus has improved off HCTZ - will add to allergy list.  Rash today suspicious for component of eczema - continue TCI cream, discussed eczema care ie avoiding hot showers, regular moisturizing.  Offered derm eval, he declines for now, will let me know if changes his mind.

## 2022-07-27 ENCOUNTER — Ambulatory Visit: Payer: Medicare Other

## 2022-07-27 DIAGNOSIS — M5459 Other low back pain: Secondary | ICD-10-CM

## 2022-07-27 DIAGNOSIS — M5416 Radiculopathy, lumbar region: Secondary | ICD-10-CM | POA: Diagnosis not present

## 2022-07-27 DIAGNOSIS — M79604 Pain in right leg: Secondary | ICD-10-CM

## 2022-07-29 ENCOUNTER — Ambulatory Visit: Payer: Medicare Other

## 2022-07-29 DIAGNOSIS — M79604 Pain in right leg: Secondary | ICD-10-CM | POA: Diagnosis not present

## 2022-07-29 DIAGNOSIS — M5459 Other low back pain: Secondary | ICD-10-CM | POA: Diagnosis not present

## 2022-07-29 DIAGNOSIS — M5416 Radiculopathy, lumbar region: Secondary | ICD-10-CM

## 2022-07-29 LAB — FRUCTOSAMINE: Fructosamine: 275 umol/L (ref 205–285)

## 2022-07-29 NOTE — Therapy (Addendum)
OUTPATIENT PHYSICAL THERAPY TREATMENT NOTE   Patient Name: Gregory Abbott MRN: [id number] DOB:1945-12-13, 77 y.o., male Today's Date: 07/29/2022  PCP: Ria Bush, MD   REFERRING PROVIDER: Ria Bush, MD    END OF SESSION:   PT End of Session - 07/29/22 0947     Visit Number 5    Number of Visits 12    Date for PT Re-Evaluation 09/07/22    Authorization Type MCR    PT Start Time 1000    PT Stop Time 7414    PT Time Calculation (min) 40 min    Activity Tolerance Patient tolerated treatment well    Behavior During Therapy WFL for tasks assessed/performed               Past Medical History:  Diagnosis Date   Blood transfusion without reported diagnosis    BPH (benign prostatic hypertrophy)    Cataract    Childhood asthma    COPD (chronic obstructive pulmonary disease) ([place])    Eczema    Ex-smoker    35 PY hx   GERD (gastroesophageal reflux disease)    History of chicken pox    Hypertension    Lower back pain    h/o herniated and bulging discs lower back   Osteoarthritis of neck    Personal history of colonic polyps 05/07/2013   resected not retrieved   PUD (peptic ulcer disease)    S/P splenectomy    partial   Shortness of breath    Past Surgical History:  Procedure Laterality Date   AAA screen  04/2013   neg for AAA   CATARACT EXTRACTION Left 05/2013   [facility]   CATARACT EXTRACTION W/PHACO Right 10/06/2015   Procedure: CATARACT EXTRACTION PHACO AND INTRAOCULAR LENS PLACEMENT (IOC);  Surgeon: Birder Robson, MD;  Location: [facility] ORS;  Service: Ophthalmology;  Laterality: Right;  [place] AP% CDE fluid pack lot [id number] H exp 04/02/2017   COLONOSCOPY  2009   COLONOSCOPY  05/2013   1 polyp, int/ext hemorrhoids Carlean Purl)   COLONOSCOPY  08/2018   Diminutive adenoma, no f/u planned Carlean Purl)   KNEE ARTHROSCOPY     lower extremity arterial doppler  2013   WNL   SPLENECTOMY, PARTIAL  1998   fall down stairs   Patient Active Problem List    Diagnosis Date Noted   Atherosclerosis of aorta ([place]) 05/30/2022   Acute kidney injury ([place]) 05/30/2022   History of partial splenectomy 05/30/2022   Osteoarthritis of right hip 04/13/2022   Calcium pyrophosphate deposition disease (CPPD) 04/13/2022   Diminished pulses in lower extremity 01/14/2022   Right leg pain 10/23/2021   Cerumen impaction 10/20/2020   Abnormal lung sounds 06/26/2019   Allergic rhinitis, seasonal 04/20/2018   Erectile dysfunction of organic origin 06/08/2017   Subclinical hyperthyroidism 05/10/2016   Hyperlipidemia associated with type 2 diabetes mellitus ([place]) 05/01/2016   GERD (gastroesophageal reflux disease) 01/27/2016   Advanced care planning/counseling discussion 04/03/2015   Type 2 diabetes mellitus with other specified complication ([place]) 23/34/3568   COPD (chronic obstructive pulmonary disease) ([place]) 12/10/2013   Skin rash 09/09/2013   Medicare annual wellness visit, subsequent 02/01/2013   Ex-smoker 10/22/2012   Hypertension    Lower back pain     REFERRING DIAG:  M54.41 (ICD-10-CM) - Midline low back pain with right-sided sciatica, unspecified chronicity M79.604 (ICD-10-CM) - Right leg pain   THERAPY DIAG:  Other low back pain  Pain in right leg  Radiculopathy, lumbar region  Rationale for Evaluation  and Treatment Rehabilitation  PERTINENT HISTORY: Osteoarthritis of right hip     Known h/o severe OA by [place], he saw ortho Dr Tamera Punt early 2023 and underwent PT course with some benefit. Continues HEP. He feels there's more going on than just R hip osteoarthritis.     PRECAUTIONS: None  SUBJECTIVE:                                                                                                                                                                                      SUBJECTIVE STATEMENT: Continued symptoms 8-9/10 with activity, minimal at rest and sitting  PAIN:  Are you having pain? Yes: NPRS scale: 1-8/10 Pain location: back  and RLE Pain description: ache Aggravating factors: hip flexion, putting on socks Relieving factors: position changes   OBJECTIVE: (objective measures completed at initial evaluation unless otherwise dated)   DIAGNOSTIC FINDINGS:  CLINICAL DATA:  Lumbar radiculopathy.  Right leg pain   EXAM: MRI LUMBAR SPINE WITHOUT CONTRAST   TECHNIQUE: Multiplanar, multisequence MR imaging of the lumbar spine was performed. No intravenous contrast was administered.   COMPARISON:  Lumbar radiographs 06/22/2022   FINDINGS: Segmentation: 5 lumbar vertebra. Lowest disc space L5-S1. Normal segmentation   Alignment:  Mild retrolisthesis L1-2.  Mild anterolisthesis L4-5   Vertebrae: Schmorl's nodes at L1-2 and L3-4. Negative for fracture or mass   Conus medullaris and cauda equina: Conus extends to the L2-3 level. Conus and cauda equina appear normal.   Paraspinal and other soft tissues: Synovial cyst posterior to the facet joints on the right at L2-3. No neural impingement. No soft tissue mass or adenopathy identified.   Disc levels:   L1-2: Mild disc and mild facet degeneration. Mild subarticular stenosis bilaterally   L2-3: Disc bulging and facet degeneration. Mild subarticular stenosis bilaterally.   L3-4: Mild disc bulging and bilateral facet degeneration. Subarticular stenosis left greater than right. Possible left L4 nerve root impingement. Central canal patent   L4-5: Mild anterolisthesis with advanced facet degeneration. Diffuse disc bulging and endplate spurring. Moderate central canal stenosis and moderate subarticular and foraminal stenosis bilaterally   L5-S1: Moderate disc degeneration with disc space narrowing and mild spurring. Mild facet degeneration. Mild subarticular stenosis bilaterally.   IMPRESSION: 1. Multilevel degenerative change throughout the lumbar spine. 2. Subarticular stenosis left greater than right L3-4. 3. Moderate central canal stenosis L4-5  with moderate subarticular and foraminal stenosis bilaterally. 4. Mild subarticular stenosis bilaterally L5-S1.     Electronically Signed   By: Franchot Gallo M.D.   On: 07/07/2022 16:42     PATIENT SURVEYS:  ODI 7/50 (14% disability)   SCREENING FOR RED FLAGS: negative  COGNITION: Overall cognitive status: Within functional limits for tasks assessed                          SENSATION: Not tested   MUSCLE LENGTH: Hamstrings: Right 70 deg; Left 70 deg Thomas test: neg R   POSTURE: rounded shoulders, forward head, and decreased lumbar lordosis   PALPATION: deferred   LUMBAR ROM:    AROM eval 07/29/22  Flexion 75%   Extension 25%   Right lateral flexion 25%   Left lateral flexion 25%   Right rotation 75%   Left rotation 75%    (Blank rows = not tested)   LOWER EXTREMITY ROM:      Active  Right eval Left eval  Hip flexion 120d    Hip extension 10d    Hip abduction      Hip adduction      Hip internal rotation      Hip external rotation      Knee flexion      Knee extension      Ankle dorsiflexion      Ankle plantarflexion      Ankle inversion      Ankle eversion       (Blank rows = not tested)   LOWER EXTREMITY MMT:     MMT Right eval Left eval  Hip flexion 4    Hip extension 4    Hip abduction 4    Hip adduction      Hip internal rotation      Hip external rotation      Knee flexion 4    Knee extension 4    Ankle dorsiflexion      Ankle plantarflexion      Ankle inversion      Ankle eversion      Trunk/core 3 3   (Blank rows = not tested)   LUMBAR SPECIAL TESTS:  Straight leg raise test: Negative, Slump test: Positive, FABER test: restricted and painful in hip, and Thomas test: Negative   FUNCTIONAL TESTS:  30s chair stand 1 rep   GAIT: Distance walked: 64fx2 Assistive device utilized: Single point cane Level of assistance: Complete Independence Comments: antalgic gait   TODAY'S TREATMENT:    [facility] Adult PT Treatment:                                                 DATE: 07/29/22 Therapeutic Exercise: Nustep L4 8 min Seated hamstring stretch 30s x2  Supine QL stretch 30s x2 Bil R hip flexor stretch 30s x2 (bolster) Supine hip fallouts R GTB 15x2 (bolster against LLE) Bridge with ball 15x2 POE 2 min hold Manual Therapy: R piriformis release 8 min    [facility] Adult PT Treatment:                                                DATE: 07/27/22 Therapeutic Exercise: Nustep level 6 x 5 mins Side stepping at counter RTB at ankles x4 laps Standing hip extension RTB at ankles 3x10 BIL Omega knee extension 5# 2x10 Omega knee flexion 25# 2x10 Seated hamstring stretch 2x30" BIL PPT 5" hold 2x10 SLR x10 BIL (small ROM and pain  on Rt) Supine sciatic nerve glides x20 Rt Supine figure 4 piriformis stretch push pull 2x30" each BIL Modified thomas stretch EOM x1' BIL Bridges 2x10 LTR x10 BIL  [facility] Adult PT Treatment:                                                DATE: 07/21/2022 Therapeutic Exercise: Nustep level 6 x 5 mins Side stepping at counter RTB at ankles x4 laps Standing hip extension RTB at ankles 2x10 BIL Omega knee extension 5# 2x10 Omega knee flexion 25# 2x10 Seated hamstring stretch 2x30" BIL PPT 5" hold 2x10 SLR x10 BIL (small ROM and pain) Supine sciatic nerve glides x20 Rt Supine figure 4 piriformis stretch push pull 2x30" each BIL Modified thomas stretch EOM x1' BIL Bridges 2x10 LTR x10 BIL                                 [facility] Adult PT Treatment:                                                DATE: 07/19/2022 Therapeutic Exercise: Nustep level 5 x 5 mins Seated hamstring stretch 2x30" BIL PPT 5" hold 2x10 SLR x10 BIL (pain and small ROM on Rt) Supine sciatic nerve glides x20 Rt Supine figure 4 piriformis stretch push pull 2x30" each Rt Bridges 2x10 Sidelying hip abduction 2x10 BIL LTR x10 BIL Open books x10 BIL STS hands on knees, heavy lean to L    PATIENT EDUCATION:  Education details:  Discussed eval findings, rehab rationale and POC and patient is in agreement  Person educated: Patient Education method: Explanation Education comprehension: verbalized understanding and needs further education   HOME EXERCISE PROGRAM: Access Code: [email or web address] URL: [email or web address] Date: 07/29/2022 Prepared by: Sharlynn Oliphant  Exercises - Supine Bridge  - 2 x daily - 5 x weekly - 1 sets - 15 reps - Clamshell  - 2 x daily - 5 x weekly - 1 sets - 15 reps - Supine Figure 4 Piriformis Stretch  - 2 x daily - 5 x weekly - 1 sets - 2 reps - 30s hold - Supine Quadratus Lumborum Stretch  - 2 x daily - 5 x weekly - 1 sets - 2 reps - 30s hold   ASSESSMENT:   CLINICAL IMPRESSION: Unchanging pain levels.  Treatment focus shifted to stretching tasks for lumbar spine and R hip including prone lie and supine hip flexor stretching.  Added manual piriformis release which helped lessen symptoms.  Added isolated R hip strengthening/stabilization tasks.  Able to ambulate w/o cane wih more upright posture at end of session.    OBJECTIVE IMPAIRMENTS: .Abnormal gait, decreased activity tolerance, decreased knowledge of condition, decreased knowledge of use of DME, decreased mobility, difficulty walking, decreased strength, increased fascial restrictions, increased muscle spasms, impaired flexibility, and pain    ACTIVITY LIMITATIONS: carrying, lifting, bending, standing, stairs, and bed mobility   PERSONAL FACTORS: Age, Fitness, Past/current experiences, Time since onset of injury/illness/exacerbation, and 1-2 comorbidities: HNP and R hip OA  are also affecting patient's functional outcome.    REHAB POTENTIAL: Fair based on chronicity and unsuccessful conservative treatment to date  CLINICAL DECISION MAKING: Evolving/moderate complexity   EVALUATION COMPLEXITY: Low     GOALS: Goals reviewed with patient? No   SHORT TERM GOALS: Target date: 08/03/2022   Patient to demonstrate  independence in HEP  Baseline: [id number] Goal status: INITIAL   2.  Increase deficit lumbar AROM to 50% Baseline:  AROM eval  Flexion 75%  Extension 25%  Right lateral flexion 25%  Left lateral flexion 25%  Right rotation 75%  Left rotation 75%    Goal status: INITIAL       LONG TERM GOALS: Target date: 08/24/2022   Increase trunk and core strength to 3+/5 Baseline: 3/5 trunk strength Goal status: INITIAL   2.  Decrease worst pain to 6/10 Baseline: 8/10 Goal status: INITIAL   3.  Increase lumbar SB to 75% B Baseline: 25% B Goal status: INITIAL   4.  Increase 30s chair stand score to 5 arms crossed Baseline: 1 rep arms crossed Goal status: INITIAL       PLAN:   PT FREQUENCY: 2x/week   PT DURATION: 6 weeks   PLANNED INTERVENTIONS: Therapeutic exercises, Therapeutic activity, Neuromuscular re-education, Balance training, Gait training, Patient/Family education, Self Care, Joint mobilization, Stair training, Aquatic Therapy, Manual therapy, and Re-evaluation.   PLAN FOR NEXT SESSION: HEP review, R hip stretch and strength, piriformis stretch, posture training and sciatic glides.   Lanice Shirts, PT 07/29/2022, 10:45 AM

## 2022-08-01 ENCOUNTER — Encounter
Payer: Medicare Other | Attending: Physical Medicine and Rehabilitation | Admitting: Physical Medicine and Rehabilitation

## 2022-08-01 ENCOUNTER — Encounter: Payer: Self-pay | Admitting: Physical Medicine and Rehabilitation

## 2022-08-01 VITALS — BP 134/67 | HR 69 | Ht 69.0 in | Wt 181.0 lb

## 2022-08-01 DIAGNOSIS — G894 Chronic pain syndrome: Secondary | ICD-10-CM | POA: Diagnosis not present

## 2022-08-01 DIAGNOSIS — M5416 Radiculopathy, lumbar region: Secondary | ICD-10-CM | POA: Insufficient documentation

## 2022-08-01 DIAGNOSIS — M1611 Unilateral primary osteoarthritis, right hip: Secondary | ICD-10-CM | POA: Diagnosis not present

## 2022-08-01 MED ORDER — GABAPENTIN 300 MG PO CAPS: 300.0000 mg | ORAL_CAPSULE | Freq: Two times a day (BID) | ORAL | 5 refills | Status: DC

## 2022-08-01 NOTE — Progress Notes (Unsigned)
Subjective:    Patient ID: Gregory Abbott, male    DOB: 1946/02/14, 77 y.o.   MRN: [id number]  HPI   Pain Inventory Average Pain 8 Pain Right Now 2 My pain is constant, sharp, stabbing, and throb  In the last 24 hours, has pain interfered with the following? General activity 3 Relation with others 0 Enjoyment of life 2 What TIME of day is your pain at its worst? varies Sleep (in general) Good  Pain is worse with: walking Pain improves with:  nothing Relief from Meds: 1  use a cane how many minutes can you walk? 10  ability to climb steps?  yes do you drive?  yes  retired  No problems in this area  CT/MRI XRAY in the [place]  Any changes since last visit?  no    Family History  Problem Relation Age of Onset  . Diabetes Mother   . Hypertension Mother   . Hyperlipidemia Mother   . Heart disease Mother   . Stroke Father   . Colon cancer Neg Hx   . Rectal cancer Neg Hx   . Stomach cancer Neg Hx   . Esophageal cancer Neg Hx    Social History   Socioeconomic History  . Marital status: Married    Spouse name: Not on file  . Number of children: 2  . Years of education: Not on file  . Highest education level: Not on file  Occupational History  . Occupation: Retired  Tobacco Use  . Smoking status: Former    Packs/day: 0.50    Years: 49.00    Total pack years: 24.50    Types: Cigarettes    Quit date: 11/06/2013    Years since quitting: 8.7  . Smokeless tobacco: Never  Vaping Use  . Vaping Use: Never used  Substance and Sexual Activity  . Alcohol use: Not Currently    Alcohol/week: 2.0 standard drinks of alcohol    Types: 1 Cans of beer, 1 Shots of liquor per week    Comment: occasional  . Drug use: No  . Sexual activity: Yes  Other Topics Concern  . Not on file  Social History Narrative   Lives with wife, no pets   Occupation: retired, Engineer, mining   Edu: Degree   Activity: housework, no regular exercise   Diet: good water,  fruits/vegetables daily   Social Determinants of Health   Financial Resource Strain: Low Risk  (10/18/2021)   Overall Financial Resource Strain (CARDIA)   . Difficulty of Paying Living Expenses: Not hard at all  Food Insecurity: No Food Insecurity (10/18/2021)   Hunger Vital Sign   . Worried About Charity fundraiser in the Last Year: Never true   . Ran Out of Food in the Last Year: Never true  Transportation Needs: No Transportation Needs (10/18/2021)   PRAPARE - Transportation   . Lack of Transportation (Medical): No   . Lack of Transportation (Non-Medical): No  Physical Activity: Sufficiently Active (10/18/2021)   Exercise Vital Sign   . Days of Exercise per Week: 5 days   . Minutes of Exercise per Session: 30 min  Stress: No Stress Concern Present (10/18/2021)   [place]   . Feeling of Stress : Not at all  Social Connections: Socially Integrated (10/18/2021)   Social Connection and Isolation Panel [NHANES]   . Frequency of Communication with Friends and Family: More than three times a week   .  Frequency of Social Gatherings with Friends and Family: More than three times a week   . Attends Religious Services: More than 4 times per year   . Active Member of Clubs or Organizations: Yes   . Attends Archivist Meetings: More than 4 times per year   . Marital Status: Married   Past Surgical History:  Procedure Laterality Date  . AAA screen  04/2013   neg for AAA  . CATARACT EXTRACTION Left 05/2013   [facility]  . CATARACT EXTRACTION W/PHACO Right 10/06/2015   Procedure: CATARACT EXTRACTION PHACO AND INTRAOCULAR LENS PLACEMENT (IOC);  Surgeon: Birder Robson, MD;  Location: [facility] ORS;  Service: Ophthalmology;  Laterality: Right;  [place] AP% CDE fluid pack lot 3 [id number] H exp 04/02/2017  . COLONOSCOPY  2009  . COLONOSCOPY  05/2013   1 polyp, int/ext hemorrhoids Carlean Purl)  . COLONOSCOPY  08/2018   Diminutive  adenoma, no f/u planned Carlean Purl)  . KNEE ARTHROSCOPY    . lower extremity arterial doppler  2013   WNL  . SPLENECTOMY, PARTIAL  1998   fall down stairs   Past Medical History:  Diagnosis Date  . Blood transfusion without reported diagnosis   . BPH (benign prostatic hypertrophy)   . Cataract   . Childhood asthma   . COPD (chronic obstructive pulmonary disease) ([place])   . Eczema   . Ex-smoker    40 PY hx  . GERD (gastroesophageal reflux disease)   . History of chicken pox   . Hypertension   . Lower back pain    h/o herniated and bulging discs lower back  . Osteoarthritis of neck   . Personal history of colonic polyps 05/07/2013   resected not retrieved  . PUD (peptic ulcer disease)   . S/P splenectomy    partial  . Shortness of breath    BP 134/67   Pulse 69   Ht '5\' 9"'$  (1.753 m)   Wt 181 lb (82.1 kg)   SpO2 96%   BMI 26.73 kg/m   Opioid Risk Score:   Fall Risk Score:  `1  Depression screen PHQ 2/9     08/01/2022    1:18 PM 07/25/2022    9:59 AM 10/18/2021   11:22 AM 10/13/2020   10:33 AM 06/20/2019    2:47 PM 06/15/2018    8:20 AM 06/08/2017   12:52 PM  Depression screen PHQ 2/9  Decreased Interest 0 0 0 0 0 0 0  Down, Depressed, Hopeless 0 0 0 0 0 0 0  PHQ - 2 Score 0 0 0 0 0 0 0  Altered sleeping 0   0 0 0   Tired, decreased energy 0   0 0 0   Change in appetite 0   0 0 0   Feeling bad or failure about yourself  0   0 0 0   Trouble concentrating 0   0 0 0   Moving slowly or fidgety/restless 0   0 0 0   Suicidal thoughts 0   0 0 0   PHQ-9 Score 0   0 0 0   Difficult doing work/chores    Not difficult at all Not difficult at all Not difficult at all     Review of Systems  Musculoskeletal:  Positive for gait problem.       Right side pain from hip down to  right leg  All other systems reviewed and are negative.      Objective:   Physical  Exam        Assessment & Plan:

## 2022-08-01 NOTE — Assessment & Plan Note (Signed)
Increase gabapentin to 300 mg twice daily. Call clinic if you have any side effects from this and we can reduce the dose.  If you need additional pain control, you can use Tylenol up to 1000 mg per dose up to 3 times daily.   I am referring you to Dr. Letta Pate for epidural steroid injections. Once this is completed, you will follow up with me after 6-8 weeks.

## 2022-08-01 NOTE — Patient Instructions (Signed)
   Chronic right-sided lumbar radiculopathy Assessment & Plan: Increase gabapentin to 300 mg twice daily. Call clinic if you have any side effects from this and we can reduce the dose.  If you need additional pain control, you can use Tylenol up to 1000 mg per dose up to 3 times daily.   I am referring you to Dr. Letta Pate for epidural steroid injections. Once this is completed, you will follow up with me after 6-8 weeks.    Primary osteoarthritis of right hip Assessment & Plan: Continue PT  If the epidural steroid injection does not help, we will consider injections into the hip in the future.

## 2022-08-01 NOTE — Assessment & Plan Note (Signed)
Continue PT  If the epidural steroid injection does not help, we will consider injections into the hip in the future.

## 2022-08-01 NOTE — Progress Notes (Deleted)
HPI  Gregory Abbott is a 77 y.o. year old male  who  has a past medical history of Blood transfusion without reported diagnosis, BPH (benign prostatic hypertrophy), Cataract, Childhood asthma, COPD (chronic obstructive pulmonary disease) (HCC), Eczema, Ex-smoker, GERD (gastroesophageal reflux disease), History of chicken pox, Hypertension, Lower back pain, Osteoarthritis of neck, Personal history of colonic polyps (05/07/2013), PUD (peptic ulcer disease), S/P splenectomy, and Shortness of breath.   They are presenting to PM&R clinic as a new patient for pain management evaluation. They were referred by Dr. Sharen Hones for treatment of low back pain w/ R sciatica.   MRI lumbar spine 07/07/22:  IMPRESSION:  1. Multilevel degenerative change throughout the lumbar spine.  2. Subarticular stenosis left greater than right L3-4.  3. Moderate central canal stenosis L4-5 with moderate subarticular  and foraminal stenosis bilaterally.  4. Mild subarticular stenosis bilaterally L5-S1.   Source: R leg; states low back really does not bother him  Inciting incident: none. Was Dx with herniated discs several years ago and used to get back spasms when he bent the wrong way; it would go away on its own. Around Spring 2023, he started having pain in R leg that effected his gait.  Description of pain: constant, dull, and aching around right lateral thigh, extending into his feet Severity: On average 8 /10. At worst 10 /10. At best 2 /10. Exacerbating factors: Moving his legs in bed at nighttime, after he starts walking will worsen quickly Remitting factors: massage and sitting still for long periods Red flag symptoms: No red flags for back pain endorsed in Hx or ROS  Medications tried: Topical medications ( not effective ) : Aspercreme; wants to try voltaren Nsaids (never tried) : heard it's bad for his kidney Tylenol  ( no effect ) : has taken up to 1500 mg at a time.  Opiates  (no effect) : Tramadol 50 mg daily  prescribed 06/22/22. Stopped.  Gabapentin / Lyrica  (moderate effect) : Gabapentin 100 mg tabs 300 mg qhs   TCAs  (never tried) :  SNRIs  (never tried) :  Other  (unsure of effect) : Has been treated for pseudogout without benefit; prednisone cause hyperglycemic emergency  Other treatments: PT/OT  (moderate effect) : Massage with PT has been helpful on his right hip Accupuncture/chiropractor/massage  (never tried) :  TENs unit (never tried) :  Injections (never tried) :  Surgery (never tried) :  Other none  Goals for pain control: Used to walk  Prior UDS results: No results found for: "LABOPIA", "COCAINSCRNUR", "LABBENZ", "AMPHETMU", "THCU", "LABBARB"   ROS: ROS  PE: Constitution: Appropriate appearance for age. No apparent distress  HEENT: PERRL, EOMI grossly intact.  Resp: CTAB. No rales, rhonchi, or wheezing. Cardio: RRR. No mumurs, rubs, or gallops. No peripheral edema. Abdomen: Nondistended. Nontender. +bowel sounds. Psych: Appropriate mood and affect. Neuro: AAOx4. No apparent deficits   Neurologic Exam:   DTRs: Reflexes were 2+ in bilateral achilles, patella, biceps, BR and triceps. Babinsky: flexor responses b/l.   Hoffmans: negative b/l Sensory exam: revealed normal sensation in all dermatomal regions in bilateral upper extremities and bilateral lower extremities Motor exam: strength 5/5 throughout bilateral upper extremities and with exception of right hip flexors 4/5, r knee extensors 4/5  Coordination: Fine motor coordination was normal.   Gait: +trendelenberg/R hip weakness;Uses cane in L hand  Back Exam:   Inspection: Pelvis was even .  Lumbar lordotic curvature was WNL .  There was no evidence of scoliosis.  Palpation: No TTP along back, mild + TTP R quads, hanstrings, and TFL. There was no evidence of spasm. No trigger points were noted.     Special/provocative testing:    SLR: + on R with both L and R leg raise   Slump test: + On R    Facet loading: +  radiating pains down R thigh (very non-specific)   TTP at paraspinals: Negative bilaterally (sensitive for facet pain...if no ttp then likely not facet pain)   Assessment and Plan: Gregory Abbott is a 77 y.o. year old male  who  has a past medical history of Blood transfusion without reported diagnosis, BPH (benign prostatic hypertrophy), Cataract, Childhood asthma, COPD (chronic obstructive pulmonary disease) (HCC), Eczema, Ex-smoker, GERD (gastroesophageal reflux disease), History of chicken pox, Hypertension, Lower back pain, Osteoarthritis of neck, Personal history of colonic polyps (05/07/2013), PUD (peptic ulcer disease), S/P splenectomy, and Shortness of breath.   They are presenting to PM&R clinic as a new patient for treatment of right leg pain.   Chronic right-sided lumbar radiculopathy Assessment & Plan: Increase gabapentin to 300 mg twice daily. Call clinic if you have any side effects from this and we can reduce the dose.  If you need additional pain control, you can use Tylenol up to 1000 mg per dose up to 3 times daily.   I am referring you to Dr. Wynn Banker for epidural steroid injections. Once this is completed, you will follow up with me after 6-8 weeks.    Primary osteoarthritis of right hip Assessment & Plan: Continue PT  If the epidural steroid injection does not help, we will consider injections into the hip in the future.    Chronic pain syndrome  Other orders -     Gabapentin; Take 1 capsule (300 mg total) by mouth 2 (two) times daily.  Dispense: 60 capsule; Refill: 5      Angelina Sheriff, DO 08/01/2022

## 2022-08-03 ENCOUNTER — Ambulatory Visit: Payer: Medicare Other

## 2022-08-03 DIAGNOSIS — M79604 Pain in right leg: Secondary | ICD-10-CM | POA: Diagnosis not present

## 2022-08-03 DIAGNOSIS — M5416 Radiculopathy, lumbar region: Secondary | ICD-10-CM

## 2022-08-03 DIAGNOSIS — M5459 Other low back pain: Secondary | ICD-10-CM | POA: Diagnosis not present

## 2022-08-03 NOTE — Therapy (Signed)
OUTPATIENT PHYSICAL THERAPY TREATMENT NOTE   Patient Name: Gregory Abbott MRN: [id number] DOB:Sep 12, 1945, 77 y.o., male Today's Date: 08/03/2022  PCP: Ria Bush, MD   REFERRING PROVIDER: Ria Bush, MD    END OF SESSION:   PT End of Session - 08/03/22 0951     Visit Number 6    Number of Visits 12    Date for PT Re-Evaluation 09/07/22    Authorization Type MCR    PT Start Time 1000    PT Stop Time 3762    PT Time Calculation (min) 40 min    Activity Tolerance Patient tolerated treatment well    Behavior During Therapy WFL for tasks assessed/performed               Past Medical History:  Diagnosis Date   Blood transfusion without reported diagnosis    BPH (benign prostatic hypertrophy)    Cataract    Childhood asthma    COPD (chronic obstructive pulmonary disease) ([place])    Eczema    Ex-smoker    61 PY hx   GERD (gastroesophageal reflux disease)    History of chicken pox    Hypertension    Lower back pain    h/o herniated and bulging discs lower back   Osteoarthritis of neck    Personal history of colonic polyps 05/07/2013   resected not retrieved   PUD (peptic ulcer disease)    S/P splenectomy    partial   Shortness of breath    Past Surgical History:  Procedure Laterality Date   AAA screen  04/2013   neg for AAA   CATARACT EXTRACTION Left 05/2013   [employer] [facility]   CATARACT EXTRACTION W/PHACO Right 10/06/2015   Procedure: CATARACT EXTRACTION PHACO AND INTRAOCULAR LENS PLACEMENT (IOC);  Surgeon: Birder Robson, MD;  Location: [facility] ORS;  Service: Ophthalmology;  Laterality: Right;  [place] AP% CDE fluid pack lot 3 [id number] H exp 04/02/2017   COLONOSCOPY  2009   COLONOSCOPY  05/2013   1 polyp, int/ext hemorrhoids Carlean Purl)   COLONOSCOPY  08/2018   Diminutive adenoma, no f/u planned Carlean Purl)   KNEE ARTHROSCOPY     lower extremity arterial doppler  2013   WNL   SPLENECTOMY, PARTIAL  1998   fall down stairs   Patient Active Problem List    Diagnosis Date Noted   Chronic right-sided lumbar radiculopathy 08/01/2022   Chronic pain syndrome 08/01/2022   Atherosclerosis of aorta ([place]) 05/30/2022   Acute kidney injury ([place]) 05/30/2022   History of partial splenectomy 05/30/2022   Osteoarthritis of right hip 04/13/2022   Calcium pyrophosphate deposition disease (CPPD) 04/13/2022   Diminished pulses in lower extremity 01/14/2022   Right leg pain 10/23/2021   Cerumen impaction 10/20/2020   Abnormal lung sounds 06/26/2019   Allergic rhinitis, seasonal 04/20/2018   Erectile dysfunction of organic origin 06/08/2017   Subclinical hyperthyroidism 05/10/2016   Hyperlipidemia associated with type 2 diabetes mellitus ([place]) 05/01/2016   GERD (gastroesophageal reflux disease) 01/27/2016   Advanced care planning/counseling discussion 04/03/2015   Type 2 diabetes mellitus with other specified complication ([place]) 16/01/3709   COPD (chronic obstructive pulmonary disease) ([place]) 12/10/2013   Skin rash 09/09/2013   Medicare annual wellness visit, subsequent 02/01/2013   Ex-smoker 10/22/2012   Hypertension    Lower back pain     REFERRING DIAG:  M54.41 (ICD-10-CM) - Midline low back pain with right-sided sciatica, unspecified chronicity M79.604 (ICD-10-CM) - Right leg pain   THERAPY DIAG:  Other low back pain  Pain in right leg  Radiculopathy, lumbar region  Rationale for Evaluation and Treatment Rehabilitation  PERTINENT HISTORY: Osteoarthritis of right hip     Known h/o severe OA by [place], he saw ortho Dr Tamera Punt early 2023 and underwent PT course with some benefit. Continues HEP. He feels there's more going on than just R hip osteoarthritis.     PRECAUTIONS: None  SUBJECTIVE:                                                                                                                                                                                      SUBJECTIVE STATEMENT: 10/10 pain this morning, denies injury or trauma.  Had  a pain management consult and will undergo ESI in march.  Piriformis release provided only 1 hour relief following last session.  PAIN:  Are you having pain? Yes: NPRS scale: 1-8/10 Pain location: back and RLE Pain description: ache Aggravating factors: hip flexion, putting on socks Relieving factors: position changes   OBJECTIVE: (objective measures completed at initial evaluation unless otherwise dated)   DIAGNOSTIC FINDINGS:  CLINICAL DATA:  Lumbar radiculopathy.  Right leg pain   EXAM: MRI LUMBAR SPINE WITHOUT CONTRAST   TECHNIQUE: Multiplanar, multisequence MR imaging of the lumbar spine was performed. No intravenous contrast was administered.   COMPARISON:  Lumbar radiographs 06/22/2022   FINDINGS: Segmentation: 5 lumbar vertebra. Lowest disc space L5-S1. Normal segmentation   Alignment:  Mild retrolisthesis L1-2.  Mild anterolisthesis L4-5   Vertebrae: Schmorl's nodes at L1-2 and L3-4. Negative for fracture or mass   Conus medullaris and cauda equina: Conus extends to the L2-3 level. Conus and cauda equina appear normal.   Paraspinal and other soft tissues: Synovial cyst posterior to the facet joints on the right at L2-3. No neural impingement. No soft tissue mass or adenopathy identified.   Disc levels:   L1-2: Mild disc and mild facet degeneration. Mild subarticular stenosis bilaterally   L2-3: Disc bulging and facet degeneration. Mild subarticular stenosis bilaterally.   L3-4: Mild disc bulging and bilateral facet degeneration. Subarticular stenosis left greater than right. Possible left L4 nerve root impingement. Central canal patent   L4-5: Mild anterolisthesis with advanced facet degeneration. Diffuse disc bulging and endplate spurring. Moderate central canal stenosis and moderate subarticular and foraminal stenosis bilaterally   L5-S1: Moderate disc degeneration with disc space narrowing and mild spurring. Mild facet degeneration. Mild  subarticular stenosis bilaterally.   IMPRESSION: 1. Multilevel degenerative change throughout the lumbar spine. 2. Subarticular stenosis left greater than right L3-4. 3. Moderate central canal stenosis L4-5 with moderate subarticular and foraminal stenosis bilaterally. 4. Mild subarticular stenosis bilaterally L5-S1.  Electronically Signed   By: Franchot Gallo M.D.   On: 07/07/2022 16:42     PATIENT SURVEYS:  ODI 7/50 (14% disability)   SCREENING FOR RED FLAGS: negative   COGNITION: Overall cognitive status: Within functional limits for tasks assessed                          SENSATION: Not tested   MUSCLE LENGTH: Hamstrings: Right 70 deg; Left 70 deg Thomas test: neg R   POSTURE: rounded shoulders, forward head, and decreased lumbar lordosis   PALPATION: deferred   LUMBAR ROM:    AROM eval 08/03/22  Flexion 75% 75%  Extension 25% 25%  Right lateral flexion 25% 75%  Left lateral flexion 25% 75%  Right rotation 75% 75%  Left rotation 75% 75%   (Blank rows = not tested)   LOWER EXTREMITY ROM:      Active  Right eval Left eval  Hip flexion 120d    Hip extension 10d    Hip abduction      Hip adduction      Hip internal rotation      Hip external rotation      Knee flexion      Knee extension      Ankle dorsiflexion      Ankle plantarflexion      Ankle inversion      Ankle eversion       (Blank rows = not tested)   LOWER EXTREMITY MMT:     MMT Right eval Left eval  Hip flexion 4    Hip extension 4    Hip abduction 4    Hip adduction      Hip internal rotation      Hip external rotation      Knee flexion 4    Knee extension 4    Ankle dorsiflexion      Ankle plantarflexion      Ankle inversion      Ankle eversion      Trunk/core 3 3   (Blank rows = not tested)   LUMBAR SPECIAL TESTS:  Straight leg raise test: Negative, Slump test: Positive, FABER test: restricted and painful in hip, and Thomas test: Negative   FUNCTIONAL TESTS:   30s chair stand 1 rep   GAIT: Distance walked: 85fx2 Assistive device utilized: Single point cane Level of assistance: Complete Independence Comments: antalgic gait   TODAY'S TREATMENT:    [facility] Adult PT Treatment:                                                DATE: 08/03/22 Therapeutic Exercise: Nustep L4 8 min Seated hamstring stretch 30s x2  Supine QL stretch 30s x2 Bil R hip flexor stretch 30s x2 (bolster) Supine hip fallouts R GTB 15x2 (bolster against LLE) Bridge with ball 15x2 R clams 15x2 Manual Therapy: R piriformis release  [facility] Adult PT Treatment:                                                DATE: 07/29/22 Therapeutic Exercise: Nustep L4 8 min Seated hamstring stretch 30s x2  Supine QL stretch 30s x2 Bil R hip flexor stretch  54s x2 (bolster) Supine hip fallouts R GTB 15x2 (bolster against LLE) Bridge with ball 15x2 POE 2 min hold Manual Therapy: R piriformis release 8 min    [facility] Adult PT Treatment:                                                DATE: 07/27/22 Therapeutic Exercise: Nustep level 6 x 5 mins Side stepping at counter RTB at ankles x4 laps Standing hip extension RTB at ankles 3x10 BIL Omega knee extension 5# 2x10 Omega knee flexion 25# 2x10 Seated hamstring stretch 2x30" BIL PPT 5" hold 2x10 SLR x10 BIL (small ROM and pain on Rt) Supine sciatic nerve glides x20 Rt Supine figure 4 piriformis stretch push pull 2x30" each BIL Modified thomas stretch EOM x1' BIL Bridges 2x10 LTR x10 BIL      PATIENT EDUCATION:  Education details: Discussed eval findings, rehab rationale and POC and patient is in agreement  Person educated: Patient Education method: Explanation Education comprehension: verbalized understanding and needs further education   HOME EXERCISE PROGRAM: Access Code: [email or web address] URL: [email or web address] Date: 07/29/2022 Prepared by: Sharlynn Oliphant  Exercises - Supine Bridge  - 2 x daily - 5 x weekly - 1 sets -  15 reps - Clamshell  - 2 x daily - 5 x weekly - 1 sets - 15 reps - Supine Figure 4 Piriformis Stretch  - 2 x daily - 5 x weekly - 1 sets - 2 reps - 30s hold - Supine Quadratus Lumborum Stretch  - 2 x daily - 5 x weekly - 1 sets - 2 reps - 30s hold   ASSESSMENT:   CLINICAL IMPRESSION: Unchanging pain levels.  Patient movement patterns suggest hip pathology vs. Lumbar derived pain, patient unable to move RLE without UE assist in supported positions of supine and sitting.  Piriformis release did not evoke symptoms and no distinct point tenderness elicited.  ROM gains in rotation and lateral flexion     OBJECTIVE IMPAIRMENTS: .Abnormal gait, decreased activity tolerance, decreased knowledge of condition, decreased knowledge of use of DME, decreased mobility, difficulty walking, decreased strength, increased fascial restrictions, increased muscle spasms, impaired flexibility, and pain    ACTIVITY LIMITATIONS: carrying, lifting, bending, standing, stairs, and bed mobility   PERSONAL FACTORS: Age, Fitness, Past/current experiences, Time since onset of injury/illness/exacerbation, and 1-2 comorbidities: HNP and R hip OA  are also affecting patient's functional outcome.    REHAB POTENTIAL: Fair based on chronicity and unsuccessful conservative treatment to date   CLINICAL DECISION MAKING: Evolving/moderate complexity   EVALUATION COMPLEXITY: Low     GOALS: Goals reviewed with patient? No   SHORT TERM GOALS: Target date: 08/03/2022   Patient to demonstrate independence in HEP  Baseline: [id number] Goal status: Met   2.  Increase deficit lumbar AROM to 50% Baseline:  AROM eval 08/03/22  Flexion 75% 75%  Extension 25% 25%  Right lateral flexion 25% 75%  Left lateral flexion 25% 75%  Right rotation 75% 75%  Left rotation 75% 75%   Goal status: INITIAL       LONG TERM GOALS: Target date: 08/24/2022   Increase trunk and core strength to 3+/5 Baseline: 3/5 trunk strength Goal status:  INITIAL   2.  Decrease worst pain to 6/10 Baseline: 8/10 Goal status: INITIAL   3.  Increase lumbar SB to 75% B Baseline: 25%  B Goal status: INITIAL   4.  Increase 30s chair stand score to 5 arms crossed Baseline: 1 rep arms crossed Goal status: INITIAL       PLAN:   PT FREQUENCY: 2x/week   PT DURATION: 6 weeks   PLANNED INTERVENTIONS: Therapeutic exercises, Therapeutic activity, Neuromuscular re-education, Balance training, Gait training, Patient/Family education, Self Care, Joint mobilization, Stair training, Aquatic Therapy, Manual therapy, and Re-evaluation.   PLAN FOR NEXT SESSION: HEP review, R hip stretch and strength, piriformis stretch, posture training and sciatic glides.   Lanice Shirts, PT 08/03/2022, 10:57 AM

## 2022-08-05 ENCOUNTER — Ambulatory Visit: Payer: Medicare Other | Attending: Family Medicine

## 2022-08-05 DIAGNOSIS — M79604 Pain in right leg: Secondary | ICD-10-CM | POA: Diagnosis not present

## 2022-08-05 DIAGNOSIS — M5416 Radiculopathy, lumbar region: Secondary | ICD-10-CM | POA: Diagnosis not present

## 2022-08-05 DIAGNOSIS — M5459 Other low back pain: Secondary | ICD-10-CM | POA: Diagnosis not present

## 2022-08-05 NOTE — Therapy (Signed)
OUTPATIENT PHYSICAL THERAPY TREATMENT NOTE   Patient Name: Gregory Abbott MRN: [id number] DOB:09/15/1945, 77 y.o., male Today's Date: 08/05/2022  PCP: Ria Bush, MD   REFERRING PROVIDER: Ria Bush, MD    END OF SESSION:   PT End of Session - 08/05/22 0956     Visit Number 7    Number of Visits 12    Date for PT Re-Evaluation 09/07/22    Authorization Type MCR    PT Start Time 7253    PT Stop Time 6644    PT Time Calculation (min) 45 min    Activity Tolerance Patient tolerated treatment well    Behavior During Therapy WFL for tasks assessed/performed                Past Medical History:  Diagnosis Date   Blood transfusion without reported diagnosis    BPH (benign prostatic hypertrophy)    Cataract    Childhood asthma    COPD (chronic obstructive pulmonary disease) ([place])    Eczema    Ex-smoker    50 PY hx   GERD (gastroesophageal reflux disease)    History of chicken pox    Hypertension    Lower back pain    h/o herniated and bulging discs lower back   Osteoarthritis of neck    Personal history of colonic polyps 05/07/2013   resected not retrieved   PUD (peptic ulcer disease)    S/P splenectomy    partial   Shortness of breath    Past Surgical History:  Procedure Laterality Date   AAA screen  04/2013   neg for AAA   CATARACT EXTRACTION Left 05/2013   [facility]   CATARACT EXTRACTION W/PHACO Right 10/06/2015   Procedure: CATARACT EXTRACTION PHACO AND INTRAOCULAR LENS PLACEMENT (IOC);  Surgeon: Birder Robson, MD;  Location: [facility] ORS;  Service: Ophthalmology;  Laterality: Right;  [place] AP% CDE fluid pack lot [id number] H exp 04/02/2017   COLONOSCOPY  2009   COLONOSCOPY  05/2013   1 polyp, int/ext hemorrhoids Carlean Purl)   COLONOSCOPY  08/2018   Diminutive adenoma, no f/u planned Carlean Purl)   KNEE ARTHROSCOPY     lower extremity arterial doppler  2013   WNL   SPLENECTOMY, PARTIAL  1998   fall down stairs   Patient Active Problem List    Diagnosis Date Noted   Chronic right-sided lumbar radiculopathy 08/01/2022   Chronic pain syndrome 08/01/2022   Atherosclerosis of aorta ([place]) 05/30/2022   Acute kidney injury ([place]) 05/30/2022   History of partial splenectomy 05/30/2022   Osteoarthritis of right hip 04/13/2022   Calcium pyrophosphate deposition disease (CPPD) 04/13/2022   Diminished pulses in lower extremity 01/14/2022   Right leg pain 10/23/2021   Cerumen impaction 10/20/2020   Abnormal lung sounds 06/26/2019   Allergic rhinitis, seasonal 04/20/2018   Erectile dysfunction of organic origin 06/08/2017   Subclinical hyperthyroidism 05/10/2016   Hyperlipidemia associated with type 2 diabetes mellitus ([place]) 05/01/2016   GERD (gastroesophageal reflux disease) 01/27/2016   Advanced care planning/counseling discussion 04/03/2015   Type 2 diabetes mellitus with other specified complication ([place]) 95/63/8756   COPD (chronic obstructive pulmonary disease) ([place]) 12/10/2013   Skin rash 09/09/2013   Medicare annual wellness visit, subsequent 02/01/2013   Ex-smoker 10/22/2012   Hypertension    Lower back pain     REFERRING DIAG:  M54.41 (ICD-10-CM) - Midline low back pain with right-sided sciatica, unspecified chronicity M79.604 (ICD-10-CM) - Right leg pain   THERAPY DIAG:  Other low back  pain  Pain in right leg  Radiculopathy, lumbar region  Rationale for Evaluation and Treatment Rehabilitation  PERTINENT HISTORY: Osteoarthritis of right hip     Known h/o severe OA by [place], he saw ortho Dr Tamera Punt early 2023 and underwent PT course with some benefit. Continues HEP. He feels there's more going on than just R hip osteoarthritis.     PRECAUTIONS: None  SUBJECTIVE:                                                                                                                                                                                      SUBJECTIVE STATEMENT:   PAIN:  Are you having pain? Yes: NPRS scale:  1-8/10 Pain location: back and RLE Pain description: ache Aggravating factors: hip flexion, putting on socks Relieving factors: position changes   OBJECTIVE: (objective measures completed at initial evaluation unless otherwise dated)   DIAGNOSTIC FINDINGS:  CLINICAL DATA:  Lumbar radiculopathy.  Right leg pain   EXAM: MRI LUMBAR SPINE WITHOUT CONTRAST   TECHNIQUE: Multiplanar, multisequence MR imaging of the lumbar spine was performed. No intravenous contrast was administered.   COMPARISON:  Lumbar radiographs 06/22/2022   FINDINGS: Segmentation: 5 lumbar vertebra. Lowest disc space L5-S1. Normal segmentation   Alignment:  Mild retrolisthesis L1-2.  Mild anterolisthesis L4-5   Vertebrae: Schmorl's nodes at L1-2 and L3-4. Negative for fracture or mass   Conus medullaris and cauda equina: Conus extends to the L2-3 level. Conus and cauda equina appear normal.   Paraspinal and other soft tissues: Synovial cyst posterior to the facet joints on the right at L2-3. No neural impingement. No soft tissue mass or adenopathy identified.   Disc levels:   L1-2: Mild disc and mild facet degeneration. Mild subarticular stenosis bilaterally   L2-3: Disc bulging and facet degeneration. Mild subarticular stenosis bilaterally.   L3-4: Mild disc bulging and bilateral facet degeneration. Subarticular stenosis left greater than right. Possible left L4 nerve root impingement. Central canal patent   L4-5: Mild anterolisthesis with advanced facet degeneration. Diffuse disc bulging and endplate spurring. Moderate central canal stenosis and moderate subarticular and foraminal stenosis bilaterally   L5-S1: Moderate disc degeneration with disc space narrowing and mild spurring. Mild facet degeneration. Mild subarticular stenosis bilaterally.   IMPRESSION: 1. Multilevel degenerative change throughout the lumbar spine. 2. Subarticular stenosis left greater than right L3-4. 3. Moderate  central canal stenosis L4-5 with moderate subarticular and foraminal stenosis bilaterally. 4. Mild subarticular stenosis bilaterally L5-S1.     Electronically Signed   By: Franchot Gallo M.D.   On: 07/07/2022 16:42     PATIENT SURVEYS:  ODI 7/50 (14% disability)   SCREENING  FOR RED FLAGS: negative   COGNITION: Overall cognitive status: Within functional limits for tasks assessed                          SENSATION: Not tested   MUSCLE LENGTH: Hamstrings: Right 70 deg; Left 70 deg Thomas test: neg R   POSTURE: rounded shoulders, forward head, and decreased lumbar lordosis   PALPATION: deferred   LUMBAR ROM:    AROM eval 08/03/22  Flexion 75% 75%  Extension 25% 25%  Right lateral flexion 25% 75%  Left lateral flexion 25% 75%  Right rotation 75% 75%  Left rotation 75% 75%   (Blank rows = not tested)   LOWER EXTREMITY ROM:      Active  Right eval Left eval  Hip flexion 120d    Hip extension 10d    Hip abduction      Hip adduction      Hip internal rotation      Hip external rotation      Knee flexion      Knee extension      Ankle dorsiflexion      Ankle plantarflexion      Ankle inversion      Ankle eversion       (Blank rows = not tested)   LOWER EXTREMITY MMT:     MMT Right eval Left eval  Hip flexion 4    Hip extension 4    Hip abduction 4    Hip adduction      Hip internal rotation      Hip external rotation      Knee flexion 4    Knee extension 4    Ankle dorsiflexion      Ankle plantarflexion      Ankle inversion      Ankle eversion      Trunk/core 3 3   (Blank rows = not tested)   LUMBAR SPECIAL TESTS:  Straight leg raise test: Negative, Slump test: Positive, FABER test: restricted and painful in hip, and Thomas test: Negative   FUNCTIONAL TESTS:  30s chair stand 1 rep   GAIT: Distance walked: 10fx2 Assistive device utilized: Single point cane Level of assistance: Complete Independence Comments: antalgic gait   TODAY'S  TREATMENT:    [facility] Adult PT Treatment:                                                DATE: 08/05/22 Therapeutic Exercise: Nustep L4 8 min Seated hamstring stretch 30s x2  Supine QL stretch 30s x2 Bil R hip flexor stretch 30s x2 (bolster) Supine hip fallouts R GTB 15x2 (bolster against LLE) Bridge with 5# ball 15x2 R clams 15x2 GTB R abduction in S/L with PT assist to minimize hip hike 15x2 R SLR 2# 15x(painful) Manual Therapy: + hip scouring, + FADIR, +FABER for RLE symptoms  [facility] Adult PT Treatment:                                                DATE: 08/03/22 Therapeutic Exercise: Nustep L4 8 min Seated hamstring stretch 30s x2  Supine QL stretch 30s x2 Bil R hip flexor stretch 30s x2 (bolster) Supine  hip fallouts R GTB 15x2 (bolster against LLE) Bridge with ball 15x2 R clams 15x2 Manual Therapy: R piriformis release  [facility] Adult PT Treatment:                                                DATE: 07/29/22 Therapeutic Exercise: Nustep L4 8 min Seated hamstring stretch 30s x2  Supine QL stretch 30s x2 Bil R hip flexor stretch 30s x2 (bolster) Supine hip fallouts R GTB 15x2 (bolster against LLE) Bridge with ball 15x2 POE 2 min hold Manual Therapy: R piriformis release 8 min    [facility] Adult PT Treatment:                                                DATE: 07/27/22 Therapeutic Exercise: Nustep level 6 x 5 mins Side stepping at counter RTB at ankles x4 laps Standing hip extension RTB at ankles 3x10 BIL Omega knee extension 5# 2x10 Omega knee flexion 25# 2x10 Seated hamstring stretch 2x30" BIL PPT 5" hold 2x10 SLR x10 BIL (small ROM and pain on Rt) Supine sciatic nerve glides x20 Rt Supine figure 4 piriformis stretch push pull 2x30" each BIL Modified thomas stretch EOM x1' BIL Bridges 2x10 LTR x10 BIL      PATIENT EDUCATION:  Education details: Discussed eval findings, rehab rationale and POC and patient is in agreement  Person educated: Patient Education method:  Explanation Education comprehension: verbalized understanding and needs further education   HOME EXERCISE PROGRAM: Access Code: [email or web address] URL: [email or web address] Date: 07/29/2022 Prepared by: Sharlynn Oliphant  Exercises - Supine Bridge  - 2 x daily - 5 x weekly - 1 sets - 15 reps - Clamshell  - 2 x daily - 5 x weekly - 1 sets - 15 reps - Supine Figure 4 Piriformis Stretch  - 2 x daily - 5 x weekly - 1 sets - 2 reps - 30s hold - Supine Quadratus Lumborum Stretch  - 2 x daily - 5 x weekly - 1 sets - 2 reps - 30s hold   ASSESSMENT:   CLINICAL IMPRESSION: Pain levels have decreased as hi gabapentin dosage has increased.  Continued to strengthen R hip and focused assessment continued to show S&S of OA with hip testing reproducing RLE symptoms.  Palpable crepitus felt in both R hip and knee with resisted tasks suggesting hip/knee degenerative changes generating patient symptoms.    OBJECTIVE IMPAIRMENTS: .Abnormal gait, decreased activity tolerance, decreased knowledge of condition, decreased knowledge of use of DME, decreased mobility, difficulty walking, decreased strength, increased fascial restrictions, increased muscle spasms, impaired flexibility, and pain    ACTIVITY LIMITATIONS: carrying, lifting, bending, standing, stairs, and bed mobility   PERSONAL FACTORS: Age, Fitness, Past/current experiences, Time since onset of injury/illness/exacerbation, and 1-2 comorbidities: HNP and R hip OA  are also affecting patient's functional outcome.    REHAB POTENTIAL: Fair based on chronicity and unsuccessful conservative treatment to date   CLINICAL DECISION MAKING: Evolving/moderate complexity   EVALUATION COMPLEXITY: Low     GOALS: Goals reviewed with patient? No   SHORT TERM GOALS: Target date: 08/03/2022   Patient to demonstrate independence in HEP  Baseline: [id number] Goal status: Met   2.  Increase deficit lumbar AROM to  50% Baseline:  AROM eval 08/03/22  Flexion  75% 75%  Extension 25% 25%  Right lateral flexion 25% 75%  Left lateral flexion 25% 75%  Right rotation 75% 75%  Left rotation 75% 75%   Goal status: INITIAL       LONG TERM GOALS: Target date: 08/24/2022   Increase trunk and core strength to 3+/5 Baseline: 3/5 trunk strength Goal status: INITIAL   2.  Decrease worst pain to 6/10 Baseline: 8/10 Goal status: INITIAL   3.  Increase lumbar SB to 75% B Baseline: 25% B Goal status: INITIAL   4.  Increase 30s chair stand score to 5 arms crossed Baseline: 1 rep arms crossed Goal status: INITIAL       PLAN:   PT FREQUENCY: 2x/week   PT DURATION: 6 weeks   PLANNED INTERVENTIONS: Therapeutic exercises, Therapeutic activity, Neuromuscular re-education, Balance training, Gait training, Patient/Family education, Self Care, Joint mobilization, Stair training, Aquatic Therapy, Manual therapy, and Re-evaluation.   PLAN FOR NEXT SESSION: HEP review, R hip stretch and strength, piriformis stretch, posture training and sciatic glides.   Lanice Shirts, PT 08/05/2022, 10:58 AM

## 2022-08-10 ENCOUNTER — Ambulatory Visit: Payer: Medicare Other

## 2022-08-10 DIAGNOSIS — M5459 Other low back pain: Secondary | ICD-10-CM

## 2022-08-10 DIAGNOSIS — M79604 Pain in right leg: Secondary | ICD-10-CM

## 2022-08-10 DIAGNOSIS — M5416 Radiculopathy, lumbar region: Secondary | ICD-10-CM | POA: Diagnosis not present

## 2022-08-10 NOTE — Therapy (Signed)
OUTPATIENT PHYSICAL THERAPY TREATMENT NOTE   Patient Name: Gregory Abbott MRN: [id number] DOB:20-Apr-1946, 77 y.o., male Today's Date: 08/10/2022  PCP: Ria Bush, MD   REFERRING PROVIDER: Ria Bush, MD    END OF SESSION:   PT End of Session - 08/10/22 0952     Visit Number 8    Number of Visits 12    Date for PT Re-Evaluation 09/07/22    Authorization Type MCR    PT Start Time 1000    PT Stop Time 8502    PT Time Calculation (min) 40 min    Activity Tolerance Patient tolerated treatment well    Behavior During Therapy WFL for tasks assessed/performed                Past Medical History:  Diagnosis Date   Blood transfusion without reported diagnosis    BPH (benign prostatic hypertrophy)    Cataract    Childhood asthma    COPD (chronic obstructive pulmonary disease) ([place])    Eczema    Ex-smoker    14 PY hx   GERD (gastroesophageal reflux disease)    History of chicken pox    Hypertension    Lower back pain    h/o herniated and bulging discs lower back   Osteoarthritis of neck    Personal history of colonic polyps 05/07/2013   resected not retrieved   PUD (peptic ulcer disease)    S/P splenectomy    partial   Shortness of breath    Past Surgical History:  Procedure Laterality Date   AAA screen  04/2013   neg for AAA   CATARACT EXTRACTION Left 05/2013   [facility]   CATARACT EXTRACTION W/PHACO Right 10/06/2015   Procedure: CATARACT EXTRACTION PHACO AND INTRAOCULAR LENS PLACEMENT (IOC);  Surgeon: Birder Robson, MD;  Location: [facility] ORS;  Service: Ophthalmology;  Laterality: Right;  [place] AP% CDE fluid pack lot 3 [id number] H exp 04/02/2017   COLONOSCOPY  2009   COLONOSCOPY  05/2013   1 polyp, int/ext hemorrhoids Carlean Purl)   COLONOSCOPY  08/2018   Diminutive adenoma, no f/u planned Carlean Purl)   KNEE ARTHROSCOPY     lower extremity arterial doppler  2013   WNL   SPLENECTOMY, PARTIAL  1998   fall down stairs   Patient Active Problem List    Diagnosis Date Noted   Chronic right-sided lumbar radiculopathy 08/01/2022   Chronic pain syndrome 08/01/2022   Atherosclerosis of aorta ([place]) 05/30/2022   Acute kidney injury ([place]) 05/30/2022   History of partial splenectomy 05/30/2022   Osteoarthritis of right hip 04/13/2022   Calcium pyrophosphate deposition disease (CPPD) 04/13/2022   Diminished pulses in lower extremity 01/14/2022   Right leg pain 10/23/2021   Cerumen impaction 10/20/2020   Abnormal lung sounds 06/26/2019   Allergic rhinitis, seasonal 04/20/2018   Erectile dysfunction of organic origin 06/08/2017   Subclinical hyperthyroidism 05/10/2016   Hyperlipidemia associated with type 2 diabetes mellitus ([place]) 05/01/2016   GERD (gastroesophageal reflux disease) 01/27/2016   Advanced care planning/counseling discussion 04/03/2015   Type 2 diabetes mellitus with other specified complication ([place]) 86/76/7209   COPD (chronic obstructive pulmonary disease) ([place]) 12/10/2013   Skin rash 09/09/2013   Medicare annual wellness visit, subsequent 02/01/2013   Ex-smoker 10/22/2012   Hypertension    Lower back pain     REFERRING DIAG:  M54.41 (ICD-10-CM) - Midline low back pain with right-sided sciatica, unspecified chronicity M79.604 (ICD-10-CM) - Right leg pain   THERAPY DIAG:  Other low back  pain  Pain in right leg  Radiculopathy, lumbar region  Rationale for Evaluation and Treatment Rehabilitation  PERTINENT HISTORY: Osteoarthritis of right hip     Known h/o severe OA by [place], he saw ortho Dr Tamera Punt early 2023 and underwent PT course with some benefit. Continues HEP. He feels there's more going on than just R hip osteoarthritis.     PRECAUTIONS: None  SUBJECTIVE:                                                                                                                                                                                      SUBJECTIVE STATEMENT: Unchanging symptoms and pain levels, no interest in  pursuing R hip ortho recheck to address underlying OA.  Reports pain at 2-3/10 on a good day when using Gabapentin and votaren.  Pain increases to 8/10 once medication ears off and he has been on his feet for a while.  PAIN:  Are you having pain? Yes: NPRS scale: 1-8/10 Pain location: back and RLE Pain description: ache Aggravating factors: hip flexion, putting on socks Relieving factors: position changes   OBJECTIVE: (objective measures completed at initial evaluation unless otherwise dated)   DIAGNOSTIC FINDINGS:  CLINICAL DATA:  Lumbar radiculopathy.  Right leg pain   EXAM: MRI LUMBAR SPINE WITHOUT CONTRAST   TECHNIQUE: Multiplanar, multisequence MR imaging of the lumbar spine was performed. No intravenous contrast was administered.   COMPARISON:  Lumbar radiographs 06/22/2022   FINDINGS: Segmentation: 5 lumbar vertebra. Lowest disc space L5-S1. Normal segmentation   Alignment:  Mild retrolisthesis L1-2.  Mild anterolisthesis L4-5   Vertebrae: Schmorl's nodes at L1-2 and L3-4. Negative for fracture or mass   Conus medullaris and cauda equina: Conus extends to the L2-3 level. Conus and cauda equina appear normal.   Paraspinal and other soft tissues: Synovial cyst posterior to the facet joints on the right at L2-3. No neural impingement. No soft tissue mass or adenopathy identified.   Disc levels:   L1-2: Mild disc and mild facet degeneration. Mild subarticular stenosis bilaterally   L2-3: Disc bulging and facet degeneration. Mild subarticular stenosis bilaterally.   L3-4: Mild disc bulging and bilateral facet degeneration. Subarticular stenosis left greater than right. Possible left L4 nerve root impingement. Central canal patent   L4-5: Mild anterolisthesis with advanced facet degeneration. Diffuse disc bulging and endplate spurring. Moderate central canal stenosis and moderate subarticular and foraminal stenosis bilaterally   L5-S1: Moderate disc  degeneration with disc space narrowing and mild spurring. Mild facet degeneration. Mild subarticular stenosis bilaterally.   IMPRESSION: 1. Multilevel degenerative change throughout the lumbar spine. 2. Subarticular stenosis left greater than right L3-4. 3. Moderate  central canal stenosis L4-5 with moderate subarticular and foraminal stenosis bilaterally. 4. Mild subarticular stenosis bilaterally L5-S1.     Electronically Signed   By: Franchot Gallo M.D.   On: 07/07/2022 16:42     PATIENT SURVEYS:  ODI 7/50 (14% disability)   SCREENING FOR RED FLAGS: negative   COGNITION: Overall cognitive status: Within functional limits for tasks assessed                          SENSATION: Not tested   MUSCLE LENGTH: Hamstrings: Right 70 deg; Left 70 deg Thomas test: neg R   POSTURE: rounded shoulders, forward head, and decreased lumbar lordosis   PALPATION: deferred   LUMBAR ROM:    AROM eval 08/03/22  Flexion 75% 75%  Extension 25% 25%  Right lateral flexion 25% 75%  Left lateral flexion 25% 75%  Right rotation 75% 75%  Left rotation 75% 75%   (Blank rows = not tested)   LOWER EXTREMITY ROM:      Active  Right eval Left eval  Hip flexion 120d    Hip extension 10d    Hip abduction      Hip adduction      Hip internal rotation      Hip external rotation      Knee flexion      Knee extension      Ankle dorsiflexion      Ankle plantarflexion      Ankle inversion      Ankle eversion       (Blank rows = not tested)   LOWER EXTREMITY MMT:     MMT Right eval Left eval  Hip flexion 4    Hip extension 4    Hip abduction 4    Hip adduction      Hip internal rotation      Hip external rotation      Knee flexion 4    Knee extension 4    Ankle dorsiflexion      Ankle plantarflexion      Ankle inversion      Ankle eversion      Trunk/core 3 3   (Blank rows = not tested)   LUMBAR SPECIAL TESTS:  Straight leg raise test: Negative, Slump test: Positive, FABER  test: restricted and painful in hip, and Thomas test: Negative   FUNCTIONAL TESTS:  30s chair stand 1 rep   GAIT: Distance walked: 68fx2 Assistive device utilized: Single point cane Level of assistance: Complete Independence Comments: antalgic gait   TODAY'S TREATMENT:    [facility] Adult PT Treatment:                                                DATE: 08/10/22 Therapeutic Exercise: Nustep L5 8 min Seated hamstring stretch 30s x2  Supine QL stretch 30s x2 Bil Piriformis stretch cross body 30s x2 Bil R hip flexor stretch 30s x2 (bolster) Supine hip fallouts R GTB 15x2 (bolster against LLE) Bridge with 5# ball 15x2 R clams 15x2 GTB R abduction in S/L with PT assist to minimize hip hike 15x2 R SLR 15x(painful)  [facility] Adult PT Treatment:  DATE: 08/05/22 Therapeutic Exercise: Nustep L4 8 min Seated hamstring stretch 30s x2  Supine QL stretch 30s x2 Bil R hip flexor stretch 30s x2 (bolster) Supine hip fallouts R GTB 15x2 (bolster against LLE) Bridge with 5# ball 15x2 R clams 15x2 GTB R abduction in S/L with PT assist to minimize hip hike 15x2 R SLR 2# 15x(painful) R piriformis stretch cross body 30s x2 Bil    [facility] Adult PT Treatment:                                                DATE: 08/03/22 Therapeutic Exercise: Nustep L4 8 min Seated hamstring stretch 30s x2  Supine QL stretch 30s x2 Bil R hip flexor stretch 30s x2 (bolster) Supine hip fallouts R GTB 15x2 (bolster against LLE) Bridge with ball 15x2 R clams 15x2 Manual Therapy: R piriformis release  [facility] Adult PT Treatment:                                                DATE: 07/29/22 Therapeutic Exercise: Nustep L4 8 min Seated hamstring stretch 30s x2  Supine QL stretch 30s x2 Bil R hip flexor stretch 30s x2 (bolster) Supine hip fallouts R GTB 15x2 (bolster against LLE) Bridge with ball 15x2 POE 2 min hold Manual Therapy: R piriformis release 8 min    [facility] Adult PT  Treatment:                                                DATE: 07/27/22 Therapeutic Exercise: Nustep level 6 x 5 mins Side stepping at counter RTB at ankles x4 laps Standing hip extension RTB at ankles 3x10 BIL Omega knee extension 5# 2x10 Omega knee flexion 25# 2x10 Seated hamstring stretch 2x30" BIL PPT 5" hold 2x10 SLR x10 BIL (small ROM and pain on Rt) Supine sciatic nerve glides x20 Rt Supine figure 4 piriformis stretch push pull 2x30" each BIL Modified thomas stretch EOM x1' BIL Bridges 2x10 LTR x10 BIL      PATIENT EDUCATION:  Education details: Discussed eval findings, rehab rationale and POC and patient is in agreement  Person educated: Patient Education method: Explanation Education comprehension: verbalized understanding and needs further education   HOME EXERCISE PROGRAM: Access Code: [email or web address] URL: [email or web address] Date: 07/29/2022 Prepared by: Sharlynn Oliphant  Exercises - Supine Bridge  - 2 x daily - 5 x weekly - 1 sets - 15 reps - Clamshell  - 2 x daily - 5 x weekly - 1 sets - 15 reps - Supine Figure 4 Piriformis Stretch  - 2 x daily - 5 x weekly - 1 sets - 2 reps - 30s hold - Supine Quadratus Lumborum Stretch  - 2 x daily - 5 x weekly - 1 sets - 2 reps - 30s hold   ASSESSMENT:   CLINICAL IMPRESSION: Today's session decreased resistance on tasks due to pain levels.  Continued marked crepitus noted in R hip with concordant weakness of hip flexion and abduction forcing patient to substitute with accessory muscle to perform tasks/motions.     OBJECTIVE IMPAIRMENTS: .Abnormal gait, decreased  activity tolerance, decreased knowledge of condition, decreased knowledge of use of DME, decreased mobility, difficulty walking, decreased strength, increased fascial restrictions, increased muscle spasms, impaired flexibility, and pain    ACTIVITY LIMITATIONS: carrying, lifting, bending, standing, stairs, and bed mobility   PERSONAL FACTORS: Age, Fitness,  Past/current experiences, Time since onset of injury/illness/exacerbation, and 1-2 comorbidities: HNP and R hip OA  are also affecting patient's functional outcome.    REHAB POTENTIAL: Fair based on chronicity and unsuccessful conservative treatment to date   CLINICAL DECISION MAKING: Evolving/moderate complexity   EVALUATION COMPLEXITY: Low     GOALS: Goals reviewed with patient? No   SHORT TERM GOALS: Target date: 08/03/2022   Patient to demonstrate independence in HEP  Baseline: [id number] Goal status: Met   2.  Increase deficit lumbar AROM to 50% Baseline:  AROM eval 08/03/22  Flexion 75% 75%  Extension 25% 25%  Right lateral flexion 25% 75%  Left lateral flexion 25% 75%  Right rotation 75% 75%  Left rotation 75% 75%   Goal status: Ongoing       LONG TERM GOALS: Target date: 08/24/2022   Increase trunk and core strength to 3+/5 Baseline: 3/5 trunk strength Goal status: INITIAL   2.  Decrease worst pain to 6/10 Baseline: 8/10 Goal status: INITIAL   3.  Increase lumbar SB to 75% B Baseline:  AROM eval 08/03/22  Flexion 75% 75%  Extension 25% 25%  Right lateral flexion 25% 75%  Left lateral flexion 25% 75%  Right rotation 75% 75%  Left rotation 75% 75%   Goal status: Met   4.  Increase 30s chair stand score to 5 arms crossed Baseline: 1 rep arms crossed Goal status: INITIAL       PLAN:   PT FREQUENCY: 2x/week   PT DURATION: 6 weeks   PLANNED INTERVENTIONS: Therapeutic exercises, Therapeutic activity, Neuromuscular re-education, Balance training, Gait training, Patient/Family education, Self Care, Joint mobilization, Stair training, Aquatic Therapy, Manual therapy, and Re-evaluation.   PLAN FOR NEXT SESSION: HEP review, R hip stretch and strength, piriformis stretch, posture training and sciatic glides.   Lanice Shirts, PT 08/10/2022, 10:43 AM

## 2022-08-12 ENCOUNTER — Ambulatory Visit: Payer: Medicare Other

## 2022-08-12 DIAGNOSIS — M5416 Radiculopathy, lumbar region: Secondary | ICD-10-CM

## 2022-08-12 DIAGNOSIS — M79604 Pain in right leg: Secondary | ICD-10-CM

## 2022-08-12 DIAGNOSIS — M5459 Other low back pain: Secondary | ICD-10-CM

## 2022-08-12 NOTE — Therapy (Signed)
OUTPATIENT PHYSICAL THERAPY TREATMENT NOTE/DC SUMMARY   Patient Name: Gregory Abbott MRN: [id number] DOB:[birth year], 77 y.o., male Today's Date: 08/12/2022  PCP: Ria Bush, MD   REFERRING PROVIDER: Ria Bush, MD   PHYSICAL THERAPY DISCHARGE SUMMARY  Visits from Start of Care: 9  Current functional level related to goals / functional outcomes: Symptoms unchanged   Remaining deficits: Weakness, ROM and pain   Education / Equipment: HEP   Patient agrees to discharge. Patient goals were partially met. Patient is being discharged due to did not respond to therapy.   END OF SESSION:   PT End of Session - 08/12/22 0952     Visit Number 9    Number of Visits 12    Date for PT Re-Evaluation 09/07/22    Authorization Type MCR    PT Start Time [id number]    PT Stop Time [id number]    PT Time Calculation (min) 45 min    Activity Tolerance Patient tolerated treatment well    Behavior During Therapy WFL for tasks assessed/performed                 Past Medical History:  Diagnosis Date   Blood transfusion without reported diagnosis    BPH (benign prostatic hypertrophy)    Cataract    Childhood asthma    COPD (chronic obstructive pulmonary disease) ([place])    Eczema    Ex-smoker    34 PY hx   GERD (gastroesophageal reflux disease)    History of chicken pox    Hypertension    Lower back pain    h/o herniated and bulging discs lower back   Osteoarthritis of neck    Personal history of colonic polyps 05/07/2013   resected not retrieved   PUD (peptic ulcer disease)    S/P splenectomy    partial   Shortness of breath    Past Surgical History:  Procedure Laterality Date   AAA screen  04/2013   neg for AAA   CATARACT EXTRACTION Left 05/2013   [facility]   CATARACT EXTRACTION W/PHACO Right 10/06/2015   Procedure: CATARACT EXTRACTION PHACO AND INTRAOCULAR LENS PLACEMENT ([place]);  Surgeon: Birder Robson, MD;  Location: [facility] ORS;  Service: Ophthalmology;   Laterality: Right;  [place] AP% CDE fluid pack lot 3 [id number] H exp 04/02/2017   COLONOSCOPY  2009   COLONOSCOPY  05/2013   1 polyp, int/ext hemorrhoids Carlean Purl)   COLONOSCOPY  08/2018   Diminutive adenoma, no f/u planned Carlean Purl)   KNEE ARTHROSCOPY     lower extremity arterial doppler  2013   WNL   SPLENECTOMY, PARTIAL  1998   fall down stairs   Patient Active Problem List   Diagnosis Date Noted   Chronic right-sided lumbar radiculopathy 08/01/2022   Chronic pain syndrome 08/01/2022   Atherosclerosis of aorta ([place]) 05/30/2022   Acute kidney injury ([place]) 05/30/2022   History of partial splenectomy 05/30/2022   Osteoarthritis of right hip 04/13/2022   Calcium pyrophosphate deposition disease (CPPD) 04/13/2022   Diminished pulses in lower extremity 01/14/2022   Right leg pain 10/23/2021   Cerumen impaction 10/20/2020   Abnormal lung sounds 06/26/2019   Allergic rhinitis, seasonal 04/20/2018   Erectile dysfunction of organic origin 06/08/2017   Subclinical hyperthyroidism 05/10/2016   Hyperlipidemia associated with type 2 diabetes mellitus ([place]) 05/01/2016   GERD (gastroesophageal reflux disease) 01/27/2016   Advanced care planning/counseling discussion 04/03/2015   Type 2 diabetes mellitus with other specified complication ([place]) 0000000   COPD (chronic obstructive pulmonary  disease) ([place]) 12/10/2013   Skin rash 09/09/2013   Medicare annual wellness visit, subsequent 02/01/2013   Ex-smoker 10/22/2012   Hypertension    Lower back pain     REFERRING DIAG:  M54.41 (ICD-10-CM) - Midline low back pain with right-sided sciatica, unspecified chronicity M79.604 (ICD-10-CM) - Right leg pain   THERAPY DIAG:  Other low back pain  Pain in right leg  Radiculopathy, lumbar region  Rationale for Evaluation and Treatment Rehabilitation  PERTINENT HISTORY: Osteoarthritis of right hip     Known h/o severe OA by xrays, he saw ortho Dr Tamera Punt early 2023 and underwent PT course with  some benefit. Continues HEP. He feels there's more going on than just R hip osteoarthritis.     PRECAUTIONS: None  SUBJECTIVE:                                                                                                                                                                                      SUBJECTIVE STATEMENT: No real progress to note, symptoms appear to onset with rotational R hip motions with long lasting relief reported with PT intervention to date.  Agreeable to DC PT at this time and follow through with upcoming ESI  PAIN:  Are you having pain? Yes: NPRS scale: 1-8/10 Pain location: back and RLE Pain description: ache Aggravating factors: hip flexion, putting on socks Relieving factors: position changes   OBJECTIVE: (objective measures completed at initial evaluation unless otherwise dated)   DIAGNOSTIC FINDINGS:  CLINICAL DATA:  Lumbar radiculopathy.  Right leg pain   EXAM: MRI LUMBAR SPINE WITHOUT CONTRAST   TECHNIQUE: Multiplanar, multisequence MR imaging of the lumbar spine was performed. No intravenous contrast was administered.   COMPARISON:  Lumbar radiographs 06/22/2022   FINDINGS: Segmentation: 5 lumbar vertebra. Lowest disc space L5-S1. Normal segmentation   Alignment:  Mild retrolisthesis L1-2.  Mild anterolisthesis L4-5   Vertebrae: Schmorl's nodes at L1-2 and L3-4. Negative for fracture or mass   Conus medullaris and cauda equina: Conus extends to the L2-3 level. Conus and cauda equina appear normal.   Paraspinal and other soft tissues: Synovial cyst posterior to the facet joints on the right at L2-3. No neural impingement. No soft tissue mass or adenopathy identified.   Disc levels:   L1-2: Mild disc and mild facet degeneration. Mild subarticular stenosis bilaterally   L2-3: Disc bulging and facet degeneration. Mild subarticular stenosis bilaterally.   L3-4: Mild disc bulging and bilateral facet degeneration. Subarticular  stenosis left greater than right. Possible left L4 nerve root impingement. Central canal patent   L4-5: Mild anterolisthesis with advanced facet degeneration. Diffuse disc bulging and endplate spurring. Moderate central  canal stenosis and moderate subarticular and foraminal stenosis bilaterally   L5-S1: Moderate disc degeneration with disc space narrowing and mild spurring. Mild facet degeneration. Mild subarticular stenosis bilaterally.   IMPRESSION: 1. Multilevel degenerative change throughout the lumbar spine. 2. Subarticular stenosis left greater than right L3-4. 3. Moderate central canal stenosis L4-5 with moderate subarticular and foraminal stenosis bilaterally. 4. Mild subarticular stenosis bilaterally L5-S1.     Electronically Signed   By: Franchot Gallo M.D.   On: 07/07/2022 16:42     PATIENT SURVEYS:  ODI 7/50 (14% disability)   SCREENING FOR RED FLAGS: negative   COGNITION: Overall cognitive status: Within functional limits for tasks assessed                          SENSATION: Not tested   MUSCLE LENGTH: Hamstrings: Right 70 deg; Left 70 deg Thomas test: neg R   POSTURE: rounded shoulders, forward head, and decreased lumbar lordosis   PALPATION: deferred   LUMBAR ROM:    AROM eval 08/03/22  Flexion 75% 75%  Extension 25% 25%  Right lateral flexion 25% 75%  Left lateral flexion 25% 75%  Right rotation 75% 75%  Left rotation 75% 75%   (Blank rows = not tested)   LOWER EXTREMITY ROM:      Active  Right eval Left eval  Hip flexion 120d    Hip extension 10d    Hip abduction      Hip adduction      Hip internal rotation      Hip external rotation      Knee flexion      Knee extension      Ankle dorsiflexion      Ankle plantarflexion      Ankle inversion      Ankle eversion       (Blank rows = not tested)   LOWER EXTREMITY MMT:     MMT Right eval Left eval  Hip flexion 4    Hip extension 4    Hip abduction 4    Hip adduction       Hip internal rotation      Hip external rotation      Knee flexion 4    Knee extension 4    Ankle dorsiflexion      Ankle plantarflexion      Ankle inversion      Ankle eversion      Trunk/core 3 3   (Blank rows = not tested)   LUMBAR SPECIAL TESTS:  Straight leg raise test: Negative, Slump test: Positive, FABER test: restricted and painful in hip, and Thomas test: Negative   FUNCTIONAL TESTS:  30s chair stand 1 rep   GAIT: Distance walked: 20fx2 Assistive device utilized: Single point cane Level of assistance: Complete Independence Comments: antalgic gait   TODAY'S TREATMENT:    [facility] Adult PT Treatment:                                                DATE: 08/12/22 Therapeutic Exercise: Nustep L4 8 min Seated hamstring stretch 30s x2  Supine QL stretch 30s x2 Bil Piriformis stretch cross body 30s x2 Bil R hip flexor stretch 30s x2 (bolster) Supine hip fallouts GTB 15x R clams 15x GTB    [facility] Adult PT Treatment:  DATE: 08/10/22 Therapeutic Exercise: Nustep L5 8 min Seated hamstring stretch 30s x2  Supine QL stretch 30s x2 Bil Piriformis stretch cross body 30s x2 Bil R hip flexor stretch 30s x2 (bolster) Supine hip fallouts R GTB 15x2 (bolster against LLE) Bridge with 5# ball 15x2 R clams 15x2 GTB R abduction in S/L with PT assist to minimize hip hike 15x2 R SLR 15x(painful)  [facility] Adult PT Treatment:                                                DATE: 08/05/22 Therapeutic Exercise: Nustep L4 8 min Seated hamstring stretch 30s x2  Supine QL stretch 30s x2 Bil R hip flexor stretch 30s x2 (bolster) Supine hip fallouts R GTB 15x2 (bolster against LLE) Bridge with 5# ball 15x2 R clams 15x2 GTB R abduction in S/L with PT assist to minimize hip hike 15x2 R SLR 2# 15x(painful) R piriformis stretch cross body 30s x2 Bil        PATIENT EDUCATION:  Education details: Discussed eval findings, rehab rationale and POC and  patient is in agreement  Person educated: Patient Education method: Explanation Education comprehension: verbalized understanding and needs further education   HOME EXERCISE PROGRAM: Access Code: [email or web address] URL: [email or web address] Date: 07/29/2022 Prepared by: Sharlynn Oliphant  Exercises - Supine Bridge  - 2 x daily - 5 x weekly - 1 sets - 15 reps - Clamshell  - 2 x daily - 5 x weekly - 1 sets - 15 reps - Supine Figure 4 Piriformis Stretch  - 2 x daily - 5 x weekly - 1 sets - 2 reps - 30s hold - Supine Quadratus Lumborum Stretch  - 2 x daily - 5 x weekly - 1 sets - 2 reps - 30s hold   ASSESSMENT:   CLINICAL IMPRESSION: No marked changes in pain levels or lumbar extension ROM, all symptoms appear to originate with R hip stressing in weightbearing positions as well a trying to lift R leg in a seated position.  At this time patient is not responding to PT and is agreeable to DC to pursue other interventions    OBJECTIVE IMPAIRMENTS: .Abnormal gait, decreased activity tolerance, decreased knowledge of condition, decreased knowledge of use of DME, decreased mobility, difficulty walking, decreased strength, increased fascial restrictions, increased muscle spasms, impaired flexibility, and pain    ACTIVITY LIMITATIONS: carrying, lifting, bending, standing, stairs, and bed mobility   PERSONAL FACTORS: Age, Fitness, Past/current experiences, Time since onset of injury/illness/exacerbation, and 1-2 comorbidities: HNP and R hip OA  are also affecting patient's functional outcome.    REHAB POTENTIAL: Fair based on chronicity and unsuccessful conservative treatment to date   CLINICAL DECISION MAKING: Evolving/moderate complexity   EVALUATION COMPLEXITY: Low     GOALS: Goals reviewed with patient? No   SHORT TERM GOALS: Target date: 08/03/2022   Patient to demonstrate independence in HEP  Baseline: [id number] Goal status: Met   2.  Increase deficit lumbar AROM to  50% Baseline:  AROM eval 08/03/22  Flexion 75% 75%  Extension 25% 25%  Right lateral flexion 25% 75%  Left lateral flexion 25% 75%  Right rotation 75% 75%  Left rotation 75% 75%   Goal status: Ongoing       LONG TERM GOALS: Target date: 08/24/2022   Increase trunk and core strength to 3+/5 Baseline: 3/5 trunk strength;  08/12/22 3+/5 Goal status: Met   2.  Decrease worst pain to 6/10 Baseline: 8/10 Goal status: Not met   3.  Increase lumbar SB to 75% B Baseline:  AROM eval 08/03/22  Flexion 75% 75%  Extension 25% 25%  Right lateral flexion 25% 75%  Left lateral flexion 25% 75%  Right rotation 75% 75%  Left rotation 75% 75%   Goal status: Met   4.  Increase 30s chair stand score to 5 arms crossed Baseline: 1 rep arms crossed Goal status: INITIAL       PLAN:   PT FREQUENCY: 2x/week   PT DURATION: 6 weeks   PLANNED INTERVENTIONS: Therapeutic exercises, Therapeutic activity, Neuromuscular re-education, Balance training, Gait training, Patient/Family education, Self Care, Joint mobilization, Stair training, Aquatic Therapy, Manual therapy, and Re-evaluation.   PLAN FOR NEXT SESSION: DC to HEP   Lanice Shirts, PT 08/12/2022, 9:53 AM

## 2022-08-17 ENCOUNTER — Telehealth: Payer: Self-pay | Admitting: Family Medicine

## 2022-08-17 ENCOUNTER — Ambulatory Visit: Payer: Medicare Other

## 2022-08-17 DIAGNOSIS — R21 Rash and other nonspecific skin eruption: Secondary | ICD-10-CM

## 2022-08-17 NOTE — Telephone Encounter (Signed)
Patient called in and would like a referral to a dermatologist. He stated that at his last visit ,it was discussed ,however he wanted to try and clear the issue up, on his own,but now he's thinking he needs to go see one.

## 2022-08-18 NOTE — Telephone Encounter (Signed)
New referral placed to dermatologist plz notify pt.

## 2022-08-18 NOTE — Addendum Note (Signed)
Addended by: Ria Bush on: 08/18/2022 11:02 AM   Modules accepted: Orders

## 2022-08-18 NOTE — Telephone Encounter (Signed)
Spoke with pt relaying Dr. G's message.  Pt expresses his thanks.  ?

## 2022-08-19 ENCOUNTER — Ambulatory Visit: Payer: Medicare Other

## 2022-08-24 ENCOUNTER — Ambulatory Visit: Payer: Medicare Other

## 2022-08-26 ENCOUNTER — Ambulatory Visit: Payer: Medicare Other

## 2022-08-29 DIAGNOSIS — M545 Low back pain, unspecified: Secondary | ICD-10-CM | POA: Diagnosis not present

## 2022-08-29 DIAGNOSIS — M542 Cervicalgia: Secondary | ICD-10-CM | POA: Diagnosis not present

## 2022-08-29 DIAGNOSIS — M9903 Segmental and somatic dysfunction of lumbar region: Secondary | ICD-10-CM | POA: Diagnosis not present

## 2022-08-29 DIAGNOSIS — M546 Pain in thoracic spine: Secondary | ICD-10-CM | POA: Diagnosis not present

## 2022-08-29 DIAGNOSIS — M9902 Segmental and somatic dysfunction of thoracic region: Secondary | ICD-10-CM | POA: Diagnosis not present

## 2022-08-29 DIAGNOSIS — M9901 Segmental and somatic dysfunction of cervical region: Secondary | ICD-10-CM | POA: Diagnosis not present

## 2022-08-31 ENCOUNTER — Ambulatory Visit: Payer: Medicare Other

## 2022-09-05 DIAGNOSIS — M545 Low back pain, unspecified: Secondary | ICD-10-CM | POA: Diagnosis not present

## 2022-09-05 DIAGNOSIS — M9901 Segmental and somatic dysfunction of cervical region: Secondary | ICD-10-CM | POA: Diagnosis not present

## 2022-09-05 DIAGNOSIS — M542 Cervicalgia: Secondary | ICD-10-CM | POA: Diagnosis not present

## 2022-09-05 DIAGNOSIS — M9902 Segmental and somatic dysfunction of thoracic region: Secondary | ICD-10-CM | POA: Diagnosis not present

## 2022-09-05 DIAGNOSIS — M546 Pain in thoracic spine: Secondary | ICD-10-CM | POA: Diagnosis not present

## 2022-09-05 DIAGNOSIS — M9903 Segmental and somatic dysfunction of lumbar region: Secondary | ICD-10-CM | POA: Diagnosis not present

## 2022-09-07 DIAGNOSIS — M546 Pain in thoracic spine: Secondary | ICD-10-CM | POA: Diagnosis not present

## 2022-09-07 DIAGNOSIS — M542 Cervicalgia: Secondary | ICD-10-CM | POA: Diagnosis not present

## 2022-09-07 DIAGNOSIS — M9903 Segmental and somatic dysfunction of lumbar region: Secondary | ICD-10-CM | POA: Diagnosis not present

## 2022-09-07 DIAGNOSIS — M9901 Segmental and somatic dysfunction of cervical region: Secondary | ICD-10-CM | POA: Diagnosis not present

## 2022-09-07 DIAGNOSIS — M545 Low back pain, unspecified: Secondary | ICD-10-CM | POA: Diagnosis not present

## 2022-09-07 DIAGNOSIS — M9902 Segmental and somatic dysfunction of thoracic region: Secondary | ICD-10-CM | POA: Diagnosis not present

## 2022-09-07 DIAGNOSIS — M62838 Other muscle spasm: Secondary | ICD-10-CM | POA: Diagnosis not present

## 2022-09-07 DIAGNOSIS — M62830 Muscle spasm of back: Secondary | ICD-10-CM | POA: Diagnosis not present

## 2022-09-08 ENCOUNTER — Encounter: Payer: Self-pay | Admitting: Physical Medicine & Rehabilitation

## 2022-09-08 ENCOUNTER — Encounter: Payer: Medicare Other | Attending: Physical Medicine and Rehabilitation | Admitting: Physical Medicine & Rehabilitation

## 2022-09-08 VITALS — BP 152/73 | HR 70 | Temp 98.2°F | Ht 69.0 in | Wt 184.6 lb

## 2022-09-08 DIAGNOSIS — M5416 Radiculopathy, lumbar region: Secondary | ICD-10-CM | POA: Diagnosis not present

## 2022-09-08 MED ORDER — IOHEXOL 180 MG/ML  SOLN
3.0000 mL | Freq: Once | INTRAMUSCULAR | Status: AC
  Administered 2022-09-08: 3 mL

## 2022-09-08 MED ORDER — LIDOCAINE HCL 1 % IJ SOLN
5.0000 mL | Freq: Once | INTRAMUSCULAR | Status: AC
  Administered 2022-09-08: 5 mL

## 2022-09-08 MED ORDER — LIDOCAINE HCL (PF) 1 % IJ SOLN
2.0000 mL | Freq: Once | INTRAMUSCULAR | Status: AC
  Administered 2022-09-08: 2 mL

## 2022-09-08 MED ORDER — DEXAMETHASONE SODIUM PHOSPHATE 10 MG/ML IJ SOLN
10.0000 mg | Freq: Once | INTRAMUSCULAR | Status: AC
  Administered 2022-09-08: 10 mg via INTRAVENOUS

## 2022-09-08 NOTE — Patient Instructions (Signed)

## 2022-09-08 NOTE — Progress Notes (Signed)
RIght L4-5 Lumbar transforaminal epidural steroid injection under fluoroscopic guidance with contrast enhancement  Indication: Lumbosacral radiculitis is not relieved by medication management or other conservative care and interfering with self-care and mobility.   Informed consent was obtained after describing risk and benefits of the procedure with the patient, this includes bleeding, bruising, infection, paralysis and medication side effects.  The patient wishes to proceed and has given written consent.  Patient was placed in prone position.  The lumbar area was marked and prepped with Betadine.  It was entered with a 25-gauge 1-1/2 inch needle and one mL of 1% lidocaine was injected into the skin and subcutaneous tissue.  Then a 22-gauge 3.5 in spinal needle was inserted into the RIght L4-5 intervertebral foramen under AP, lateral, and oblique view.  Once needle tip was within the foramen on lateral views an dnor exceeding 6 o clock position on th epedical on AP viewed Isovue 200 was inected x 85m Then a solution containing one mL of 10 mg per mL dexamethasone and 2 mL of 1% lidocaine was injected.  The patient tolerated procedure well.  Post procedure instructions were given.  Please see post procedure form.

## 2022-09-08 NOTE — Progress Notes (Signed)
  PROCEDURE RECORD [facility]   Name: Gregory Abbott DOB:[birth year] MRN: [id number]  Date:09/08/2022  Physician: Alysia Penna, MD    Nurse/CMA: Truman Hayward, CMA  Allergies:  Allergies  Allergen Reactions   Penicillins Other (See Comments)    Unknown - as child. ?asthma   Azithromycin Rash   Hydrochlorothiazide Itching and Rash    Generalized pruritus, worsened rash   Losartan Rash    Itchy diffuse rash throughout body    Consent Signed: Yes.    Is patient diabetic? Yes.    CBG today? 179..128 last night  Pregnant: No. LMP: No LMP for male patient. (age 18-55)  Anticoagulants: no Anti-inflammatory: no Antibiotics: no  Procedure: Transforaminal Epidural Steroid L4-L5  Position: Prone Start Time: 10:22 am  End Time: 10:30 am  Fluoro Time: 24  RN/CMA Truman Hayward, CMA Shasha Buchbinder, CMA    Time 10:05 am 10:35 am    BP 152/73 168/75    Pulse 70 75    Respirations 16 16    O2 Sat 98 97    S/S 6 6    Pain Level 5/10 1/10     D/C home with wife, patient A & O X 3, D/C instructions reviewed, and sits independently.

## 2022-09-10 DIAGNOSIS — M545 Low back pain, unspecified: Secondary | ICD-10-CM | POA: Diagnosis not present

## 2022-09-10 DIAGNOSIS — M542 Cervicalgia: Secondary | ICD-10-CM | POA: Diagnosis not present

## 2022-09-10 DIAGNOSIS — M9901 Segmental and somatic dysfunction of cervical region: Secondary | ICD-10-CM | POA: Diagnosis not present

## 2022-09-10 DIAGNOSIS — M62830 Muscle spasm of back: Secondary | ICD-10-CM | POA: Diagnosis not present

## 2022-09-10 DIAGNOSIS — M9903 Segmental and somatic dysfunction of lumbar region: Secondary | ICD-10-CM | POA: Diagnosis not present

## 2022-09-10 DIAGNOSIS — M546 Pain in thoracic spine: Secondary | ICD-10-CM | POA: Diagnosis not present

## 2022-09-10 DIAGNOSIS — M9902 Segmental and somatic dysfunction of thoracic region: Secondary | ICD-10-CM | POA: Diagnosis not present

## 2022-09-10 DIAGNOSIS — M62838 Other muscle spasm: Secondary | ICD-10-CM | POA: Diagnosis not present

## 2022-09-12 DIAGNOSIS — M546 Pain in thoracic spine: Secondary | ICD-10-CM | POA: Diagnosis not present

## 2022-09-12 DIAGNOSIS — M62830 Muscle spasm of back: Secondary | ICD-10-CM | POA: Diagnosis not present

## 2022-09-12 DIAGNOSIS — M9903 Segmental and somatic dysfunction of lumbar region: Secondary | ICD-10-CM | POA: Diagnosis not present

## 2022-09-12 DIAGNOSIS — M9902 Segmental and somatic dysfunction of thoracic region: Secondary | ICD-10-CM | POA: Diagnosis not present

## 2022-09-12 DIAGNOSIS — M545 Low back pain, unspecified: Secondary | ICD-10-CM | POA: Diagnosis not present

## 2022-09-12 DIAGNOSIS — M542 Cervicalgia: Secondary | ICD-10-CM | POA: Diagnosis not present

## 2022-09-12 DIAGNOSIS — M62838 Other muscle spasm: Secondary | ICD-10-CM | POA: Diagnosis not present

## 2022-09-12 DIAGNOSIS — M9901 Segmental and somatic dysfunction of cervical region: Secondary | ICD-10-CM | POA: Diagnosis not present

## 2022-09-14 DIAGNOSIS — M9903 Segmental and somatic dysfunction of lumbar region: Secondary | ICD-10-CM | POA: Diagnosis not present

## 2022-09-14 DIAGNOSIS — M546 Pain in thoracic spine: Secondary | ICD-10-CM | POA: Diagnosis not present

## 2022-09-14 DIAGNOSIS — M9902 Segmental and somatic dysfunction of thoracic region: Secondary | ICD-10-CM | POA: Diagnosis not present

## 2022-09-14 DIAGNOSIS — M9901 Segmental and somatic dysfunction of cervical region: Secondary | ICD-10-CM | POA: Diagnosis not present

## 2022-09-14 DIAGNOSIS — M62838 Other muscle spasm: Secondary | ICD-10-CM | POA: Diagnosis not present

## 2022-09-14 DIAGNOSIS — M545 Low back pain, unspecified: Secondary | ICD-10-CM | POA: Diagnosis not present

## 2022-09-14 DIAGNOSIS — M542 Cervicalgia: Secondary | ICD-10-CM | POA: Diagnosis not present

## 2022-09-14 DIAGNOSIS — M62830 Muscle spasm of back: Secondary | ICD-10-CM | POA: Diagnosis not present

## 2022-09-15 DIAGNOSIS — M546 Pain in thoracic spine: Secondary | ICD-10-CM | POA: Diagnosis not present

## 2022-09-15 DIAGNOSIS — M9901 Segmental and somatic dysfunction of cervical region: Secondary | ICD-10-CM | POA: Diagnosis not present

## 2022-09-15 DIAGNOSIS — M62830 Muscle spasm of back: Secondary | ICD-10-CM | POA: Diagnosis not present

## 2022-09-15 DIAGNOSIS — M9902 Segmental and somatic dysfunction of thoracic region: Secondary | ICD-10-CM | POA: Diagnosis not present

## 2022-09-15 DIAGNOSIS — M545 Low back pain, unspecified: Secondary | ICD-10-CM | POA: Diagnosis not present

## 2022-09-15 DIAGNOSIS — M542 Cervicalgia: Secondary | ICD-10-CM | POA: Diagnosis not present

## 2022-09-15 DIAGNOSIS — M62838 Other muscle spasm: Secondary | ICD-10-CM | POA: Diagnosis not present

## 2022-09-15 DIAGNOSIS — M9903 Segmental and somatic dysfunction of lumbar region: Secondary | ICD-10-CM | POA: Diagnosis not present

## 2022-09-19 DIAGNOSIS — M62838 Other muscle spasm: Secondary | ICD-10-CM | POA: Diagnosis not present

## 2022-09-19 DIAGNOSIS — M9903 Segmental and somatic dysfunction of lumbar region: Secondary | ICD-10-CM | POA: Diagnosis not present

## 2022-09-19 DIAGNOSIS — M9901 Segmental and somatic dysfunction of cervical region: Secondary | ICD-10-CM | POA: Diagnosis not present

## 2022-09-19 DIAGNOSIS — M545 Low back pain, unspecified: Secondary | ICD-10-CM | POA: Diagnosis not present

## 2022-09-19 DIAGNOSIS — M62830 Muscle spasm of back: Secondary | ICD-10-CM | POA: Diagnosis not present

## 2022-09-19 DIAGNOSIS — M542 Cervicalgia: Secondary | ICD-10-CM | POA: Diagnosis not present

## 2022-09-19 DIAGNOSIS — M9902 Segmental and somatic dysfunction of thoracic region: Secondary | ICD-10-CM | POA: Diagnosis not present

## 2022-09-19 DIAGNOSIS — M546 Pain in thoracic spine: Secondary | ICD-10-CM | POA: Diagnosis not present

## 2022-09-20 DIAGNOSIS — M62830 Muscle spasm of back: Secondary | ICD-10-CM | POA: Diagnosis not present

## 2022-09-20 DIAGNOSIS — M545 Low back pain, unspecified: Secondary | ICD-10-CM | POA: Diagnosis not present

## 2022-09-20 DIAGNOSIS — M62838 Other muscle spasm: Secondary | ICD-10-CM | POA: Diagnosis not present

## 2022-09-20 DIAGNOSIS — M542 Cervicalgia: Secondary | ICD-10-CM | POA: Diagnosis not present

## 2022-09-20 DIAGNOSIS — M9901 Segmental and somatic dysfunction of cervical region: Secondary | ICD-10-CM | POA: Diagnosis not present

## 2022-09-20 DIAGNOSIS — M9902 Segmental and somatic dysfunction of thoracic region: Secondary | ICD-10-CM | POA: Diagnosis not present

## 2022-09-20 DIAGNOSIS — M9903 Segmental and somatic dysfunction of lumbar region: Secondary | ICD-10-CM | POA: Diagnosis not present

## 2022-09-20 DIAGNOSIS — M546 Pain in thoracic spine: Secondary | ICD-10-CM | POA: Diagnosis not present

## 2022-09-21 ENCOUNTER — Encounter: Payer: Self-pay | Admitting: Family Medicine

## 2022-09-21 ENCOUNTER — Ambulatory Visit: Payer: Medicare Other | Admitting: Family Medicine

## 2022-09-21 VITALS — BP 144/78 | HR 79 | Temp 97.3°F | Ht 69.0 in | Wt 187.0 lb

## 2022-09-21 DIAGNOSIS — I10 Essential (primary) hypertension: Secondary | ICD-10-CM | POA: Diagnosis not present

## 2022-09-21 DIAGNOSIS — E1169 Type 2 diabetes mellitus with other specified complication: Secondary | ICD-10-CM | POA: Diagnosis not present

## 2022-09-21 DIAGNOSIS — M79604 Pain in right leg: Secondary | ICD-10-CM | POA: Diagnosis not present

## 2022-09-21 DIAGNOSIS — R21 Rash and other nonspecific skin eruption: Secondary | ICD-10-CM

## 2022-09-21 DIAGNOSIS — M1611 Unilateral primary osteoarthritis, right hip: Secondary | ICD-10-CM

## 2022-09-21 LAB — POCT GLYCOSYLATED HEMOGLOBIN (HGB A1C): Hemoglobin A1C: 6.5 % — AB (ref 4.0–5.6)

## 2022-09-21 MED ORDER — METFORMIN HCL ER 500 MG PO TB24: 500.0000 mg | ORAL_TABLET | Freq: Every day | ORAL | 6 refills | Status: DC

## 2022-09-21 NOTE — Progress Notes (Unsigned)
Patient ID: Gregory Abbott, male    DOB: 08/21/1945, 77 y.o.   MRN: [id number]  This visit was conducted in person.  BP (!) 144/78 (BP Location: Right Arm, Cuff Size: Normal)   Pulse 79   Temp (!) 97.3 F (36.3 C) (Temporal)   Ht 5\' 9"  (1.753 m)   Wt 187 lb (84.8 kg)   SpO2 96%   BMI 27.62 kg/m   BP Readings from Last 3 Encounters:  09/21/22 (!) 144/78  09/08/22 (!) 152/73  08/01/22 134/67    CC: 2 mo DM f/u visit  Subjective:   HPI: Gregory Abbott is a 77 y.o. male presenting on 09/21/2022 for Medical Management of Chronic Issues (Here for 2 mo DM f/u. Pt accompanied by wife, [name]. )   Chronic R leg pain along with lower back pain. Lumbar MRI with diffuse DDD and possible L L4 nerve root impingement along with mod lumbar stenosis due to spondylosis and facet arthropathy. Also with known osteoarthritis of R hip s/p ortho eval 2023. Had PT course Jan to Feb 2024 with some benefit. Tramadol didn't really help. Saw physiatrist Dr Tressa Busman and Kirsteins s/p R TF ESI L4/5 2 wks ago - no real benefit noted. Gabapentin dose increased to 300mg  BID - continues this.   He's started seeing chiropractor - doing 3 month program with daily home exercises. He is hopeful this will help.   Skin rash - pending appt with [place] derm for 07/2023- he cancelled this. Stopping HCTZ helped but itchy rash persists. H/o rash to losartan as well. Treating with TCI cream with benefit, also using peppermint and coconut oil. [name]   HTN - on amlodipine 5mg  daily, atenolol 100mg  daily. BP staying persistently elevated. No HA, vision changes, CP/tightness, SOB, leg swelling. Home BP yesterday [id number] systolic.   DM - does regularly check sugars and brings log: fasting 120-160, 120-160. Compliant with antihyperglycemic regimen which includes: metformin 500mg  bid - he stopped taking this 08/11/2022. Wanted to see effect on skin rash/itch and GI upset. GI upset improved but skin issue did not. Denies low sugars or hypoglycemic  symptoms. Denies paresthesias, blurry vision. Last diabetic eye exam 09/2021 - upcoming scheduled. Glucometer brand: Accuchek. Last foot exam: 01/2022. DSME: declined. Lab Results  Component Value Date   HGBA1C 6.5 (A) 09/21/2022   Diabetic Foot Exam - Simple   No data filed    Lab Results  Component Value Date   MICROALBUR 0.8 10/15/2021         Relevant past medical, surgical, family and social history reviewed and updated as indicated. Interim medical history since our last visit reviewed. Allergies and medications reviewed and updated. Outpatient Medications Prior to Visit  Medication Sig Dispense Refill   Accu-Chek FastClix Lancets MISC E11.69 Use as directed to check sugars daily 100 each 1   Accu-Chek Softclix Lancets lancets Use as instructed to check blood sugar 2 times a day 200 each 3   albuterol (VENTOLIN HFA) 108 (90 Base) MCG/ACT inhaler TAKE 2 PUFFS BY MOUTH EVERY 6 HOURS AS NEEDED FOR WHEEZE OR SHORTNESS OF BREATH 8.5 each 0   amLODipine (NORVASC) 10 MG tablet Take 0.5 tablets (5 mg total) by mouth daily.     aspirin EC 81 MG tablet Take 81 mg by mouth every other day.     atenolol (TENORMIN) 100 MG tablet TAKE 1 TABLET (100 MG TOTAL) BY MOUTH DAILY AFTER BREAKFAST. 90 tablet 3   Blood Glucose Monitoring Suppl (ACCU-CHEK GUIDE)  w/Device KIT Use as directed to check sugars daily 1 kit 0   Cholecalciferol (VITAMIN D3) 25 MCG (1000 UT) CAPS Take 1 capsule (1,000 Units total) by mouth daily. (Patient taking differently: Take 1 capsule by mouth daily. Takes about twice a month) 30 capsule    gabapentin (NEURONTIN) 300 MG capsule Take 1 capsule (300 mg total) by mouth 2 (two) times daily. 60 capsule 5   glucose blood (ACCU-CHEK GUIDE) test strip 1 each by Other route 2 (two) times daily as needed for other. E11.65. Use as instructed 200 each 3   Insulin Pen Needle 31G X 5 MM MISC Use for daily insulin injection 100 each 1   loratadine (CLARITIN) 10 MG tablet Take 1 tablet (10  mg total) by mouth daily. (Patient taking differently: Take 10 mg by mouth daily. As needed)     omeprazole (PRILOSEC) 40 MG capsule TAKE 1 CAPSULE (40 MG TOTAL) BY MOUTH DAILY. 90 capsule 3   rosuvastatin (CRESTOR) 5 MG tablet Take 1 tablet (5 mg total) by mouth daily. 90 tablet 3   sildenafil (REVATIO) 20 MG tablet TAKE 2-5 TABLETS BY MOUTH EACH DAY AS NEEDED (RELATIONS) 30 tablet 3   traMADol (ULTRAM) 50 MG tablet Take 1 tablet (50 mg total) by mouth 2 (two) times daily as needed for moderate pain (sedation precautions). 30 tablet 0   triamcinolone cream (KENALOG) 0.1 % APPLY TOPICALLY TWICE A DAY 60 g 1   TURMERIC PO Take by mouth daily.     metFORMIN (GLUCOPHAGE) 1000 MG tablet Take 0.5 tablets (500 mg total) by mouth 2 (two) times daily. (Patient not taking: Reported on 09/21/2022)     No facility-administered medications prior to visit.     Per HPI unless specifically indicated in ROS section below Review of Systems  Objective:  BP (!) 144/78 (BP Location: Right Arm, Cuff Size: Normal)   Pulse 79   Temp (!) 97.3 F (36.3 C) (Temporal)   Ht 5\' 9"  (1.753 m)   Wt 187 lb (84.8 kg)   SpO2 96%   BMI 27.62 kg/m   Wt Readings from Last 3 Encounters:  09/21/22 187 lb (84.8 kg)  09/08/22 184 lb 9.6 oz (83.7 kg)  08/01/22 181 lb (82.1 kg)      Physical Exam Vitals and nursing note reviewed.  Constitutional:      Appearance: Normal appearance. He is not ill-appearing.     Comments: Antalgic gait due to R leg pain - ambulates with cane  Eyes:     Extraocular Movements: Extraocular movements intact.     Conjunctiva/sclera: Conjunctivae normal.     Pupils: Pupils are equal, round, and reactive to light.  Cardiovascular:     Rate and Rhythm: Normal rate and regular rhythm.     Pulses: Normal pulses.     Heart sounds: Normal heart sounds. No murmur heard. Pulmonary:     Effort: Pulmonary effort is normal. No respiratory distress.     Breath sounds: Normal breath sounds. No  wheezing, rhonchi or rales.  Musculoskeletal:     Right lower leg: No edema.     Left lower leg: No edema.     Comments: See HPI for foot exam if done  Skin:    General: Skin is warm and dry.     Findings: No rash.  Neurological:     Mental Status: He is alert.  Psychiatric:        Mood and Affect: Mood normal.  Behavior: Behavior normal.       Results for orders placed or performed in visit on 09/21/22  POCT glycosylated hemoglobin (Hb A1C)  Result Value Ref Range   Hemoglobin A1C 6.5 (A) 4.0 - 5.6 %   HbA1c POC (<> result, manual entry)     HbA1c, POC (prediabetic range)     HbA1c, POC (controlled diabetic range)     Lab Results  Component Value Date   TSH 1.90 06/22/2022   Assessment & Plan:   Problem List Items Addressed This Visit     Hypertension    Chronic, stable. BP improved on repeat. Home readings largely well controlled. He will continue amlodipine 5mg  daily and atenolol 100mg  daily.       Skin rash    Continued itchy rash, some improvement noted off hctz and ARB.  Unable to get into derm until 07/2023.  Continues TCI cream with peppermint and coconut oil with benefit.  Consider hydroxyzine trial.       Type 2 diabetes mellitus with other specified complication ([place]) - Primary    He's slowly tapered off all antihyperglycemics, most recently metformin due to GI upset.  He continues closely monitoring cbg at home with overall stable readings, latest A1c 6.5% showing good control. Agrees to try metformin XR 500mg  - sent to pharmacy.  Due for diabetic eye exam - will call to schedule      Relevant Medications   metFORMIN (GLUCOPHAGE-XR) 500 MG 24 hr tablet   Other Relevant Orders   POCT glycosylated hemoglobin (Hb A1C) (Completed)   Right leg pain    Thought multifactorial including severe R hip OA, known pseudogout and likely lumbar radiculopathy component.  Treatments today without significant relief.  Discussed ongoing concern most pain is  stemming from R hip osteoarthritis - has discussed possible dx/tx injection into R hip through PM&R - encouraged he proceed with this - he will keep upcoming PM&R appt.  For the time being he will continue seeing chiropractor as well.       Osteoarthritis of right hip     Meds ordered this encounter  Medications   metFORMIN (GLUCOPHAGE-XR) 500 MG 24 hr tablet    Sig: Take 1 tablet (500 mg total) by mouth daily with breakfast.    Dispense:  30 tablet    Refill:  6    Orders Placed This Encounter  Procedures   POCT glycosylated hemoglobin (Hb A1C)    Patient Instructions  Good to see you today.  Continue to monitor sugars and let us know if they start trending up. Goal fasting sugar 80-120, goal 2 hours after meals <180. I'll send in metformin XR extended release 1 tablet daily.  Keep an eye on blood pressure, let me know if consistently >150/90. Bring your BP cuff to next visit to compare to our readings.  Check on diabetic eye exam as you're about due.  Return in 3 months for physical.   Follow up plan: Return in about 3 months (around 12/22/2022), or if symptoms worsen or fail to improve, for medicare wellness visit.  Ria Bush, MD

## 2022-09-21 NOTE — Patient Instructions (Addendum)
Good to see you today.  Continue to monitor sugars and let us know if they start trending up. Goal fasting sugar 80-120, goal 2 hours after meals <180. I'll send in metformin XR extended release 1 tablet daily.  Keep an eye on blood pressure, let me know if consistently >150/90. Bring your BP cuff to next visit to compare to our readings.  Check on diabetic eye exam as you're about due.  Return in 3 months for physical.

## 2022-09-22 DIAGNOSIS — M542 Cervicalgia: Secondary | ICD-10-CM | POA: Diagnosis not present

## 2022-09-22 DIAGNOSIS — M62838 Other muscle spasm: Secondary | ICD-10-CM | POA: Diagnosis not present

## 2022-09-22 DIAGNOSIS — M546 Pain in thoracic spine: Secondary | ICD-10-CM | POA: Diagnosis not present

## 2022-09-22 DIAGNOSIS — M545 Low back pain, unspecified: Secondary | ICD-10-CM | POA: Diagnosis not present

## 2022-09-22 DIAGNOSIS — M9901 Segmental and somatic dysfunction of cervical region: Secondary | ICD-10-CM | POA: Diagnosis not present

## 2022-09-22 DIAGNOSIS — M62830 Muscle spasm of back: Secondary | ICD-10-CM | POA: Diagnosis not present

## 2022-09-22 DIAGNOSIS — M9903 Segmental and somatic dysfunction of lumbar region: Secondary | ICD-10-CM | POA: Diagnosis not present

## 2022-09-22 DIAGNOSIS — M9902 Segmental and somatic dysfunction of thoracic region: Secondary | ICD-10-CM | POA: Diagnosis not present

## 2022-09-22 NOTE — Assessment & Plan Note (Addendum)
Chronic, stable. BP improved on repeat. Home readings largely well controlled. He will continue amlodipine 5mg  daily and atenolol 100mg  daily.

## 2022-09-22 NOTE — Assessment & Plan Note (Signed)
Continued itchy rash, some improvement noted off hctz and ARB.  Unable to get into derm until 07/2023.  Continues TCI cream with peppermint and coconut oil with benefit.  Consider hydroxyzine trial.

## 2022-09-22 NOTE — Assessment & Plan Note (Signed)
Thought multifactorial including severe R hip OA, known pseudogout and likely lumbar radiculopathy component.  Treatments today without significant relief.  Discussed ongoing concern most pain is stemming from R hip osteoarthritis - has discussed possible dx/tx injection into R hip through PM&R - encouraged he proceed with this - he will keep upcoming PM&R appt.  For the time being he will continue seeing chiropractor as well.

## 2022-09-22 NOTE — Assessment & Plan Note (Addendum)
He's slowly tapered off all antihyperglycemics, most recently metformin due to GI upset.  He continues closely monitoring cbg at home with overall stable readings, latest A1c 6.5% showing good control. Agrees to try metformin XR 500mg  - sent to pharmacy.  Due for diabetic eye exam - will call to schedule

## 2022-09-23 ENCOUNTER — Telehealth: Payer: Self-pay | Admitting: *Deleted

## 2022-09-23 NOTE — Telephone Encounter (Signed)
-----   Message from Brenton Grills, [place] sent at 09/21/2022 10:07 AM EDT ----- Regarding: Dermatology Referral Good morning. Pt is in office today for follow up visit and was asking about his dermatology referral. He states he was sent a notification in MyChart for a visit scheduled in 2025 but said that was too far out for him and canceled the appointment.   Now pt is wanting to reschedule for an office in [place] but I don't see any information in current referral to give him to reschedule. Please either reschedule pt or resend contact info for pt to call and reschedule. Thanks in advance.   Glorianne Manchester, CMA

## 2022-09-23 NOTE — Telephone Encounter (Signed)
We can send the referral to [facility] - they have two locations ([place]) - they are booking as early as June. Unfortunately there are not many locations that are booking sooner unless they have a cancellation.   Mychart message sent to patient with information.

## 2022-09-26 DIAGNOSIS — M545 Low back pain, unspecified: Secondary | ICD-10-CM | POA: Diagnosis not present

## 2022-09-26 DIAGNOSIS — M62838 Other muscle spasm: Secondary | ICD-10-CM | POA: Diagnosis not present

## 2022-09-26 DIAGNOSIS — M9903 Segmental and somatic dysfunction of lumbar region: Secondary | ICD-10-CM | POA: Diagnosis not present

## 2022-09-26 DIAGNOSIS — M546 Pain in thoracic spine: Secondary | ICD-10-CM | POA: Diagnosis not present

## 2022-09-26 DIAGNOSIS — M9901 Segmental and somatic dysfunction of cervical region: Secondary | ICD-10-CM | POA: Diagnosis not present

## 2022-09-26 DIAGNOSIS — M542 Cervicalgia: Secondary | ICD-10-CM | POA: Diagnosis not present

## 2022-09-26 DIAGNOSIS — M9902 Segmental and somatic dysfunction of thoracic region: Secondary | ICD-10-CM | POA: Diagnosis not present

## 2022-09-26 DIAGNOSIS — M62830 Muscle spasm of back: Secondary | ICD-10-CM | POA: Diagnosis not present

## 2022-09-28 DIAGNOSIS — M545 Low back pain, unspecified: Secondary | ICD-10-CM | POA: Diagnosis not present

## 2022-09-28 DIAGNOSIS — M9902 Segmental and somatic dysfunction of thoracic region: Secondary | ICD-10-CM | POA: Diagnosis not present

## 2022-09-28 DIAGNOSIS — M62830 Muscle spasm of back: Secondary | ICD-10-CM | POA: Diagnosis not present

## 2022-09-28 DIAGNOSIS — M9901 Segmental and somatic dysfunction of cervical region: Secondary | ICD-10-CM | POA: Diagnosis not present

## 2022-09-28 DIAGNOSIS — M62838 Other muscle spasm: Secondary | ICD-10-CM | POA: Diagnosis not present

## 2022-09-28 DIAGNOSIS — M546 Pain in thoracic spine: Secondary | ICD-10-CM | POA: Diagnosis not present

## 2022-09-28 DIAGNOSIS — M9903 Segmental and somatic dysfunction of lumbar region: Secondary | ICD-10-CM | POA: Diagnosis not present

## 2022-09-28 DIAGNOSIS — M542 Cervicalgia: Secondary | ICD-10-CM | POA: Diagnosis not present

## 2022-09-29 DIAGNOSIS — M9901 Segmental and somatic dysfunction of cervical region: Secondary | ICD-10-CM | POA: Diagnosis not present

## 2022-09-29 DIAGNOSIS — M545 Low back pain, unspecified: Secondary | ICD-10-CM | POA: Diagnosis not present

## 2022-09-29 DIAGNOSIS — M62830 Muscle spasm of back: Secondary | ICD-10-CM | POA: Diagnosis not present

## 2022-09-29 DIAGNOSIS — M9902 Segmental and somatic dysfunction of thoracic region: Secondary | ICD-10-CM | POA: Diagnosis not present

## 2022-09-29 DIAGNOSIS — M62838 Other muscle spasm: Secondary | ICD-10-CM | POA: Diagnosis not present

## 2022-09-29 DIAGNOSIS — M546 Pain in thoracic spine: Secondary | ICD-10-CM | POA: Diagnosis not present

## 2022-09-29 DIAGNOSIS — M542 Cervicalgia: Secondary | ICD-10-CM | POA: Diagnosis not present

## 2022-09-29 DIAGNOSIS — M9903 Segmental and somatic dysfunction of lumbar region: Secondary | ICD-10-CM | POA: Diagnosis not present

## 2022-10-03 DIAGNOSIS — M545 Low back pain, unspecified: Secondary | ICD-10-CM | POA: Diagnosis not present

## 2022-10-03 DIAGNOSIS — M546 Pain in thoracic spine: Secondary | ICD-10-CM | POA: Diagnosis not present

## 2022-10-03 DIAGNOSIS — M542 Cervicalgia: Secondary | ICD-10-CM | POA: Diagnosis not present

## 2022-10-03 DIAGNOSIS — M9901 Segmental and somatic dysfunction of cervical region: Secondary | ICD-10-CM | POA: Diagnosis not present

## 2022-10-03 DIAGNOSIS — M9903 Segmental and somatic dysfunction of lumbar region: Secondary | ICD-10-CM | POA: Diagnosis not present

## 2022-10-03 DIAGNOSIS — M9902 Segmental and somatic dysfunction of thoracic region: Secondary | ICD-10-CM | POA: Diagnosis not present

## 2022-10-03 DIAGNOSIS — M62838 Other muscle spasm: Secondary | ICD-10-CM | POA: Diagnosis not present

## 2022-10-03 DIAGNOSIS — M62830 Muscle spasm of back: Secondary | ICD-10-CM | POA: Diagnosis not present

## 2022-10-05 ENCOUNTER — Other Ambulatory Visit: Payer: Self-pay | Admitting: Family Medicine

## 2022-10-05 DIAGNOSIS — M9901 Segmental and somatic dysfunction of cervical region: Secondary | ICD-10-CM | POA: Diagnosis not present

## 2022-10-05 DIAGNOSIS — M546 Pain in thoracic spine: Secondary | ICD-10-CM | POA: Diagnosis not present

## 2022-10-05 DIAGNOSIS — M9902 Segmental and somatic dysfunction of thoracic region: Secondary | ICD-10-CM | POA: Diagnosis not present

## 2022-10-05 DIAGNOSIS — M542 Cervicalgia: Secondary | ICD-10-CM | POA: Diagnosis not present

## 2022-10-05 DIAGNOSIS — M9903 Segmental and somatic dysfunction of lumbar region: Secondary | ICD-10-CM | POA: Diagnosis not present

## 2022-10-05 DIAGNOSIS — M545 Low back pain, unspecified: Secondary | ICD-10-CM | POA: Diagnosis not present

## 2022-10-06 DIAGNOSIS — M9902 Segmental and somatic dysfunction of thoracic region: Secondary | ICD-10-CM | POA: Diagnosis not present

## 2022-10-06 DIAGNOSIS — M9901 Segmental and somatic dysfunction of cervical region: Secondary | ICD-10-CM | POA: Diagnosis not present

## 2022-10-06 DIAGNOSIS — M546 Pain in thoracic spine: Secondary | ICD-10-CM | POA: Diagnosis not present

## 2022-10-06 DIAGNOSIS — M9903 Segmental and somatic dysfunction of lumbar region: Secondary | ICD-10-CM | POA: Diagnosis not present

## 2022-10-06 DIAGNOSIS — M545 Low back pain, unspecified: Secondary | ICD-10-CM | POA: Diagnosis not present

## 2022-10-06 DIAGNOSIS — M542 Cervicalgia: Secondary | ICD-10-CM | POA: Diagnosis not present

## 2022-10-10 DIAGNOSIS — M545 Low back pain, unspecified: Secondary | ICD-10-CM | POA: Diagnosis not present

## 2022-10-10 DIAGNOSIS — M542 Cervicalgia: Secondary | ICD-10-CM | POA: Diagnosis not present

## 2022-10-10 DIAGNOSIS — M9902 Segmental and somatic dysfunction of thoracic region: Secondary | ICD-10-CM | POA: Diagnosis not present

## 2022-10-10 DIAGNOSIS — M9901 Segmental and somatic dysfunction of cervical region: Secondary | ICD-10-CM | POA: Diagnosis not present

## 2022-10-10 DIAGNOSIS — M9903 Segmental and somatic dysfunction of lumbar region: Secondary | ICD-10-CM | POA: Diagnosis not present

## 2022-10-10 DIAGNOSIS — M546 Pain in thoracic spine: Secondary | ICD-10-CM | POA: Diagnosis not present

## 2022-10-12 DIAGNOSIS — M545 Low back pain, unspecified: Secondary | ICD-10-CM | POA: Diagnosis not present

## 2022-10-12 DIAGNOSIS — M542 Cervicalgia: Secondary | ICD-10-CM | POA: Diagnosis not present

## 2022-10-12 DIAGNOSIS — M9902 Segmental and somatic dysfunction of thoracic region: Secondary | ICD-10-CM | POA: Diagnosis not present

## 2022-10-12 DIAGNOSIS — M9903 Segmental and somatic dysfunction of lumbar region: Secondary | ICD-10-CM | POA: Diagnosis not present

## 2022-10-12 DIAGNOSIS — M9901 Segmental and somatic dysfunction of cervical region: Secondary | ICD-10-CM | POA: Diagnosis not present

## 2022-10-12 DIAGNOSIS — M546 Pain in thoracic spine: Secondary | ICD-10-CM | POA: Diagnosis not present

## 2022-10-13 DIAGNOSIS — M9902 Segmental and somatic dysfunction of thoracic region: Secondary | ICD-10-CM | POA: Diagnosis not present

## 2022-10-13 DIAGNOSIS — M545 Low back pain, unspecified: Secondary | ICD-10-CM | POA: Diagnosis not present

## 2022-10-13 DIAGNOSIS — M9901 Segmental and somatic dysfunction of cervical region: Secondary | ICD-10-CM | POA: Diagnosis not present

## 2022-10-13 DIAGNOSIS — M542 Cervicalgia: Secondary | ICD-10-CM | POA: Diagnosis not present

## 2022-10-13 DIAGNOSIS — M546 Pain in thoracic spine: Secondary | ICD-10-CM | POA: Diagnosis not present

## 2022-10-13 DIAGNOSIS — M9903 Segmental and somatic dysfunction of lumbar region: Secondary | ICD-10-CM | POA: Diagnosis not present

## 2022-10-14 DIAGNOSIS — T8522XA Displacement of intraocular lens, initial encounter: Secondary | ICD-10-CM | POA: Diagnosis not present

## 2022-10-14 DIAGNOSIS — E119 Type 2 diabetes mellitus without complications: Secondary | ICD-10-CM | POA: Diagnosis not present

## 2022-10-14 DIAGNOSIS — Z961 Presence of intraocular lens: Secondary | ICD-10-CM | POA: Diagnosis not present

## 2022-10-14 LAB — HM DIABETES EYE EXAM

## 2022-10-24 ENCOUNTER — Ambulatory Visit (INDEPENDENT_AMBULATORY_CARE_PROVIDER_SITE_OTHER): Payer: Medicare Other

## 2022-10-24 VITALS — Ht 71.0 in | Wt 190.0 lb

## 2022-10-24 DIAGNOSIS — Z0000 Encounter for general adult medical examination without abnormal findings: Secondary | ICD-10-CM

## 2022-10-24 NOTE — Progress Notes (Signed)
I connected with  Dionne Milo on 10/24/22 by a audio enabled telemedicine application and verified that I am speaking with the correct person using two identifiers.  Patient Location: Home  Provider Location: Home Office  I discussed the limitations of evaluation and management by telemedicine. The patient expressed understanding and agreed to proceed.  Subjective:   LUCHIANO VISCOMI is a 77 y.o. male who presents for Medicare Annual/Subsequent preventive examination.  Review of Systems      Cardiac Risk Factors include: advanced age (>55men, >37 women);hypertension;male gender;diabetes mellitus     Objective:    Today's Vitals   10/24/22 1103  Weight: 190 lb (86.2 kg)  Height: 5\' 11"  (1.803 m)   Body mass index is 26.5 kg/m.     10/24/2022   11:12 AM 10/18/2021   11:25 AM 10/13/2020   10:30 AM 06/20/2019    2:45 PM 06/15/2018    2:08 PM 05/02/2016    9:40 AM 11/07/2013    4:50 AM  Advanced Directives  Does Patient Have a Medical Advance Directive? Yes Yes Yes Yes Yes Yes Patient does not have advance directive  Type of Advance Directive Healthcare Power of [place];Living will Healthcare Power of [place];Living will Healthcare Power of [place];Living will Healthcare Power of [place];Living will Healthcare Power of [place];Living will Healthcare Power of [place];Living will   Does patient want to make changes to medical advance directive?      No - Patient declined   Copy of Healthcare Power of Attorney in Chart? No - copy requested No - copy requested No - copy requested No - copy requested No - copy requested No - copy requested   Pre-existing out of facility DNR order (yellow form or pink MOST form)       No    Current Medications (verified) Outpatient Encounter Medications as of 10/24/2022  Medication Sig   Accu-Chek FastClix Lancets MISC E11.69 Use as directed to check sugars daily   Accu-Chek Softclix Lancets lancets Use as instructed to check blood sugar 2 times  a day   albuterol (VENTOLIN HFA) 108 (90 Base) MCG/ACT inhaler INHALE 2 PUFFS BY MOUTH EVERY 6 HOURS AS NEEDED FOR WHEEZE OR SHORTNESS OF BREATH   amLODipine (NORVASC) 10 MG tablet Take 0.5 tablets (5 mg total) by mouth daily.   aspirin EC 81 MG tablet Take 81 mg by mouth every other day.   atenolol (TENORMIN) 100 MG tablet TAKE 1 TABLET (100 MG TOTAL) BY MOUTH DAILY AFTER BREAKFAST.   Blood Glucose Monitoring Suppl (ACCU-CHEK GUIDE) w/Device KIT Use as directed to check sugars daily   glucose blood (ACCU-CHEK GUIDE) test strip 1 each by Other route 2 (two) times daily as needed for other. E11.65. Use as instructed   Insulin Pen Needle 31G X 5 MM MISC Use for daily insulin injection   loratadine (CLARITIN) 10 MG tablet Take 1 tablet (10 mg total) by mouth daily. (Patient taking differently: Take 10 mg by mouth daily. As needed)   omeprazole (PRILOSEC) 40 MG capsule TAKE 1 CAPSULE (40 MG TOTAL) BY MOUTH DAILY.   rosuvastatin (CRESTOR) 5 MG tablet Take 1 tablet (5 mg total) by mouth daily.   sildenafil (REVATIO) 20 MG tablet TAKE 2-5 TABLETS BY MOUTH EACH DAY AS NEEDED (RELATIONS)   triamcinolone cream (KENALOG) 0.1 % APPLY TOPICALLY TWICE A DAY   TURMERIC PO Take by mouth daily.   Cholecalciferol (VITAMIN D3) 25 MCG (1000 UT) CAPS Take 1 capsule (1,000 Units total) by mouth daily. (  Patient not taking: Reported on 10/24/2022)   gabapentin (NEURONTIN) 300 MG capsule Take 1 capsule (300 mg total) by mouth 2 (two) times daily. (Patient not taking: Reported on 10/24/2022)   metFORMIN (GLUCOPHAGE-XR) 500 MG 24 hr tablet Take 1 tablet (500 mg total) by mouth daily with breakfast. (Patient not taking: Reported on 10/24/2022)   traMADol (ULTRAM) 50 MG tablet Take 1 tablet (50 mg total) by mouth 2 (two) times daily as needed for moderate pain (sedation precautions). (Patient not taking: Reported on 10/24/2022)   No facility-administered encounter medications on file as of 10/24/2022.    Allergies  (verified) Penicillins, Azithromycin, Hydrochlorothiazide, and Losartan   History: Past Medical History:  Diagnosis Date   Blood transfusion without reported diagnosis    BPH (benign prostatic hypertrophy)    Cataract    Childhood asthma    COPD (chronic obstructive pulmonary disease)    Eczema    Ex-smoker    40 PY hx   GERD (gastroesophageal reflux disease)    History of chicken pox    Hypertension    Lower back pain    h/o herniated and bulging discs lower back   Osteoarthritis of neck    Personal history of colonic polyps 05/07/2013   resected not retrieved   PUD (peptic ulcer disease)    S/P splenectomy    partial   Shortness of breath    Past Surgical History:  Procedure Laterality Date   AAA screen  04/2013   neg for AAA   CATARACT EXTRACTION Left 05/2013    Eye   CATARACT EXTRACTION W/PHACO Right 10/06/2015   Procedure: CATARACT EXTRACTION PHACO AND INTRAOCULAR LENS PLACEMENT (IOC);  Surgeon: Galen Manila, MD;  Location: [facility] ORS;  Service: Ophthalmology;  Laterality: Right;  [place] AP% CDE fluid pack lot 3 [id number] H exp 04/02/2017   COLONOSCOPY  2009   COLONOSCOPY  05/2013   1 polyp, int/ext hemorrhoids Leone Payor)   COLONOSCOPY  08/2018   Diminutive adenoma, no f/u planned Leone Payor)   KNEE ARTHROSCOPY     lower extremity arterial doppler  2013   WNL   SPLENECTOMY, PARTIAL  1998   fall down stairs   Family History  Problem Relation Age of Onset   Diabetes Mother    Hypertension Mother    Hyperlipidemia Mother    Heart disease Mother    Stroke Father    Colon cancer Neg Hx    Rectal cancer Neg Hx    Stomach cancer Neg Hx    Esophageal cancer Neg Hx    Social History   Socioeconomic History   Marital status: Married    Spouse name: Not on file   Number of children: 2   Years of education: Not on file   Highest education level: Not on file  Occupational History   Occupation: Retired  Tobacco Use   Smoking status: Former    Packs/day:  0.50    Years: 49.00    Additional pack years: 0.00    Total pack years: 24.50    Types: Cigarettes    Quit date: 11/06/2013    Years since quitting: 8.9   Smokeless tobacco: Never  Vaping Use   Vaping Use: Never used  Substance and Sexual Activity   Alcohol use: Not Currently    Alcohol/week: 2.0 standard drinks of alcohol    Types: 1 Cans of beer, 1 Shots of liquor per week    Comment: occasional   Drug use: No   Sexual activity: Yes  Other  Topics Concern   Not on file  Social History Narrative   Lives with wife, no pets   Occupation: retired, Education officer, environmental   Edu: Degree   Activity: housework, no regular exercise   Diet: good water, fruits/vegetables daily   Social Determinants of Health   Financial Resource Strain: Low Risk  (10/24/2022)   Overall Financial Resource Strain (CARDIA)    Difficulty of Paying Living Expenses: Not hard at all  Food Insecurity: No Food Insecurity (10/24/2022)   Hunger Vital Sign    Worried About Running Out of Food in the Last Year: Never true    Ran Out of Food in the Last Year: Never true  Transportation Needs: No Transportation Needs (10/24/2022)   PRAPARE - Administrator, Civil Service (Medical): No    Lack of Transportation (Non-Medical): No  Physical Activity: Sufficiently Active (10/24/2022)   Exercise Vital Sign    Days of Exercise per Week: 7 days    Minutes of Exercise per Session: 60 min  Stress: No Stress Concern Present (10/24/2022)   [employer] of Occupational Health - Occupational Stress Questionnaire    Feeling of Stress : Not at all  Social Connections: Moderately Integrated (10/24/2022)   Social Connection and Isolation Panel [NHANES]    Frequency of Communication with Friends and Family: More than three times a week    Frequency of Social Gatherings with Friends and Family: More than three times a week    Attends Religious Services: More than 4 times per year    Active Member of [employer] or Organizations: No     Attends Engineer, structural: Never    Marital Status: Married    Tobacco Counseling Counseling given: Not Answered   Clinical Intake:  Pre-visit preparation completed: Yes  Pain : No/denies pain     Nutritional Risks: None Diabetes: Yes CBG done?: Yes (128 per pt) CBG resulted in Enter/ Edit results?: No Did pt. bring in CBG monitor from home?: No  How often do you need to have someone help you when you read instructions, pamphlets, or other written materials from your doctor or pharmacy?: 1 - Never  Diabetic? Nutrition Risk Assessment:  Has the patient had any N/V/D within the last 2 months?  No  Does the patient have any non-healing wounds?  No  Has the patient had any unintentional weight loss or weight gain?  No   Diabetes:  Is the patient diabetic?  Yes  If diabetic, was a CBG obtained today?  Yes , 128 per pt Did the patient bring in their glucometer from home?  No  How often do you monitor your CBG's? BID.   Financial Strains and Diabetes Management:  Are you having any financial strains with the device, your supplies or your medication? No .  Does the patient want to be seen by Chronic Care Management for management of their diabetes?  No  Would the patient like to be referred to a Nutritionist or for Diabetic Management?  No   Diabetic Exams:  Diabetic Eye Exam: Completed 10/14/2022 [facility] Diabetic Foot Exam: Completed 01/14/2022 PCP    Interpreter Needed?: No  Information entered by :: C.Jermaine Neuharth LPN   Activities of Daily Living    10/24/2022   11:14 AM  In your present state of health, do you have any difficulty performing the following activities:  Hearing? 0  Vision? 0  Difficulty concentrating or making decisions? 0  Walking or climbing stairs? 0  Dressing or bathing?  0  Doing errands, shopping? 0  Preparing Food and eating ? N  Using the Toilet? N  In the past six months, have you accidently leaked urine? N  Do you have  problems with loss of bowel control? N  Managing your Medications? N  Managing your Finances? N  Housekeeping or managing your Housekeeping? N    Patient Care Team: Eustaquio Boyden, MD as PCP - General (Family Medicine) Galen Manila, MD as Referring Physician (Ophthalmology)  Indicate any recent Medical Services you may have received from other than [facility] providers in the past year (date may be approximate).     Assessment:   This is a routine wellness examination for [place].  Hearing/Vision screen Hearing Screening - Comments:: No aids Vision Screening - Comments:: Glasses - [facility]  Dietary issues and exercise activities discussed: Current Exercise Habits: Home exercise routine, Type of exercise: stretching, Time (Minutes): 60, Frequency (Times/Week): 7, Weekly Exercise (Minutes/Week): 420, Intensity: Mild, Exercise limited by: None identified (leg pain)   Goals Addressed             This Visit's Progress    Patient Stated       No new goals       Depression Screen    10/24/2022   11:11 AM 08/01/2022    1:18 PM 07/25/2022    9:59 AM 10/18/2021   11:22 AM 10/13/2020   10:33 AM 06/20/2019    2:47 PM 06/15/2018    8:20 AM  PHQ 2/9 Scores  PHQ - 2 Score 0 0 0 0 0 0 0  PHQ- 9 Score  0   0 0 0    Fall Risk    10/24/2022   11:06 AM 08/01/2022    1:18 PM 07/25/2022    9:58 AM 10/18/2021   11:26 AM 10/13/2020   10:33 AM  Fall Risk   Falls in the past year? 0 0 0 0 0  Number falls in past yr: 0 0  0 0  Injury with Fall? 0 0  0 0  Risk for fall due to : No Fall Risks   No Fall Risks Medication side effect  Follow up Falls prevention discussed;Falls evaluation completed;Education provided   Falls prevention discussed Falls evaluation completed;Falls prevention discussed    FALL RISK PREVENTION PERTAINING TO THE HOME:  Any stairs in or around the home? Yes  If so, are there any without handrails? Yes  Home free of loose throw rugs in walkways, pet beds,  electrical cords, etc? Yes  Adequate lighting in your home to reduce risk of falls? Yes   ASSISTIVE DEVICES UTILIZED TO PREVENT FALLS:  Life alert? No  Use of a cane, walker or w/c? Yes  Grab bars in the bathroom? No  Shower chair or bench in shower? Yes  Elevated toilet seat or a handicapped toilet? Yes    Cognitive Function:    10/13/2020   10:35 AM 06/20/2019    2:49 PM 06/15/2018    8:23 AM 05/02/2016    9:43 AM  MMSE - Mini Mental State Exam  Not completed: Refused     Orientation to time  Orientation to Place  Registration  Attention/ Calculation  5 0 0  Recall  Language- name 2 objects   0 0  Language- repeat  Language- follow 3 step command   3 3  Language- read &  follow direction   0 0  Write a sentence   0 0  Copy design   0 0  Total score   20 20        10/24/2022   11:15 AM 10/18/2021   11:28 AM  6CIT Screen  What Year? 0 points 0 points  What month? 0 points 0 points  What time? 0 points 0 points  Count back from 20 0 points 0 points  Months in reverse 0 points 0 points  Repeat phrase 0 points 0 points  Total Score 0 points 0 points    Immunizations Immunization History  Administered Date(s) Administered   COVID-19, mRNA, vaccine(Comirnaty)12 years and older 05/06/2022   Fluad Quad(high Dose 65+) 04/29/2020, 04/12/2022   Influenza, High Dose Seasonal PF 04/05/2017, 04/03/2019, 04/08/2021   Influenza,inj,Quad PF,6+ Mos 03/30/2015, 05/02/2016, 04/20/2018   Influenza-Unspecified 04/03/2014, 05/04/2014   Moderna Sars-Covid-2 Vaccination 08/16/2019, 09/16/2019, 07/24/2020   Pneumococcal Conjugate-13 11/14/2013   Pneumococcal Polysaccharide-23 04/03/2015, 10/20/2020   Tdap 01/31/2011   Zoster Recombinat (Shingrix) 04/26/2021, 10/23/2021    TDAP status: Due, Education has been provided regarding the importance of this vaccine. Advised may receive this vaccine at local pharmacy or Health Dept. Aware to provide a copy  of the vaccination record if obtained from local pharmacy or Health Dept. Verbalized acceptance and understanding.  Flu Vaccine status: Up to date  Pneumococcal vaccine status: Up to date  Covid-19 vaccine status: Information provided on how to obtain vaccines.   Qualifies for Shingles Vaccine? Yes   Zostavax completed No   Shingrix Completed?: Yes  Screening Tests Health Maintenance  Topic Date Due   DTaP/Tdap/Td (2 - Td or Tdap) 01/30/2021   Diabetic kidney evaluation - Urine ACR  10/16/2022   Lung Cancer Screening  12/10/2022   FOOT EXAM  01/15/2023   INFLUENZA VACCINE  02/02/2023   HEMOGLOBIN A1C  03/24/2023   Diabetic kidney evaluation - eGFR measurement  07/26/2023   COLONOSCOPY (Pts 45-66yrs Insurance coverage will need to be confirmed)  08/16/2023   OPHTHALMOLOGY EXAM  10/14/2023   Medicare Annual Wellness (AWV)  10/24/2023   Pneumonia Vaccine 40+ Years old  Completed   COVID-19 Vaccine  Completed   Hepatitis C Screening  Completed   Zoster Vaccines- Shingrix  Completed   HPV VACCINES  Aged Out    Health Maintenance  Health Maintenance Due  Topic Date Due   DTaP/Tdap/Td (2 - Td or Tdap) 01/30/2021   Diabetic kidney evaluation - Urine ACR  10/16/2022    Colorectal cancer screening: Type of screening: Colonoscopy. Completed 08/15/2018. Repeat every 5 years  Lung Cancer Screening: (Low Dose CT Chest recommended if Age 10-80 years, 30 pack-year currently smoking OR have quit w/in 15years.) does qualify.   Lung Cancer Screening Referral: 12/09/21  Additional Screening:  Hepatitis C Screening: does qualify; Completed 05/02/16  Vision Screening: Recommended annual ophthalmology exams for early detection of glaucoma and other disorders of the eye. Is the patient up to date with their annual eye exam?  Yes  Who is the provider or what is the name of the office in which the patient attends annual eye exams? [facility] If pt is not established with a provider, would  they like to be referred to a provider to establish care? No .   Dental Screening: Recommended annual dental exams for proper oral hygiene  Community Resource Referral / Chronic Care Management: CRR required this visit?  No   CCM required this visit?  No  Plan:     I have personally reviewed and noted the following in the patient's chart:   Medical and social history Use of alcohol, tobacco or illicit drugs  Current medications and supplements including opioid prescriptions. Patient is not currently taking opioid prescriptions. Functional ability and status Nutritional status Physical activity Advanced directives List of other physicians Hospitalizations, surgeries, and ER visits in previous 12 months Vitals Screenings to include cognitive, depression, and falls Referrals and appointments  In addition, I have reviewed and discussed with patient certain preventive protocols, quality metrics, and best practice recommendations. A written personalized care plan for preventive services as well as general preventive health recommendations were provided to patient.     Maryan Puls, LPN   [id number]/61/0960   Nurse Notes: none

## 2022-10-24 NOTE — Patient Instructions (Signed)
Gregory Abbott , Thank you for taking time to come for your Medicare Wellness Visit. I appreciate your ongoing commitment to your health goals. Please review the following plan we discussed and let me know if I can assist you in the future.   These are the goals we discussed:  Goals      Increase physical activity     Starting 06/15/2018, I will continue to walk at least 30 min 2 days per week.      Patient Stated     06/20/2019, I will maintain and continue medications as prescribed.      Patient Stated     10/13/2020, I will continue to walk when weather permits and yardwork.     Patient Stated     No new goals        This is a list of the screening recommended for you and due dates:  Health Maintenance  Topic Date Due   DTaP/Tdap/Td vaccine (2 - Td or Tdap) 01/30/2021   Yearly kidney health urinalysis for diabetes  10/16/2022   Screening for Lung Cancer  12/10/2022   Complete foot exam   01/15/2023   Flu Shot  02/02/2023   Hemoglobin A1C  03/24/2023   Yearly kidney function blood test for diabetes  07/26/2023   Colon Cancer Screening  08/16/2023   Eye exam for diabetics  10/14/2023   Medicare Annual Wellness Visit  10/24/2023   Pneumonia Vaccine  Completed   COVID-19 Vaccine  Completed   Hepatitis C Screening: USPSTF Recommendation to screen - Ages 61-79 yo.  Completed   Zoster (Shingles) Vaccine  Completed   HPV Vaccine  Aged Out    Advanced directives: Please bring a copy of your health care power of attorney and living will to the office to be added to your chart at your convenience.   Conditions/risks identified: none  Next appointment: Follow up in one year for your annual wellness visit. 10/25/23 @ 1:00 televisit  Preventive Care 65 Years and Older, Male  Preventive care refers to lifestyle choices and visits with your health care provider that can promote health and wellness. What does preventive care include? A yearly physical exam. This is also called an  annual well check. Dental exams once or twice a year. Routine eye exams. Ask your health care provider how often you should have your eyes checked. Personal lifestyle choices, including: Daily care of your teeth and gums. Regular physical activity. Eating a healthy diet. Avoiding tobacco and drug use. Limiting alcohol use. Practicing safe sex. Taking low doses of aspirin every day. Taking vitamin and mineral supplements as recommended by your health care provider. What happens during an annual well check? The services and screenings done by your health care provider during your annual well check will depend on your age, overall health, lifestyle risk factors, and family history of disease. Counseling  Your health care provider may ask you questions about your: Alcohol use. Tobacco use. Drug use. Emotional well-being. Home and relationship well-being. Sexual activity. Eating habits. History of falls. Memory and ability to understand (cognition). Work and work Astronomer. Screening  You may have the following tests or measurements: Height, weight, and BMI. Blood pressure. Lipid and cholesterol levels. These may be checked every 5 years, or more frequently if you are over 29 years old. Skin check. Lung cancer screening. You may have this screening every year starting at age 68 if you have a 30-pack-year history of smoking and currently smoke or have quit  within the past 15 years. Fecal occult blood test (FOBT) of the stool. You may have this test every year starting at age 35. Flexible sigmoidoscopy or colonoscopy. You may have a sigmoidoscopy every 5 years or a colonoscopy every 10 years starting at age 51. Prostate cancer screening. Recommendations will vary depending on your family history and other risks. Hepatitis C blood test. Hepatitis B blood test. Sexually transmitted disease (STD) testing. Diabetes screening. This is done by checking your blood sugar (glucose) after you  have not eaten for a while (fasting). You may have this done every 1-3 years. Abdominal aortic aneurysm (AAA) screening. You may need this if you are a current or former smoker. Osteoporosis. You may be screened starting at age 31 if you are at high risk. Talk with your health care provider about your test results, treatment options, and if necessary, the need for more tests. Vaccines  Your health care provider may recommend certain vaccines, such as: Influenza vaccine. This is recommended every year. Tetanus, diphtheria, and acellular pertussis (Tdap, Td) vaccine. You may need a Td booster every 10 years. Zoster vaccine. You may need this after age 71. Pneumococcal 13-valent conjugate (PCV13) vaccine. One dose is recommended after age 20. Pneumococcal polysaccharide (PPSV23) vaccine. One dose is recommended after age 67. Talk to your health care provider about which screenings and vaccines you need and how often you need them. This information is not intended to replace advice given to you by your health care provider. Make sure you discuss any questions you have with your health care provider. Document Released: 07/17/2015 Document Revised: 03/09/2016 Document Reviewed: 04/21/2015 Elsevier Interactive Patient Education  2017 [employer].  Fall Prevention in the Home Falls can cause injuries. They can happen to people of all ages. There are many things you can do to make your home safe and to help prevent falls. What can I do on the outside of my home? Regularly fix the edges of walkways and driveways and fix any cracks. Remove anything that might make you trip as you walk through a door, such as a raised step or threshold. Trim any bushes or trees on the path to your home. Use bright outdoor lighting. Clear any walking paths of anything that might make someone trip, such as rocks or tools. Regularly check to see if handrails are loose or broken. Make sure that both sides of any steps have  handrails. Any raised decks and porches should have guardrails on the edges. Have any leaves, snow, or ice cleared regularly. Use sand or salt on walking paths during winter. Clean up any spills in your garage right away. This includes oil or grease spills. What can I do in the bathroom? Use night lights. Install grab bars by the toilet and in the tub and shower. Do not use towel bars as grab bars. Use non-skid mats or decals in the tub or shower. If you need to sit down in the shower, use a plastic, non-slip stool. Keep the floor dry. Clean up any water that spills on the floor as soon as it happens. Remove soap buildup in the tub or shower regularly. Attach bath mats securely with double-sided non-slip rug tape. Do not have throw rugs and other things on the floor that can make you trip. What can I do in the bedroom? Use night lights. Make sure that you have a light by your bed that is easy to reach. Do not use any sheets or blankets that are too  big for your bed. They should not hang down onto the floor. Have a firm chair that has side arms. You can use this for support while you get dressed. Do not have throw rugs and other things on the floor that can make you trip. What can I do in the kitchen? Clean up any spills right away. Avoid walking on wet floors. Keep items that you use a lot in easy-to-reach places. If you need to reach something above you, use a strong step stool that has a grab bar. Keep electrical cords out of the way. Do not use floor polish or wax that makes floors slippery. If you must use wax, use non-skid floor wax. Do not have throw rugs and other things on the floor that can make you trip. What can I do with my stairs? Do not leave any items on the stairs. Make sure that there are handrails on both sides of the stairs and use them. Fix handrails that are broken or loose. Make sure that handrails are as long as the stairways. Check any carpeting to make sure  that it is firmly attached to the stairs. Fix any carpet that is loose or worn. Avoid having throw rugs at the top or bottom of the stairs. If you do have throw rugs, attach them to the floor with carpet tape. Make sure that you have a light switch at the top of the stairs and the bottom of the stairs. If you do not have them, ask someone to add them for you. What else can I do to help prevent falls? Wear shoes that: Do not have high heels. Have rubber bottoms. Are comfortable and fit you well. Are closed at the toe. Do not wear sandals. If you use a stepladder: Make sure that it is fully opened. Do not climb a closed stepladder. Make sure that both sides of the stepladder are locked into place. Ask someone to hold it for you, if possible. Clearly mark and make sure that you can see: Any grab bars or handrails. First and last steps. Where the edge of each step is. Use tools that help you move around (mobility aids) if they are needed. These include: Canes. Walkers. Scooters. Crutches. Turn on the lights when you go into a dark area. Replace any light bulbs as soon as they burn out. Set up your furniture so you have a clear path. Avoid moving your furniture around. If any of your floors are uneven, fix them. If there are any pets around you, be aware of where they are. Review your medicines with your doctor. Some medicines can make you feel dizzy. This can increase your chance of falling. Ask your doctor what other things that you can do to help prevent falls. This information is not intended to replace advice given to you by your health care provider. Make sure you discuss any questions you have with your health care provider. Document Released: 04/16/2009 Document Revised: 11/26/2015 Document Reviewed: 07/25/2014 Elsevier Interactive Patient Education  2017 [employer].

## 2022-10-26 ENCOUNTER — Encounter
Payer: Medicare Other | Attending: Physical Medicine and Rehabilitation | Admitting: Physical Medicine and Rehabilitation

## 2022-10-26 VITALS — BP 163/81 | HR 72 | Ht 71.0 in | Wt 184.6 lb

## 2022-10-26 DIAGNOSIS — G894 Chronic pain syndrome: Secondary | ICD-10-CM | POA: Diagnosis not present

## 2022-10-26 DIAGNOSIS — M1611 Unilateral primary osteoarthritis, right hip: Secondary | ICD-10-CM | POA: Diagnosis not present

## 2022-10-26 DIAGNOSIS — M5416 Radiculopathy, lumbar region: Secondary | ICD-10-CM | POA: Insufficient documentation

## 2022-10-26 NOTE — Progress Notes (Signed)
Subjective:    Patient ID: Gregory Abbott, male    DOB: [birth year], 77 y.o.   MRN: [id number]  HPI  Gregory Abbott is a 77 y.o. year old male  who  has a past medical history of Blood transfusion without reported diagnosis, BPH (benign prostatic hypertrophy), Cataract, Childhood asthma, COPD (chronic obstructive pulmonary disease), Eczema, Ex-smoker, GERD (gastroesophageal reflux disease), History of chicken pox, Hypertension, Lower back pain, Osteoarthritis of neck, Personal history of colonic polyps (05/07/2013), PUD (peptic ulcer disease), S/P splenectomy, and Shortness of breath.   They are presenting to PM&R clinic for follow up related to right sided low back and leg pain .  Plan from last visit:  Chronic right-sided lumbar radiculopathy Assessment & Plan: Increase gabapentin to 300 mg twice daily. Call clinic if you have any side effects from this and we can reduce the dose.   If you need additional pain control, you can use Tylenol up to 1000 mg per dose up to 3 times daily.    I am referring you to Dr. Wynn Banker for epidural steroid injections. Once this is completed, you will follow up with me after 6-8 weeks.      Primary osteoarthritis of right hip Assessment & Plan: Continue PT   If the epidural steroid injection does not help, we will consider injections into the hip in the future.     Interval Hx:  - Therapies: He went to PT for 12 weeks without improvement; doing HEP with chiropractor twice per day. Going well, he anticipates by the end of the program he will be able to independently perform yardwork.    - Follow ups:  RIght L4-5 Lumbar transforaminal epidural steroid injection    09/08/22 with Dr. Wynn Banker; States the shot did not do anything.    - Started with a chiropractor 6-7 weeks ago; states that they have been working on his neck and low back. States xrays have showed that his alignment is improving, but it is a slow process. He goes three times per week  until 8 weeks, and then twice a week for six weeks. Seeing [facility] on [address], [address] and [place].     - Falls: None since last visit; states his leg does get tired but when he slows down.    - NWG:NFAOZ using the cane to walk. Is working on his gait and trying to not anticipate the pain in his legs when he steps down.    - Medications: He got a referral for psychiatry, and states that has been going better. He has stopped gabapentin because "it didn't help". Stopped tramadol as well because of a    - Other concerns: Pain much improved   Pain Inventory Average Pain 5 Pain Right Now 1 My pain is intermittent and sharp  In the last 24 hours, has pain interfered with the following? General activity 1 Relation with others 0 Enjoyment of life 1 What TIME of day is your pain at its worst? daytime Sleep (in general) Good  Pain is worse with: some activites Pain improves with: rest Relief from Meds:  na  Family History  Problem Relation Age of Onset   Diabetes Mother    Hypertension Mother    Hyperlipidemia Mother    Heart disease Mother    Stroke Father    Colon cancer Neg Hx    Rectal cancer Neg Hx    Stomach cancer Neg Hx    Esophageal cancer Neg Hx  Social History   Socioeconomic History   Marital status: Married    Spouse name: Not on file   Number of children: 2   Years of education: Not on file   Highest education level: Not on file  Occupational History   Occupation: Retired  Tobacco Use   Smoking status: Former    Packs/day: 0.50    Years: 49.00    Additional pack years: 0.00    Total pack years: 24.50    Types: Cigarettes    Quit date: 11/06/2013    Years since quitting: 8.9   Smokeless tobacco: Never  Vaping Use   Vaping Use: Never used  Substance and Sexual Activity   Alcohol use: Not Currently    Alcohol/week: 2.0 standard drinks of alcohol    Types: 1 Cans of beer, 1 Shots of liquor per week    Comment:  occasional   Drug use: No   Sexual activity: Yes  Other Topics Concern   Not on file  Social History Narrative   Lives with wife, no pets   Occupation: retired, Education officer, environmental   Edu: Degree   Activity: housework, no regular exercise   Diet: good water, fruits/vegetables daily   Social Determinants of Health   Financial Resource Strain: Low Risk  (10/24/2022)   Overall Financial Resource Strain (CARDIA)    Difficulty of Paying Living Expenses: Not hard at all  Food Insecurity: No Food Insecurity (10/24/2022)   Hunger Vital Sign    Worried About Running Out of Food in the Last Year: Never true    Ran Out of Food in the Last Year: Never true  Transportation Needs: No Transportation Needs (10/24/2022)   PRAPARE - Administrator, Civil Service (Medical): No    Lack of Transportation (Non-Medical): No  Physical Activity: Sufficiently Active (10/24/2022)   Exercise Vital Sign    Days of Exercise per Week: 7 days    Minutes of Exercise per Session: 60 min  Stress: No Stress Concern Present (10/24/2022)   [employer] of Occupational Health - Occupational Stress Questionnaire    Feeling of Stress : Not at all  Social Connections: Moderately Integrated (10/24/2022)   Social Connection and Isolation Panel [NHANES]    Frequency of Communication with Friends and Family: More than three times a week    Frequency of Social Gatherings with Friends and Family: More than three times a week    Attends Religious Services: More than 4 times per year    Active Member of [employer] or Organizations: No    Attends Banker Meetings: Never    Marital Status: Married   Past Surgical History:  Procedure Laterality Date   AAA screen  04/2013   neg for AAA   CATARACT EXTRACTION Left 05/2013   [facility]   CATARACT EXTRACTION W/PHACO Right 10/06/2015   Procedure: CATARACT EXTRACTION PHACO AND INTRAOCULAR LENS PLACEMENT (IOC);  Surgeon: Galen Manila, MD;  Location: [facility] ORS;  Service:  Ophthalmology;  Laterality: Right;  [place] AP% CDE fluid pack lot 3 [id number] H exp 04/02/2017   COLONOSCOPY  2009   COLONOSCOPY  05/2013   1 polyp, int/ext hemorrhoids Leone Payor)   COLONOSCOPY  08/2018   Diminutive adenoma, no f/u planned Leone Payor)   KNEE ARTHROSCOPY     lower extremity arterial doppler  2013   WNL   SPLENECTOMY, PARTIAL  1998   fall down stairs   Past Surgical History:  Procedure Laterality Date   AAA screen  04/2013  neg for AAA   CATARACT EXTRACTION Left 05/2013   [facility]   CATARACT EXTRACTION W/PHACO Right 10/06/2015   Procedure: CATARACT EXTRACTION PHACO AND INTRAOCULAR LENS PLACEMENT (IOC);  Surgeon: Galen Manila, MD;  Location: [facility] OR[facility];  Service: Ophthalmology;  Laterality: Right;  [place] AP% CDE fluid pack lot 3 [id number] H exp 04/02/2017   COLONOSCOPY  2009   COLONOSCOPY  05/2013   1 polyp, int/ext hemorrhoids Leone Payor)   COLONOSCOPY  08/2018   Diminutive adenoma, no f/u planned Leone Payor)   KNEE ARTHROSCOPY     lower extremity arterial doppler  2013   WNL   SPLENECTOMY, PARTIAL  1998   fall down stairs   Past Medical History:  Diagnosis Date   Blood transfusion without reported diagnosis    BPH (benign prostatic hypertrophy)    Cataract    Childhood asthma    COPD (chronic obstructive pulmonary disease)    Eczema    Ex-smoker    40 PY hx   GERD (gastroesophageal reflux disease)    History of chicken pox    Hypertension    Lower back pain    h/o herniated and bulging discs lower back   Osteoarthritis of neck    Personal history of colonic polyps 05/07/2013   resected not retrieved   PUD (peptic ulcer disease)    S/P splenectomy    partial   Shortness of breath    BP (!) 163/81   Pulse 72   Ht 5\' 11"  (1.803 m)   Wt 184 lb 9.6 oz (83.7 kg)   SpO2 97%   BMI 25.75 kg/m   Opioid Risk Score:   Fall Risk Score:  `1  Depression screen [facility] 2/9     10/24/2022   11:11 AM 08/01/2022    1:18 PM 07/25/2022    9:59 AM 10/18/2021   11:22 AM  10/13/2020   10:33 AM 06/20/2019    2:47 PM 06/15/2018    8:20 AM  Depression screen PHQ 2/9  Decreased Interest 0 0 0 0 0 0 0  Down, Depressed, Hopeless 0 0 0 0 0 0 0  PHQ - 2 Score 0 0 0 0 0 0 0  Altered sleeping  0   0 0 0  Tired, decreased energy  0   0 0 0  Change in appetite  0   0 0 0  Feeling bad or failure about yourself   0   0 0 0  Trouble concentrating  0   0 0 0  Moving slowly or fidgety/restless  0   0 0 0  Suicidal thoughts  0   0 0 0  PHQ-9 Score  0   0 0 0  Difficult doing work/chores     Not difficult at all Not difficult at all Not difficult at all     Review of Systems  Musculoskeletal:        Right leg pain   All other systems reviewed and are negative.     Objective:   Physical Exam   PE: Constitution: Appropriate appearance for age. No apparent distress   Resp: No respiratory distress. No accessory muscle usage. on RA and CTAB Cardio: Well perfused appearance. No peripheral edema. Abdomen: Nondistended. Nontender.   Psych: Appropriate mood and affect. Neuro: AAOx4. No apparent cognitive deficits   Neurologic Exam:   DTRs: Reflexes were 2+ in bilateral achilles, patella, biceps, BR and triceps. Babinsky: flexor responses b/l.   Hoffmans: negative b/l Sensory exam: revealed normal sensation  in all dermatomal regions in bilateral lower extremities Motor exam: strength 5/5 throughout bilateral lower extremities; does guard R hip with hip flexion but can provide full resistance without pain.  Coordination: Fine motor coordination was normal.   Gait: +antalgic gait favoring L leg; improved from last visit. Can walk without cane.  R Hip exam/Back exam:  No ttp lumbar paraspinal, spinous processes, PSISs, SI joints, lateral hips or thighs. + tightness/stretch sensation in R hamstrings with SLR.   - facet loading, lumbar paraspinal tenderness,   - Slump, SLR bilaterally  - FABER, FADIR     Assessment & Plan:   ALIK MAWSON is a 77 y.o. year old  male  who  has a past medical history of Blood transfusion without reported diagnosis, BPH (benign prostatic hypertrophy), Cataract, Childhood asthma, COPD (chronic obstructive pulmonary disease), Eczema, Ex-smoker, GERD (gastroesophageal reflux disease), History of chicken pox, Hypertension, Lower back pain, Osteoarthritis of neck, Personal history of colonic polyps (05/07/2013), PUD (peptic ulcer disease), S/P splenectomy, and Shortness of breath.   They are presenting to PM&R clinic as follow up for RLE pain and low back pain .  Chronic pain syndrome Patient not currently needing tramadol or gabapentin; removed from medications list Advised he can use OTC bengay or aspercreme for nighttime tightness in his right thigh  Chronic right-sided lumbar radiculopathy Did not benefit from [facility] and no radicular symptoms on exam today Doing extremely well with symptoms with chiropractic care; can continue with them Continue HEP Follow up PRN  Primary osteoarthritis of right hip Can follow up for hip injections if desired; no symptoms today

## 2022-10-26 NOTE — Patient Instructions (Signed)
Chronic right-sided lumbar radiculopathy Doing extremely well with symptoms with chiropractic care; can continue with them Continue HEP Follow up PRN  Primary osteoarthritis of right hip Can follow up for hip injections if desired; no symptoms today

## 2022-11-17 ENCOUNTER — Other Ambulatory Visit: Payer: Self-pay | Admitting: Family Medicine

## 2022-11-17 DIAGNOSIS — R21 Rash and other nonspecific skin eruption: Secondary | ICD-10-CM

## 2022-11-17 NOTE — Telephone Encounter (Signed)
Kenalog crm Last filled:  10/05/22, #60 g Last OV:  2 mo DM f/u Next OV:  12/23/22, CPE

## 2022-11-18 NOTE — Telephone Encounter (Signed)
ERx 

## 2022-11-28 ENCOUNTER — Other Ambulatory Visit: Payer: Self-pay | Admitting: Acute Care

## 2022-11-28 DIAGNOSIS — Z87891 Personal history of nicotine dependence: Secondary | ICD-10-CM

## 2022-11-28 DIAGNOSIS — Z122 Encounter for screening for malignant neoplasm of respiratory organs: Secondary | ICD-10-CM

## 2022-12-12 ENCOUNTER — Other Ambulatory Visit: Payer: Self-pay | Admitting: Family Medicine

## 2022-12-12 DIAGNOSIS — E0590 Thyrotoxicosis, unspecified without thyrotoxic crisis or storm: Secondary | ICD-10-CM

## 2022-12-12 DIAGNOSIS — Z125 Encounter for screening for malignant neoplasm of prostate: Secondary | ICD-10-CM

## 2022-12-12 DIAGNOSIS — E1169 Type 2 diabetes mellitus with other specified complication: Secondary | ICD-10-CM

## 2022-12-12 NOTE — Addendum Note (Signed)
Addended by: Eustaquio Boyden on: 12/12/2022 01:17 PM   Modules accepted: Orders

## 2022-12-16 ENCOUNTER — Other Ambulatory Visit (INDEPENDENT_AMBULATORY_CARE_PROVIDER_SITE_OTHER): Payer: Medicare Other

## 2022-12-16 ENCOUNTER — Ambulatory Visit
Admission: RE | Admit: 2022-12-16 | Discharge: 2022-12-16 | Disposition: A | Discharge status: Home / Self Care | Payer: Medicare Other | Source: Ambulatory Visit | Attending: Acute Care | Admitting: Acute Care

## 2022-12-16 DIAGNOSIS — Z125 Encounter for screening for malignant neoplasm of prostate: Secondary | ICD-10-CM

## 2022-12-16 DIAGNOSIS — Z122 Encounter for screening for malignant neoplasm of respiratory organs: Secondary | ICD-10-CM

## 2022-12-16 DIAGNOSIS — E1169 Type 2 diabetes mellitus with other specified complication: Secondary | ICD-10-CM

## 2022-12-16 DIAGNOSIS — E0590 Thyrotoxicosis, unspecified without thyrotoxic crisis or storm: Secondary | ICD-10-CM | POA: Diagnosis not present

## 2022-12-16 DIAGNOSIS — Z87891 Personal history of nicotine dependence: Secondary | ICD-10-CM

## 2022-12-16 DIAGNOSIS — E785 Hyperlipidemia, unspecified: Secondary | ICD-10-CM

## 2022-12-16 LAB — COMPREHENSIVE METABOLIC PANEL
ALT: 11 U/L (ref 0–53)
AST: 13 U/L (ref 0–37)
Albumin: 4 g/dL (ref 3.5–5.2)
Alkaline Phosphatase: 46 U/L (ref 39–117)
BUN: 13 mg/dL (ref 6–23)
CO2: 28 mEq/L (ref 19–32)
Calcium: 9.4 mg/dL (ref 8.4–10.5)
Chloride: 101 mEq/L (ref 96–112)
Creatinine, Ser: 0.93 mg/dL (ref 0.40–1.50)
GFR: 79.21 mL/min (ref >60.00)
Glucose, Bld: 164 mg/dL — ABNORMAL HIGH (ref 70–99)
Potassium: 4.2 mEq/L (ref 3.5–5.1)
Sodium: 136 mEq/L (ref 135–145)
Total Bilirubin: 0.9 mg/dL (ref 0.2–1.2)
Total Protein: 7.2 g/dL (ref 6.0–8.3)

## 2022-12-16 LAB — LIPID PANEL
Cholesterol: 168 mg/dL (ref 0–200)
HDL: 50.2 mg/dL (ref >39.00)
LDL Cholesterol: 98 mg/dL (ref 0–99)
NonHDL: 118.22
Total CHOL/HDL Ratio: 3
Triglycerides: 103 mg/dL (ref 0.0–149.0)
VLDL: 20.6 mg/dL (ref 0.0–40.0)

## 2022-12-16 LAB — TSH: TSH: 1.26 u[IU]/mL (ref 0.35–5.50)

## 2022-12-16 LAB — VITAMIN B12: Vitamin B-12: >1500 pg/mL — ABNORMAL HIGH (ref 211–911)

## 2022-12-16 LAB — T4, FREE: Free T4: 1.08 ng/dL (ref 0.60–1.60)

## 2022-12-16 LAB — MICROALBUMIN / CREATININE URINE RATIO
Creatinine,U: 209.8 mg/dL
Microalb Creat Ratio: 0.4 mg/g (ref 0.0–30.0)
Microalb, Ur: 0.9 mg/dL (ref 0.0–1.9)

## 2022-12-16 LAB — PSA, MEDICARE: PSA: 2.61 ng/ml (ref 0.10–4.00)

## 2022-12-16 LAB — HEMOGLOBIN A1C: Hgb A1c MFr Bld: 6.8 % — ABNORMAL HIGH (ref 4.6–6.5)

## 2022-12-20 DIAGNOSIS — M62830 Muscle spasm of back: Secondary | ICD-10-CM | POA: Diagnosis not present

## 2022-12-20 DIAGNOSIS — M9902 Segmental and somatic dysfunction of thoracic region: Secondary | ICD-10-CM | POA: Diagnosis not present

## 2022-12-20 DIAGNOSIS — M9903 Segmental and somatic dysfunction of lumbar region: Secondary | ICD-10-CM | POA: Diagnosis not present

## 2022-12-20 DIAGNOSIS — M5431 Sciatica, right side: Secondary | ICD-10-CM | POA: Diagnosis not present

## 2022-12-22 DIAGNOSIS — M62830 Muscle spasm of back: Secondary | ICD-10-CM | POA: Diagnosis not present

## 2022-12-22 DIAGNOSIS — M9902 Segmental and somatic dysfunction of thoracic region: Secondary | ICD-10-CM | POA: Diagnosis not present

## 2022-12-22 DIAGNOSIS — M9903 Segmental and somatic dysfunction of lumbar region: Secondary | ICD-10-CM | POA: Diagnosis not present

## 2022-12-22 DIAGNOSIS — M5431 Sciatica, right side: Secondary | ICD-10-CM | POA: Diagnosis not present

## 2022-12-23 ENCOUNTER — Ambulatory Visit (INDEPENDENT_AMBULATORY_CARE_PROVIDER_SITE_OTHER): Payer: Medicare Other | Admitting: Family Medicine

## 2022-12-23 ENCOUNTER — Encounter: Payer: Self-pay | Admitting: Family Medicine

## 2022-12-23 VITALS — BP 148/80 | HR 70 | Temp 97.4°F | Ht 69.5 in | Wt 189.1 lb

## 2022-12-23 DIAGNOSIS — I700 Atherosclerosis of aorta: Secondary | ICD-10-CM | POA: Diagnosis not present

## 2022-12-23 DIAGNOSIS — N529 Male erectile dysfunction, unspecified: Secondary | ICD-10-CM | POA: Diagnosis not present

## 2022-12-23 DIAGNOSIS — M1611 Unilateral primary osteoarthritis, right hip: Secondary | ICD-10-CM

## 2022-12-23 DIAGNOSIS — E1169 Type 2 diabetes mellitus with other specified complication: Secondary | ICD-10-CM

## 2022-12-23 DIAGNOSIS — Z9081 Acquired absence of spleen: Secondary | ICD-10-CM

## 2022-12-23 DIAGNOSIS — Z87891 Personal history of nicotine dependence: Secondary | ICD-10-CM | POA: Diagnosis not present

## 2022-12-23 DIAGNOSIS — M79604 Pain in right leg: Secondary | ICD-10-CM

## 2022-12-23 DIAGNOSIS — E0590 Thyrotoxicosis, unspecified without thyrotoxic crisis or storm: Secondary | ICD-10-CM

## 2022-12-23 DIAGNOSIS — R0989 Other specified symptoms and signs involving the circulatory and respiratory systems: Secondary | ICD-10-CM | POA: Diagnosis not present

## 2022-12-23 DIAGNOSIS — K219 Gastro-esophageal reflux disease without esophagitis: Secondary | ICD-10-CM | POA: Diagnosis not present

## 2022-12-23 DIAGNOSIS — M1120 Other chondrocalcinosis, unspecified site: Secondary | ICD-10-CM

## 2022-12-23 DIAGNOSIS — Z7189 Other specified counseling: Secondary | ICD-10-CM

## 2022-12-23 DIAGNOSIS — L298 Other pruritus: Secondary | ICD-10-CM | POA: Diagnosis not present

## 2022-12-23 DIAGNOSIS — J449 Chronic obstructive pulmonary disease, unspecified: Secondary | ICD-10-CM | POA: Diagnosis not present

## 2022-12-23 DIAGNOSIS — I10 Essential (primary) hypertension: Secondary | ICD-10-CM | POA: Diagnosis not present

## 2022-12-23 DIAGNOSIS — E785 Hyperlipidemia, unspecified: Secondary | ICD-10-CM

## 2022-12-23 DIAGNOSIS — M5416 Radiculopathy, lumbar region: Secondary | ICD-10-CM

## 2022-12-23 MED ORDER — ROSUVASTATIN CALCIUM 5 MG PO TABS: 5.0000 mg | ORAL_TABLET | Freq: Every day | ORAL | 4 refills | Status: DC

## 2022-12-23 MED ORDER — PERMETHRIN 5 % EX CREA: 1.0000 | TOPICAL_CREAM | Freq: Once | CUTANEOUS | 0 refills | Status: AC

## 2022-12-23 MED ORDER — SILDENAFIL CITRATE 20 MG PO TABS: ORAL_TABLET | ORAL | 11 refills | Status: DC

## 2022-12-23 MED ORDER — OMEPRAZOLE 40 MG PO CPDR: 40.0000 mg | DELAYED_RELEASE_CAPSULE | Freq: Every day | ORAL | 4 refills | Status: DC

## 2022-12-23 MED ORDER — AMLODIPINE BESYLATE 10 MG PO TABS: 10.0000 mg | ORAL_TABLET | Freq: Every day | ORAL | 4 refills | Status: DC

## 2022-12-23 MED ORDER — ALBUTEROL SULFATE HFA 108 (90 BASE) MCG/ACT IN AERS: INHALATION_SPRAY | RESPIRATORY_TRACT | 3 refills | Status: DC

## 2022-12-23 MED ORDER — SILDENAFIL CITRATE 100 MG PO TABS: 100.0000 mg | ORAL_TABLET | Freq: Every day | ORAL | 11 refills | Status: DC | PRN

## 2022-12-23 NOTE — Assessment & Plan Note (Signed)
Advanced directives - has this at home. Daughter to be HCPOA. Asked to bring us copy for chart.  ?

## 2022-12-23 NOTE — Patient Instructions (Addendum)
I'm glad you're seeing dermatology in July. We previously tried stopping losartan and hydrochlorothiazide in an effort to improve itching/rash however this has persisted.  Send me a picture of ingredients for diabetes supplement to review.  Consider RSV shot through pharmacy.  Bring [place] a copy of your living will/advanced directive Try permethrin cream for possible scabies - one time application.  Price out sildenafil 20mg  vs 100mg  tablets - both sent to [facility] pharmacy.  Return in 6 months for diabetes follow up visit.

## 2022-12-23 NOTE — Progress Notes (Unsigned)
Ph: [phone] Fax: [phone]   Patient ID: Gregory Abbott, male    DOB: 1946/04/13, 77 y.o.   MRN: [id number]  This visit was conducted in person.  BP (!) 148/80   Pulse 70   Temp (!) 97.4 F (36.3 C) (Temporal)   Ht 5' 9.5" (1.765 m)   Wt 189 lb 2 oz (85.8 kg)   SpO2 96%   BMI 27.53 kg/m   BP Readings from Last 3 Encounters:  12/23/22 (!) 148/80  10/26/22 (!) 163/81  09/21/22 (!) 144/78   CC: CPE/AMW Subjective:   HPI: Gregory Abbott is a 77 y.o. male presenting on 12/23/2022 for Annual Exam (MCR prt 2 [AWV- 10/24/22].)   Saw health advisor 10/2022 for medicare wellness visit. Note reviewed.   No results found.  Flowsheet Row Clinical Support from 10/24/2022 in [facility] [place]  PHQ-2 Total Score 0          10/26/2022    9:47 AM 10/24/2022   11:06 AM 08/01/2022    1:18 PM 07/25/2022    9:58 AM 10/18/2021   11:26 AM  Fall Risk   Falls in the past year? 0 0 0 0 0  Number falls in past yr:  0 0  0  Injury with Fall?  0 0  0  Risk for fall due to :  No Fall Risks   No Fall Risks  Follow up  Falls prevention discussed;Falls evaluation completed;Education provided   Falls prevention discussed    Chronic R leg and lower back pain. Saw physiatrists Dr Shearon Stalls and Kirsteins s/p R TF ESI L4/5 09/2022 - no significant benefit. Lumbar MRI showed diffuse DDD with possible L L4 nerve root impingement with mod lumbar stenosis with spondylosis and facet arthropathy. Also with known R hip osteoarthritis (ortho eval 2023). [facility] completed PT course (08/2022). Continues gabapentin 300mg  BID. Latest started seeing chiropractor with marked benefit - he was able to stop gabapentin, not using tramadol. He continues walking with cane. Notes ongoing R leg pain worse with prolonged sitting "locks up".   HTN - on atenolol 100mg  daily, amlodipine 10mg  daily. Stopping hydrochlorothiazide helped itchy rash. H/o rash to losartan as well.   DM - last visit we  restarted metformin XR 500mg  daily - he stopped it as was concerned it may worsen itching. He's started using supplement from his chiropractor to help control sugar.   Chronic itchy skin rash to forearms, medial proximal thighs, trunk and back into sacral region. Continues topical steroids (Cortizone-10, triamcinolone) as well as coconut oil/peppermint lotion. Rash spares face, chest, and distal lower extremities. Upcoming appt with dermatology July 9th for ongoing itchy rash.   Preventative: COLONOSCOPY 08/2018 - Diminutive adenoma, no f/u planned [facility]) Prostate cancer screening - monitoring yearly PSA. No fmhx prostate cancer. Nocturia x3-4.  Lung cancer screening - ~25 PY hx. Quit ~2015, smoked late teens to 22s. Undergoing yearly lung cancer screening, latest 12/2022 Flu shot - yearly COVID vaccine Moderna 08/2019, 09/2019, booster 07/2020, latest 05/2022 Tdap - 2012 Prevnar-13 11/2013, pneumovax 2016, 10/2020 - s/p splenectomy will need Q5 yrs  Shingrix - 04/2021, 10/2021 Advanced directives - has this at home. Daughter to be HCPOA. Asked to bring [place] copy for chart.  Seat belt use discussed.  Sunscreen use discussed. No changing moles on skin.  Ex smoker - quit ~2015. ~20 PY hx.  Alcohol - none  Dentist - Q6 months  Eye exam - yearly  Bowel -  no constipation  Bladder - no incontinence   Lives with wife, no pets Occupation: retired, Education officer, environmental Edu: Degree Activity: housework, no regular exercise  Diet: good water, fruits/vegetables daily      Relevant past medical, surgical, family and social history reviewed and updated as indicated. Interim medical history since our last visit reviewed. Allergies and medications reviewed and updated. Outpatient Medications Prior to Visit  Medication Sig Dispense Refill   Accu-Chek Softclix Lancets lancets Use as instructed to check blood sugar 2 times a day 200 each 3   albuterol (VENTOLIN HFA) 108 (90 Base) MCG/ACT inhaler INHALE 2 PUFFS BY MOUTH  EVERY 6 HOURS AS NEEDED FOR WHEEZE OR SHORTNESS OF BREATH 8.5 each 0   amLODipine (NORVASC) 10 MG tablet Take 0.5 tablets (5 mg total) by mouth daily.     aspirin EC 81 MG tablet Take 81 mg by mouth every other day.     atenolol (TENORMIN) 100 MG tablet TAKE 1 TABLET (100 MG TOTAL) BY MOUTH DAILY AFTER BREAKFAST. 90 tablet 3   Blood Glucose Monitoring Suppl (ACCU-CHEK GUIDE) w/Device KIT Use as directed to check sugars daily 1 kit 0   Cholecalciferol (VITAMIN D3) 25 MCG (1000 UT) CAPS Take 1 capsule (1,000 Units total) by mouth daily. 30 capsule    glucose blood (ACCU-CHEK GUIDE) test strip 1 each by Other route 2 (two) times daily as needed for other. E11.65. Use as instructed 200 each 3   loratadine (CLARITIN) 10 MG tablet Take 1 tablet (10 mg total) by mouth daily. (Patient taking differently: Take 10 mg by mouth daily. As needed)     omeprazole (PRILOSEC) 40 MG capsule TAKE 1 CAPSULE (40 MG TOTAL) BY MOUTH DAILY. 90 capsule 3   rosuvastatin (CRESTOR) 5 MG tablet Take 1 tablet (5 mg total) by mouth daily. 90 tablet 3   sildenafil (REVATIO) 20 MG tablet TAKE 2-5 TABLETS BY MOUTH EACH DAY AS NEEDED (RELATIONS) 30 tablet 3   triamcinolone cream (KENALOG) 0.1 % APPLY TO AFFECTED AREA TWICE A DAY 60 g 0   TURMERIC PO Take by mouth daily.     metFORMIN (GLUCOPHAGE-XR) 500 MG 24 hr tablet Take 1 tablet (500 mg total) by mouth daily with breakfast. (Patient not taking: Reported on 12/23/2022) 30 tablet 6   Accu-Chek FastClix Lancets MISC E11.69 Use as directed to check sugars daily 100 each 1   Insulin Pen Needle 31G X 5 MM MISC Use for daily insulin injection 100 each 1   No facility-administered medications prior to visit.     Per HPI unless specifically indicated in ROS section below Review of Systems  Constitutional:  Negative for activity change, appetite change, chills, fatigue, fever and unexpected weight change.  HENT:  Negative for hearing loss.   Eyes:  Negative for visual disturbance.   Respiratory:  Negative for cough, chest tightness, shortness of breath and wheezing.   Cardiovascular:  Negative for chest pain, palpitations and leg swelling.  Gastrointestinal:  Negative for abdominal distention, abdominal pain, blood in stool, constipation, diarrhea, nausea and vomiting.  Genitourinary:  Negative for difficulty urinating and hematuria.  Musculoskeletal:  Positive for arthralgias and back pain. Negative for myalgias and neck pain.       R leg pain, knee swelling intermittently  Skin:  Negative for rash.  Neurological:  Negative for dizziness, seizures, syncope and headaches.  Hematological:  Negative for adenopathy. Does not bruise/bleed easily.  Psychiatric/Behavioral:  Negative for dysphoric mood. The patient is not nervous/anxious.  Objective:  BP (!) 148/80   Pulse 70   Temp (!) 97.4 F (36.3 C) (Temporal)   Ht 5' 9.5" (1.765 m)   Wt 189 lb 2 oz (85.8 kg)   SpO2 96%   BMI 27.53 kg/m   Wt Readings from Last 3 Encounters:  12/23/22 189 lb 2 oz (85.8 kg)  10/26/22 184 lb 9.6 oz (83.7 kg)  10/24/22 190 lb (86.2 kg)      Physical Exam Vitals and nursing note reviewed.  Constitutional:      General: He is not in acute distress.    Appearance: Normal appearance. He is well-developed. He is not ill-appearing.     Comments: Antalgic gait - walks with cane  HENT:     Head: Normocephalic and atraumatic.     Right Ear: Hearing, tympanic membrane, ear canal and external ear normal.     Left Ear: Hearing, tympanic membrane, ear canal and external ear normal.     Mouth/Throat:     Mouth: Mucous membranes are moist.     Pharynx: Oropharynx is clear. No oropharyngeal exudate or posterior oropharyngeal erythema.  Eyes:     General: No scleral icterus.    Extraocular Movements: Extraocular movements intact.     Conjunctiva/sclera: Conjunctivae normal.     Pupils: Pupils are equal, round, and reactive to light.  Neck:     Thyroid: No thyroid mass or thyromegaly.      Vascular: No carotid bruit.  Cardiovascular:     Rate and Rhythm: Normal rate and regular rhythm.     Pulses: Normal pulses.          Radial pulses are 2+ on the right side and 2+ on the left side.     Heart sounds: Normal heart sounds. No murmur heard. Pulmonary:     Effort: Pulmonary effort is normal. No respiratory distress.     Breath sounds: Normal breath sounds. No wheezing, rhonchi or rales.  Abdominal:     General: Bowel sounds are normal. There is no distension.     Palpations: Abdomen is soft. There is no mass.     Tenderness: There is no abdominal tenderness. There is no guarding or rebound.     Hernia: No hernia is present.  Musculoskeletal:        General: Normal range of motion.     Cervical back: Normal range of motion and neck supple.     Right lower leg: No edema.     Left lower leg: No edema.  Lymphadenopathy:     Cervical: No cervical adenopathy.  Skin:    General: Skin is warm and dry.     Findings: No rash.  Neurological:     General: No focal deficit present.     Mental Status: He is alert and oriented to person, place, and time.  Psychiatric:        Mood and Affect: Mood normal.        Behavior: Behavior normal.        Thought Content: Thought content normal.        Judgment: Judgment normal.       Results for orders placed or performed in visit on 12/16/22  Vitamin B12  Result Value Ref Range   Vitamin B-12 >1500 (H) 211 - 911 pg/mL  PSA, Medicare  Result Value Ref Range   PSA 2.61 0.10 - 4.00 ng/ml  T4, free  Result Value Ref Range   Free T4 1.08 0.60 - 1.60 ng/dL  TSH  Result Value Ref Range   TSH 1.26 0.35 - 5.50 uIU/mL  Comprehensive metabolic panel  Result Value Ref Range   Sodium 136 135 - 145 mEq/L   Potassium 4.2 3.5 - 5.1 mEq/L   Chloride 101 96 - 112 mEq/L   CO2 28 19 - 32 mEq/L   Glucose, Bld 164 (H) 70 - 99 mg/dL   BUN 13 6 - 23 mg/dL   Creatinine, Ser 1.61 0.40 - 1.50 mg/dL   Total Bilirubin 0.9 0.2 - 1.2 mg/dL    Alkaline Phosphatase 46 39 - 117 U/L   AST 13 0 - 37 U/L   ALT 11 0 - 53 U/L   Total Protein 7.2 6.0 - 8.3 g/dL   Albumin 4.0 3.5 - 5.2 g/dL   GFR 09.60 >45.40 mL/min   Calcium 9.4 8.4 - 10.5 mg/dL  Lipid panel  Result Value Ref Range   Cholesterol 168 0 - 200 mg/dL   Triglycerides 981.1 0.0 - 149.0 mg/dL   HDL 91.47 >82.95 mg/dL   VLDL 62.1 0.0 - 30.8 mg/dL   LDL Cholesterol 98 0 - 99 mg/dL   Total CHOL/HDL Ratio 3    NonHDL 118.22   Microalbumin / creatinine urine ratio  Result Value Ref Range   Microalb, Ur 0.9 0.0 - 1.9 mg/dL   Creatinine,U 657.8 mg/dL   Microalb Creat Ratio 0.4 0.0 - 30.0 mg/g  Hemoglobin A1c  Result Value Ref Range   Hgb A1c MFr Bld 6.8 (H) 4.6 - 6.5 %   Lab Results  Component Value Date   WBC 7.9 05/21/2022   HGB 15.4 05/21/2022   HCT 45.8 05/21/2022   MCV 92.7 05/21/2022   PLT 303 05/21/2022    Assessment & Plan:   Problem List Items Addressed This Visit     Hypertension - Primary   COPD (chronic obstructive pulmonary disease) (HCC)   GERD (gastroesophageal reflux disease)   Hyperlipidemia associated with type 2 diabetes mellitus (HCC)   Erectile dysfunction of organic origin     No orders of the defined types were placed in this encounter.   No orders of the defined types were placed in this encounter.   There are no Patient Instructions on file for this visit.  Follow up plan: No follow-ups on file.  Eustaquio Boyden, MD

## 2022-12-24 ENCOUNTER — Encounter: Payer: Self-pay | Admitting: Family Medicine

## 2022-12-24 NOTE — Assessment & Plan Note (Signed)
Chronic, thought multifactorial - R hip osteoarthritis, pseudogout, lumbar radiculopathy. ESI (09/2022) was not beneficial. Discussed anticipate predominant issue is hip osteoarthritis. He declined dx/tx hip injection. He currently will continue seeing chiropractor.

## 2022-12-24 NOTE — Assessment & Plan Note (Signed)
He has seen PMR, most recently stopped gabapentin and started seeing chiropractor.

## 2022-12-24 NOTE — Assessment & Plan Note (Signed)
S/p VVS eval 06/2022 - leg pain not due to arterial claudication.  

## 2022-12-24 NOTE — Assessment & Plan Note (Signed)
TFTs remain stable. 

## 2022-12-24 NOTE — Assessment & Plan Note (Signed)
Chronic, stable on daily PPI 

## 2022-12-24 NOTE — Assessment & Plan Note (Addendum)
Chronic, mildly elevated but adequate control - continue atenolol 100mg  daily and amlodipine 10mg  daily. We stopped hydrochlorothiazide and losartan due to concern they were contributing to itchy rash.

## 2022-12-24 NOTE — Assessment & Plan Note (Signed)
Stable period off controller inhaler.  Continue albuterol PRN

## 2022-12-24 NOTE — Assessment & Plan Note (Addendum)
Quit 2015.  Continues yearly lung cancer screening CT scan.

## 2022-12-24 NOTE — Assessment & Plan Note (Signed)
Chronic, diet controlled. He stopped metformin XR - concerned it may worsen pruritus. I asked him to send me picture of ingredients of new diabetes supplement he's started.

## 2022-12-24 NOTE — Assessment & Plan Note (Addendum)
Chronic, stable on crestor 5mg  daily - continue. LDL 98.  The 10-year ASCVD risk score (Arnett DK, et al., 2019) is: 47%   Values used to calculate the score:     Age: 77 years     Sex: Male     Is Non-Hispanic African American: Yes     Diabetic: Yes     Tobacco smoker: No     Systolic Blood Pressure: 148 mmHg     Is BP treated: Yes     HDL Cholesterol: 50.2 mg/dL     Total Cholesterol: 168 mg/dL

## 2022-12-24 NOTE — Assessment & Plan Note (Signed)
Requests both sildenafil 20mg  and 100mg  dose - will price out and fill most affordable option. Requests this sent to [facility].

## 2022-12-24 NOTE — Assessment & Plan Note (Signed)
Continue statin. 

## 2022-12-24 NOTE — Assessment & Plan Note (Signed)
Chronic rash. Has seen dermatology in the past, initially 2015 Terri Piedra) thought drug rash again 2018 - stopped losartan and hydrochlorothiazide with temporary improvement.  ?scabies given pruritus - will Rx permethrin x1.  Rec keep upcoming repeat dermatology evaluation in [facility].

## 2022-12-27 DIAGNOSIS — M9902 Segmental and somatic dysfunction of thoracic region: Secondary | ICD-10-CM | POA: Diagnosis not present

## 2022-12-27 DIAGNOSIS — M62830 Muscle spasm of back: Secondary | ICD-10-CM | POA: Diagnosis not present

## 2022-12-27 DIAGNOSIS — M9903 Segmental and somatic dysfunction of lumbar region: Secondary | ICD-10-CM | POA: Diagnosis not present

## 2022-12-27 DIAGNOSIS — M5431 Sciatica, right side: Secondary | ICD-10-CM | POA: Diagnosis not present

## 2022-12-29 ENCOUNTER — Telehealth: Payer: Self-pay

## 2022-12-29 DIAGNOSIS — M9903 Segmental and somatic dysfunction of lumbar region: Secondary | ICD-10-CM | POA: Diagnosis not present

## 2022-12-29 DIAGNOSIS — M5431 Sciatica, right side: Secondary | ICD-10-CM | POA: Diagnosis not present

## 2022-12-29 DIAGNOSIS — M62830 Muscle spasm of back: Secondary | ICD-10-CM | POA: Diagnosis not present

## 2022-12-29 DIAGNOSIS — M9902 Segmental and somatic dysfunction of thoracic region: Secondary | ICD-10-CM | POA: Diagnosis not present

## 2022-12-29 NOTE — Telephone Encounter (Signed)
Pharmacy Patient Advocate Encounter   Received notification from CoverMyMeds that prior authorization for Sildenafil Citrate is required/requested.   Insurance verification completed.   The patient is insured through  [employer]  .    PA submitted to [employer] via CoverMyMeds Key/confirmation #/EOC [id number] Status is pending

## 2023-01-02 DIAGNOSIS — M62830 Muscle spasm of back: Secondary | ICD-10-CM | POA: Diagnosis not present

## 2023-01-02 DIAGNOSIS — M9902 Segmental and somatic dysfunction of thoracic region: Secondary | ICD-10-CM | POA: Diagnosis not present

## 2023-01-02 DIAGNOSIS — M5431 Sciatica, right side: Secondary | ICD-10-CM | POA: Diagnosis not present

## 2023-01-02 DIAGNOSIS — M9903 Segmental and somatic dysfunction of lumbar region: Secondary | ICD-10-CM | POA: Diagnosis not present

## 2023-01-02 NOTE — Telephone Encounter (Signed)
Pharmacy Patient Advocate Encounter  Received notification from [facility] that Prior Authorization for Sildenafil Citrate has been DENIED because  .[name]  Denial attached to media   Please be advised we currently do not have a Pharmacist to review denials, therefore you will need to process appeals accordingly as needed. Thanks for your support at this time. Contact for appeals are as follows: Phone: [phone], Fax: [phone]

## 2023-01-02 NOTE — Telephone Encounter (Signed)
Pt is aware and Dr. Reece Agar sent rx to [facility] where pt will pay out of pocket.

## 2023-01-04 DIAGNOSIS — M62830 Muscle spasm of back: Secondary | ICD-10-CM | POA: Diagnosis not present

## 2023-01-04 DIAGNOSIS — M9902 Segmental and somatic dysfunction of thoracic region: Secondary | ICD-10-CM | POA: Diagnosis not present

## 2023-01-04 DIAGNOSIS — M9903 Segmental and somatic dysfunction of lumbar region: Secondary | ICD-10-CM | POA: Diagnosis not present

## 2023-01-04 DIAGNOSIS — M5431 Sciatica, right side: Secondary | ICD-10-CM | POA: Diagnosis not present

## 2023-01-10 DIAGNOSIS — L2089 Other atopic dermatitis: Secondary | ICD-10-CM | POA: Diagnosis not present

## 2023-01-11 DIAGNOSIS — M9902 Segmental and somatic dysfunction of thoracic region: Secondary | ICD-10-CM | POA: Diagnosis not present

## 2023-01-11 DIAGNOSIS — M5431 Sciatica, right side: Secondary | ICD-10-CM | POA: Diagnosis not present

## 2023-01-11 DIAGNOSIS — M9903 Segmental and somatic dysfunction of lumbar region: Secondary | ICD-10-CM | POA: Diagnosis not present

## 2023-01-11 DIAGNOSIS — M62830 Muscle spasm of back: Secondary | ICD-10-CM | POA: Diagnosis not present

## 2023-01-12 DIAGNOSIS — M62830 Muscle spasm of back: Secondary | ICD-10-CM | POA: Diagnosis not present

## 2023-01-12 DIAGNOSIS — M5431 Sciatica, right side: Secondary | ICD-10-CM | POA: Diagnosis not present

## 2023-01-12 DIAGNOSIS — M9903 Segmental and somatic dysfunction of lumbar region: Secondary | ICD-10-CM | POA: Diagnosis not present

## 2023-01-12 DIAGNOSIS — M9902 Segmental and somatic dysfunction of thoracic region: Secondary | ICD-10-CM | POA: Diagnosis not present

## 2023-01-18 DIAGNOSIS — M9902 Segmental and somatic dysfunction of thoracic region: Secondary | ICD-10-CM | POA: Diagnosis not present

## 2023-01-18 DIAGNOSIS — M62830 Muscle spasm of back: Secondary | ICD-10-CM | POA: Diagnosis not present

## 2023-01-18 DIAGNOSIS — M5431 Sciatica, right side: Secondary | ICD-10-CM | POA: Diagnosis not present

## 2023-01-18 DIAGNOSIS — M9903 Segmental and somatic dysfunction of lumbar region: Secondary | ICD-10-CM | POA: Diagnosis not present

## 2023-01-22 ENCOUNTER — Other Ambulatory Visit: Payer: Self-pay | Admitting: Family Medicine

## 2023-01-22 DIAGNOSIS — R21 Rash and other nonspecific skin eruption: Secondary | ICD-10-CM

## 2023-01-22 DIAGNOSIS — I10 Essential (primary) hypertension: Secondary | ICD-10-CM

## 2023-01-23 NOTE — Telephone Encounter (Signed)
Kenalog crm Last filled:  11/23/22, #60 g Last OV:  12/23/22, CPE Next OV:  06/23/23, 6 mo DM f/u

## 2023-01-25 ENCOUNTER — Other Ambulatory Visit: Payer: Self-pay | Admitting: Family Medicine

## 2023-01-25 DIAGNOSIS — M5431 Sciatica, right side: Secondary | ICD-10-CM | POA: Diagnosis not present

## 2023-01-25 DIAGNOSIS — L298 Other pruritus: Secondary | ICD-10-CM

## 2023-01-25 DIAGNOSIS — M9902 Segmental and somatic dysfunction of thoracic region: Secondary | ICD-10-CM | POA: Diagnosis not present

## 2023-01-25 DIAGNOSIS — M62830 Muscle spasm of back: Secondary | ICD-10-CM | POA: Diagnosis not present

## 2023-01-25 DIAGNOSIS — M9903 Segmental and somatic dysfunction of lumbar region: Secondary | ICD-10-CM | POA: Diagnosis not present

## 2023-01-25 NOTE — Telephone Encounter (Signed)
Permethrin crm Last filled:  12/26/22, #60 g Last OV:  12/23/22, CPE Next OV:  06/23/23, 6 mo DM f/u

## 2023-02-13 DIAGNOSIS — M9902 Segmental and somatic dysfunction of thoracic region: Secondary | ICD-10-CM | POA: Diagnosis not present

## 2023-02-13 DIAGNOSIS — M62830 Muscle spasm of back: Secondary | ICD-10-CM | POA: Diagnosis not present

## 2023-02-13 DIAGNOSIS — M9903 Segmental and somatic dysfunction of lumbar region: Secondary | ICD-10-CM | POA: Diagnosis not present

## 2023-02-13 DIAGNOSIS — M5431 Sciatica, right side: Secondary | ICD-10-CM | POA: Diagnosis not present

## 2023-02-16 DIAGNOSIS — D485 Neoplasm of uncertain behavior of skin: Secondary | ICD-10-CM | POA: Diagnosis not present

## 2023-02-16 DIAGNOSIS — L309 Dermatitis, unspecified: Secondary | ICD-10-CM | POA: Diagnosis not present

## 2023-02-16 DIAGNOSIS — L2089 Other atopic dermatitis: Secondary | ICD-10-CM | POA: Diagnosis not present

## 2023-02-20 DIAGNOSIS — R238 Other skin changes: Secondary | ICD-10-CM | POA: Diagnosis not present

## 2023-02-28 DIAGNOSIS — L2089 Other atopic dermatitis: Secondary | ICD-10-CM | POA: Diagnosis not present

## 2023-02-28 DIAGNOSIS — L810 Postinflammatory hyperpigmentation: Secondary | ICD-10-CM | POA: Diagnosis not present

## 2023-02-28 DIAGNOSIS — Z79899 Other long term (current) drug therapy: Secondary | ICD-10-CM | POA: Diagnosis not present

## 2023-03-14 DIAGNOSIS — L810 Postinflammatory hyperpigmentation: Secondary | ICD-10-CM | POA: Diagnosis not present

## 2023-03-14 DIAGNOSIS — Z79899 Other long term (current) drug therapy: Secondary | ICD-10-CM | POA: Diagnosis not present

## 2023-03-14 DIAGNOSIS — L2089 Other atopic dermatitis: Secondary | ICD-10-CM | POA: Diagnosis not present

## 2023-03-30 DIAGNOSIS — L2089 Other atopic dermatitis: Secondary | ICD-10-CM | POA: Diagnosis not present

## 2023-03-30 DIAGNOSIS — L810 Postinflammatory hyperpigmentation: Secondary | ICD-10-CM | POA: Diagnosis not present

## 2023-03-30 DIAGNOSIS — Z79899 Other long term (current) drug therapy: Secondary | ICD-10-CM | POA: Diagnosis not present

## 2023-05-06 DIAGNOSIS — Z23 Encounter for immunization: Secondary | ICD-10-CM | POA: Diagnosis not present

## 2023-05-18 DIAGNOSIS — L2089 Other atopic dermatitis: Secondary | ICD-10-CM | POA: Diagnosis not present

## 2023-05-18 DIAGNOSIS — L810 Postinflammatory hyperpigmentation: Secondary | ICD-10-CM | POA: Diagnosis not present

## 2023-05-26 DIAGNOSIS — J302 Other seasonal allergic rhinitis: Secondary | ICD-10-CM | POA: Diagnosis not present

## 2023-05-29 ENCOUNTER — Other Ambulatory Visit: Payer: Self-pay | Admitting: *Deleted

## 2023-05-29 DIAGNOSIS — I739 Peripheral vascular disease, unspecified: Secondary | ICD-10-CM

## 2023-05-29 NOTE — Addendum Note (Signed)
Addended by: Thomasena Edis on: 05/29/2023 12:10 PM   Modules accepted: Orders

## 2023-05-30 DIAGNOSIS — J301 Allergic rhinitis due to pollen: Secondary | ICD-10-CM | POA: Diagnosis not present

## 2023-05-30 DIAGNOSIS — J305 Allergic rhinitis due to food: Secondary | ICD-10-CM | POA: Diagnosis not present

## 2023-06-15 ENCOUNTER — Ambulatory Visit (INDEPENDENT_AMBULATORY_CARE_PROVIDER_SITE_OTHER): Payer: Medicare Other | Admitting: Physician Assistant

## 2023-06-15 ENCOUNTER — Ambulatory Visit (HOSPITAL_COMMUNITY)
Admission: RE | Admit: 2023-06-15 | Discharge: 2023-06-15 | Disposition: A | Discharge status: Home / Self Care | Payer: Medicare Other | Source: Ambulatory Visit | Attending: Vascular Surgery | Admitting: Vascular Surgery

## 2023-06-15 ENCOUNTER — Encounter: Payer: Self-pay | Admitting: Physician Assistant

## 2023-06-15 VITALS — BP 149/79 | HR 82 | Temp 98.5°F | Resp 22 | Ht 69.5 in | Wt 192.4 lb

## 2023-06-15 DIAGNOSIS — I739 Peripheral vascular disease, unspecified: Secondary | ICD-10-CM

## 2023-06-15 LAB — VAS US ABI WITH/WO TBI

## 2023-06-15 NOTE — Progress Notes (Signed)
Office Note     CC:  follow up Requesting Provider:  Eustaquio Boyden, MD  HPI: Gregory Abbott is a 77 y.o. (December 21, 1945) male who presents for routine follow up of PAD. He was seen last December by Dr. Karin Lieu at which time he had presented with RLE pain and abnormal ABIs. Most of his symptoms were felt to be musculoskeletal in nature and more related to his hip. Although he does have risk factors for PAD as a former long time smoker, newly diagnosed diabetic with HTN and HLD. His non invasive studies showed non compressible vessels with normal toe pressures and preserved waveforms.  Today he reports continued right lower extremity pain. He says he was seeing a [facility] and also did some PT but that only seemed to help a little. The pain is in the thigh, knee and along shin. He says sometimes it throbs, sometimes it aches, at other times he has some shooting pains or feeling like " its going to break in half". He says it certainly slows down his walking and ability to do yard work and other things he likes to do. He says he use to go walk 2-3 miles several times a week with his wife and he is not able to tolerate that any more. He now ambulates mostly with a cane to help his balance. He has some trouble going from sitting to ambulating. Takes a few minutes for him to get stable and take some steps. He says he favors his right leg a lot because it is uncomfortable. It also bothers him at night when trying to turn over or move. He does not have any pain in his feet. No numbness or coldness. No non healing wounds. He is medically managed on Aspirin and Statin.   Past Medical History:  Diagnosis Date   Blood transfusion without reported diagnosis    BPH (benign prostatic hypertrophy)    Cataract    Childhood asthma    COPD (chronic obstructive pulmonary disease) (HCC)    Eczema    Ex-smoker    40 PY hx   GERD (gastroesophageal reflux disease)    History of chicken pox    Hypertension     Lower back pain    h/o herniated and bulging discs lower back   Osteoarthritis of neck    Peripheral arterial disease (HCC)    Personal history of colonic polyps 05/07/2013   resected not retrieved   PUD (peptic ulcer disease)    S/P splenectomy    partial   Shortness of breath     Past Surgical History:  Procedure Laterality Date   AAA screen  04/2013   neg for AAA   CATARACT EXTRACTION Left 05/2013   [facility]   CATARACT EXTRACTION W/PHACO Right 10/06/2015   Procedure: CATARACT EXTRACTION PHACO AND INTRAOCULAR LENS PLACEMENT (IOC);  Surgeon: Galen Manila, MD;  Location: [facility] ORS;  Service: Ophthalmology;  Laterality: Right;  [place] AP% CDE fluid pack lot 3 [id number] H exp 04/02/2017   COLONOSCOPY  2009   COLONOSCOPY  05/2013   1 polyp, int/ext hemorrhoids [facility] Pay[facility])   COLONOSCOPY  08/2018   Diminutive adenoma, no f/u planned [facility])   KNEE ARTHROSCOPY     lower extremity arterial doppler  2013   WNL   SPLENECTOMY, PARTIAL  1998   fall down stairs    Social History   Socioeconomic History   Marital status: Married    Spouse name: Not on file   Number of children: 2  Years of education: Not on file   Highest education level: Not on file  Occupational History   Occupation: Retired  Tobacco Use   Smoking status: Former    Current packs/day: 0.00    Average packs/day: 0.5 packs/day for 49.0 years (24.5 ttl pk-yrs)    Types: Cigarettes    Start date: 11/06/1964    Quit date: 11/06/2013    Years since quitting: 9.6   Smokeless tobacco: Never  Vaping Use   Vaping status: Never Used  Substance and Sexual Activity   Alcohol use: Not Currently    Alcohol/week: 2.0 standard drinks of alcohol    Types: 1 Cans of beer, 1 Shots of liquor per week    Comment: occasional   Drug use: No   Sexual activity: Yes  Other Topics Concern   Not on file  Social History Narrative   Lives with wife, no pets   Occupation: retired, Education officer, environmental   Edu: Degree   Activity: housework, no  regular exercise   Diet: good water, fruits/vegetables daily   Social Drivers of Corporate investment banker Strain: Low Risk  (10/24/2022)   Overall Financial Resource Strain (CARDIA)    Difficulty of Paying Living Expenses: Not hard at all  Food Insecurity: No Food Insecurity (10/24/2022)   Hunger Vital Sign    Worried About Running Out of Food in the Last Year: Never true    Ran Out of Food in the Last Year: Never true  Transportation Needs: No Transportation Needs (10/24/2022)   PRAPARE - Administrator, Civil Service (Medical): No    Lack of Transportation (Non-Medical): No  Physical Activity: Sufficiently Active (10/24/2022)   Exercise Vital Sign    Days of Exercise per Week: 7 days    Minutes of Exercise per Session: 60 min  Stress: No Stress Concern Present (10/24/2022)   Gregory Abbott of Occupational Health - Occupational Stress Questionnaire    Feeling of Stress : Not at all  Social Connections: Moderately Integrated (10/24/2022)   Social Connection and Isolation Panel [NHANES]    Frequency of Communication with Friends and Family: More than three times a week    Frequency of Social Gatherings with Friends and Family: More than three times a week    Attends Religious Services: More than 4 times per year    Active Member of [employer] or Organizations: No    Attends Banker Meetings: Never    Marital Status: Married  Catering manager Violence: Not At Risk (10/24/2022)   Humiliation, Afraid, Rape, and Kick questionnaire    Fear of Current or Ex-Partner: No    Emotionally Abused: No    Physically Abused: No    Sexually Abused: No    Family History  Problem Relation Age of Onset   Diabetes Mother    Hypertension Mother    Hyperlipidemia Mother    Heart disease Mother    Stroke Father    Colon cancer Neg Hx    Rectal cancer Neg Hx    Stomach cancer Neg Hx    Esophageal cancer Neg Hx     Current Outpatient Medications  Medication Sig Dispense  Refill   Accu-Chek Softclix Lancets lancets Use as instructed to check blood sugar 2 times a day 200 each 3   albuterol (VENTOLIN HFA) 108 (90 Base) MCG/ACT inhaler INHALE 2 PUFFS BY MOUTH EVERY 6 HOURS AS NEEDED FOR WHEEZE OR SHORTNESS OF BREATH 8.5 each 3   amLODipine (NORVASC) 10 MG tablet  Take 1 tablet (10 mg total) by mouth daily. 90 tablet 4   aspirin EC 81 MG tablet Take 81 mg by mouth every other day.     atenolol (TENORMIN) 100 MG tablet TAKE 1 TABLET BY MOUTH DAILY AFTER BREAKFAST 90 tablet 4   Blood Glucose Monitoring Suppl (ACCU-CHEK GUIDE) w/Device KIT Use as directed to check sugars daily 1 kit 0   Cholecalciferol (VITAMIN D3) 25 MCG (1000 UT) CAPS Take 1 capsule (1,000 Units total) by mouth daily. 30 capsule    clobetasol (TEMOVATE) 0.05 % external solution APPLY THIN LAYER TO AFFECTED AREAS TWICE DAILY AS NEEDED , MIX WITH CERAVE.     glucose blood (ACCU-CHEK GUIDE) test strip 1 each by Other route 2 (two) times daily as needed for other. E11.65. Use as instructed 200 each 3   loratadine (CLARITIN) 10 MG tablet Take 1 tablet (10 mg total) by mouth daily. (Patient taking differently: Take 10 mg by mouth daily. As needed)     omeprazole (PRILOSEC) 40 MG capsule Take 1 capsule (40 mg total) by mouth daily. 90 capsule 4   permethrin (ELIMITE) 5 % cream Apply topically.     rosuvastatin (CRESTOR) 5 MG tablet Take 1 tablet (5 mg total) by mouth daily. 90 tablet 4   sildenafil (REVATIO) 20 MG tablet TAKE 3-5 TABLETS BY MOUTH EACH DAY AS NEEDED (RELATIONS) 30 tablet 11   sildenafil (VIAGRA) 100 MG tablet Take 1 tablet (100 mg total) by mouth daily as needed for erectile dysfunction. 5 tablet 11   triamcinolone cream (KENALOG) 0.1 % APPLY TO AFFECTED AREA TWICE A DAY 60 g 0   TURMERIC PO Take by mouth daily.     No current facility-administered medications for this visit.    Allergies  Allergen Reactions   Penicillins Other (See Comments)    Unknown - as child. ?asthma   Azithromycin  Rash   Hydrochlorothiazide Itching and Rash    Generalized pruritus, worsened rash   Losartan Rash    Itchy diffuse rash throughout body     REVIEW OF SYSTEMS:  [X]  denotes positive finding, [ ]  denotes negative finding Cardiac  Comments:  Chest pain or chest pressure:    Shortness of breath upon exertion:    Short of breath when lying flat:    Irregular heart rhythm:        Vascular    Pain in calf, thigh, or hip brought on by ambulation:    Pain in feet at night that wakes you up from your sleep:     Blood clot in your veins:    Leg swelling:         Pulmonary    Oxygen at home:    Productive cough:     Wheezing:         Neurologic    Sudden weakness in arms or legs:     Sudden numbness in arms or legs:     Sudden onset of difficulty speaking or slurred speech:    Temporary loss of vision in one eye:     Problems with dizziness:         Gastrointestinal    Blood in stool:     Vomited blood:         Genitourinary    Burning when urinating:     Blood in urine:        Psychiatric    Major depression:         Hematologic    Bleeding  problems:    Problems with blood clotting too easily:        Skin    Rashes or ulcers:        Constitutional    Fever or chills:      PHYSICAL EXAMINATION:  Vitals:   06/15/23 0910  BP: (!) 149/79  Pulse: 82  Resp: (!) 22  Temp: 98.5 F (36.9 C)  TempSrc: Temporal  SpO2: 95%  Weight: 192 lb 6.4 oz (87.3 kg)  Height: 5' 9.5" (1.765 m)    General:  WDWN in NAD, very pleasant; vital signs documented above Gait: ambulates with cane, unsteady, favors right leg HENT: WNL, normocephalic Pulmonary: normal non-labored breathing without wheezing Cardiac: regular HR Abdomen: soft, NT, no masses Vascular Exam/Pulses: 2+ femoral pulses, 2+ Dp pulses bilaterally Extremities: without ischemic changes, without Gangrene , without cellulitis; without open wounds;  Musculoskeletal: no muscle wasting or atrophy  Neurologic: A&O X  3 Psychiatric:  The pt has Normal affect.   Non-Invasive Vascular Imaging:   +-------+-----------+-----------+------------+------------+  ABI/TBIToday's ABIToday's TBIPrevious ABIPrevious TBI  +-------+-----------+-----------+------------+------------+  Right [place]         0.77       [place]          0.64          +-------+-----------+-----------+------------+------------+  Left  [place]         0.71       [place]          0.90          +-------+-----------+-----------+------------+------------+  Right great toe pressure: 124 mmHg, biphasic Left great toe pressure: 115 mmHg, biphasic  ASSESSMENT/PLAN:: 77 y.o. male here for follow up for PAD. He continues to have RLE pain that mostly affects him when ambulating but also uncomfortable at rest. He is now using a cane to assist with his ambulation. He does not clearly describe claudication. He does not have rest pain or tissue loss. His ABIs today are essentially unchanged. He continues to have  vessels with medial calcinosis of his arteries with normal toe pressures distally.  Waveforms remain relatively well-preserved. Continue to feel that he would benefit from Orthopedic evaluation and or neuro for possible other underlying causes of his pain. I encourage him to remain as active as he can tolerate.  - Continue Aspirin and Statin - He will follow up in 1 year with ABI   Graceann Congress, PA-C [facility] [phone]  Clinic MD:   Karin Lieu

## 2023-06-22 ENCOUNTER — Other Ambulatory Visit: Payer: Self-pay

## 2023-06-22 DIAGNOSIS — I739 Peripheral vascular disease, unspecified: Secondary | ICD-10-CM

## 2023-06-22 DIAGNOSIS — J305 Allergic rhinitis due to food: Secondary | ICD-10-CM | POA: Diagnosis not present

## 2023-06-23 ENCOUNTER — Encounter: Payer: Self-pay | Admitting: Family Medicine

## 2023-06-23 ENCOUNTER — Ambulatory Visit (INDEPENDENT_AMBULATORY_CARE_PROVIDER_SITE_OTHER): Payer: Medicare Other | Admitting: Family Medicine

## 2023-06-23 VITALS — BP 156/82 | HR 70 | Temp 98.2°F | Ht 69.5 in | Wt 196.0 lb

## 2023-06-23 DIAGNOSIS — E1169 Type 2 diabetes mellitus with other specified complication: Secondary | ICD-10-CM

## 2023-06-23 DIAGNOSIS — I10 Essential (primary) hypertension: Secondary | ICD-10-CM | POA: Diagnosis not present

## 2023-06-23 LAB — POCT GLYCOSYLATED HEMOGLOBIN (HGB A1C): Hemoglobin A1C: 7.5 % — AB (ref 4.0–5.6)

## 2023-06-23 NOTE — Assessment & Plan Note (Addendum)
Chronic, BP above goal.  He notes he's been skipping BP meds occasionally to see if it would help skin rash.  BP log sheet provided to start monitoring at home and I asked him to drop off in 1-2 wks to review readings.  No changes made today.

## 2023-06-23 NOTE — Progress Notes (Signed)
Ph: [phone] Fax: [phone]   Patient ID: Gregory Abbott, male    DOB: [birth year], 77 y.o.   MRN: [id number]  This visit was conducted in person.  BP (!) 156/82 (BP Location: Right Arm, Cuff Size: Large)   Pulse 70   Temp 98.2 F (36.8 C) (Oral)   Ht 5' 9.5" (1.765 m)   Wt 196 lb (88.9 kg)   SpO2 97%   BMI 28.53 kg/m   BP Readings from Last 3 Encounters:  06/23/23 (!) 156/82  06/15/23 (!) 149/79  12/23/22 (!) 148/80   CC: DM f/u visit  Subjective:   HPI: Gregory Abbott is a 77 y.o. male presenting on 06/23/2023 for Medical Management of Chronic Issues (Here for 6 mo DM f/u. )   HTN - Compliant with current antihypertensive regimen of amlodipine 10mg  daily, atenolol 100mg  daily. Thought hydrochlorothiazide and losartan intolerance (itchy rash). Does not check blood pressures at home.  No low blood pressure readings or symptoms of dizziness/syncope. Denies HA, vision changes, CP/tightness, SOB, leg swelling.   Itchy skin rash - established with [facility] Dr Dalbert Garnet in [place] started on clobetasol topical steroid with benefit. He had biopsy which returned showing contact dermatitis - started Dupixent injection but didn't help and may have had allergic reaction so now off of this for the past 6 wks.  Saw allergist for evaluation - pending results.   Chronic R leg/back pain - saw ortho, chiropractor, PT without significant benefit. Planning to re evaluate with ortho 2nd opinion when skin rash is better.   Known PAD - saw [facility] last week with stable evaluation - rec continue aspirin and statin. Planned f/u 1 year with rpt ABIs. No rest pain.   DM - does not regularly check sugars - 181 last week fasting when checked -after he ate cake for his birthday. Previously 150s or lower. Compliant with antihyperglycemic regimen which includes: diet controlled. He stopped creamer in coffee. Denies low sugars or hypoglycemic symptoms. Denies paresthesias, blurry vision.  Last diabetic eye exam 10/2022. Glucometer brand: accuchek. Last foot exam: 01/2022 - DUE. DSME: declined.  Lab Results  Component Value Date   HGBA1C 7.5 (A) 06/23/2023   Diabetic Foot Exam - Simple   Simple Foot Form Diabetic Foot exam was performed with the following findings: Yes 06/23/2023  9:58 AM  Visual Inspection No deformities, no ulcerations, no other skin breakdown bilaterally: Yes Sensation Testing Intact to touch and monofilament testing bilaterally: Yes Pulse Check Posterior Tibialis and Dorsalis pulse intact bilaterally: Yes Comments No claudication symptoms    Lab Results  Component Value Date   MICROALBUR 0.9 12/16/2022         Relevant past medical, surgical, family and social history reviewed and updated as indicated. Interim medical history since our last visit reviewed. Allergies and medications reviewed and updated. Outpatient Medications Prior to Visit  Medication Sig Dispense Refill   Accu-Chek Softclix Lancets lancets Use as instructed to check blood sugar 2 times a day 200 each 3   albuterol (VENTOLIN HFA) 108 (90 Base) MCG/ACT inhaler INHALE 2 PUFFS BY MOUTH EVERY 6 HOURS AS NEEDED FOR WHEEZE OR SHORTNESS OF BREATH 8.5 each 3   amLODipine (NORVASC) 10 MG tablet Take 1 tablet (10 mg total) by mouth daily. 90 tablet 4   aspirin EC 81 MG tablet Take 81 mg by mouth every other day.     atenolol (TENORMIN) 100 MG tablet TAKE 1 TABLET BY MOUTH DAILY AFTER BREAKFAST 90  tablet 4   Blood Glucose Monitoring Suppl (ACCU-CHEK GUIDE) w/Device KIT Use as directed to check sugars daily 1 kit 0   Cholecalciferol (VITAMIN D3) 25 MCG (1000 UT) CAPS Take 1 capsule (1,000 Units total) by mouth daily. 30 capsule    clobetasol (TEMOVATE) 0.05 % external solution APPLY THIN LAYER TO AFFECTED AREAS TWICE DAILY AS NEEDED , MIX WITH CERAVE.     glucose blood (ACCU-CHEK GUIDE) test strip 1 each by Other route 2 (two) times daily as needed for other. E11.65. Use as instructed 200  each 3   loratadine (CLARITIN) 10 MG tablet Take 1 tablet (10 mg total) by mouth daily. (Patient taking differently: Take 10 mg by mouth daily. As needed)     omeprazole (PRILOSEC) 40 MG capsule Take 1 capsule (40 mg total) by mouth daily. 90 capsule 4   rosuvastatin (CRESTOR) 5 MG tablet Take 1 tablet (5 mg total) by mouth daily. 90 tablet 4   sildenafil (REVATIO) 20 MG tablet TAKE 3-5 TABLETS BY MOUTH EACH DAY AS NEEDED (RELATIONS) 30 tablet 11   sildenafil (VIAGRA) 100 MG tablet Take 1 tablet (100 mg total) by mouth daily as needed for erectile dysfunction. 5 tablet 11   triamcinolone cream (KENALOG) 0.1 % APPLY TO AFFECTED AREA TWICE A DAY 60 g 0   TURMERIC PO Take by mouth daily.     permethrin (ELIMITE) 5 % cream Apply topically.     No facility-administered medications prior to visit.     Per HPI unless specifically indicated in ROS section below Review of Systems  Objective:  BP (!) 156/82 (BP Location: Right Arm, Cuff Size: Large)   Pulse 70   Temp 98.2 F (36.8 C) (Oral)   Ht 5' 9.5" (1.765 m)   Wt 196 lb (88.9 kg)   SpO2 97%   BMI 28.53 kg/m   Wt Readings from Last 3 Encounters:  06/23/23 196 lb (88.9 kg)  06/15/23 192 lb 6.4 oz (87.3 kg)  12/23/22 189 lb 2 oz (85.8 kg)      Physical Exam Vitals and nursing note reviewed.  Constitutional:      Appearance: Normal appearance. He is not ill-appearing.  HENT:     Mouth/Throat:     Mouth: Mucous membranes are moist.     Pharynx: Oropharynx is clear. No oropharyngeal exudate or posterior oropharyngeal erythema.  Eyes:     Extraocular Movements: Extraocular movements intact.     Conjunctiva/sclera: Conjunctivae normal.     Pupils: Pupils are equal, round, and reactive to light.  Cardiovascular:     Rate and Rhythm: Normal rate and regular rhythm.     Pulses: Normal pulses.     Heart sounds: Normal heart sounds. No murmur heard. Pulmonary:     Effort: Pulmonary effort is normal. No respiratory distress.     Breath  sounds: Normal breath sounds. No wheezing, rhonchi or rales.  Musculoskeletal:     Cervical back: Normal range of motion and neck supple.     Right lower leg: No edema.     Left lower leg: No edema.     Comments: See HPI for foot exam if done  Skin:    General: Skin is warm and dry.     Findings: No rash.  Neurological:     Mental Status: He is alert.  Psychiatric:        Mood and Affect: Mood normal.        Behavior: Behavior normal.  Results for orders placed or performed in visit on 06/23/23  POCT glycosylated hemoglobin (Hb A1C)   Collection Time: 06/23/23  9:21 AM  Result Value Ref Range   Hemoglobin A1C 7.5 (A) 4.0 - 5.6 %   HbA1c POC (<> result, manual entry)     HbA1c, POC (prediabetic range)     HbA1c, POC (controlled diabetic range)     Lab Results  Component Value Date   NA 136 12/16/2022   CL 101 12/16/2022   K 4.2 12/16/2022   CO2 28 12/16/2022   BUN 13 12/16/2022   CREATININE 0.93 12/16/2022   GFR 79.21 12/16/2022   CALCIUM 9.4 12/16/2022   PHOS 3.1 06/22/2022   ALBUMIN 4.0 12/16/2022   GLUCOSE 164 (H) 12/16/2022   Assessment & Plan:   Problem List Items Addressed This Visit     Hypertension   Chronic, BP above goal.  He notes he's been skipping BP meds occasionally to see if it would help skin rash.  BP log sheet provided to start monitoring at home and I asked him to drop off in 1-2 wks to review readings.  No changes made today.       Type 2 diabetes mellitus with other specified complication (HCC) - Primary   Chronic, diet controlled, deterioration noted and discussed.  Rec renewed efforts to follow low sugar low carb diabetic diet.  Recommend RTC 3 mo lab visit only POC A1c and if persistently high, will start diabetes med.       Relevant Orders   POCT glycosylated hemoglobin (Hb A1C) (Completed)   POCT glycosylated hemoglobin (Hb A1C)     No orders of the defined types were placed in this encounter.   Orders Placed This  Encounter  Procedures   POCT glycosylated hemoglobin (Hb A1C)   POCT glycosylated hemoglobin (Hb A1C)    Standing Status:   Future    Expiration Date:   12/22/2023    Patient Instructions  BP was too high today - limit Abbott /sodium in the diet. Ensure good water, fruits/vegetables. Continue monitoring BP at home (log sheet provided today) and let me know if consistently >140/90.  Sugar control was worse today - work on limiting added sugar and simple carbohydrates in the diet. Work on physical activity as tolerated.  Return in 3 months for lab visit for A1c recheck (fingerstick).  If staying elevated, I will recommend restarting diabetes medication.  Return in 6 months for wellness visit   Follow up plan: Return in about 6 months (around 12/22/2023) for medicare wellness visit.  Eustaquio Boyden, MD

## 2023-06-23 NOTE — Assessment & Plan Note (Signed)
Chronic, diet controlled, deterioration noted and discussed.  Rec renewed efforts to follow low sugar low carb diabetic diet.  Recommend RTC 3 mo lab visit only POC A1c and if persistently high, will start diabetes med.

## 2023-06-23 NOTE — Patient Instructions (Addendum)
BP was too high today - limit salt /sodium in the diet. Ensure good water, fruits/vegetables. Continue monitoring BP at home (log sheet provided today) and let me know if consistently >140/90.  Sugar control was worse today - work on limiting added sugar and simple carbohydrates in the diet. Work on physical activity as tolerated.  Return in 3 months for lab visit for A1c recheck (fingerstick).  If staying elevated, I will recommend restarting diabetes medication.  Return in 6 months for wellness visit

## 2023-07-27 ENCOUNTER — Encounter: Payer: Self-pay | Admitting: Family Medicine

## 2023-07-27 ENCOUNTER — Ambulatory Visit (INDEPENDENT_AMBULATORY_CARE_PROVIDER_SITE_OTHER): Payer: Medicare Other | Admitting: Family Medicine

## 2023-07-27 ENCOUNTER — Ambulatory Visit: Payer: PRIVATE HEALTH INSURANCE | Admitting: Dermatology

## 2023-07-27 VITALS — BP 110/60 | HR 75 | Temp 98.4°F | Ht 69.5 in | Wt 189.1 lb

## 2023-07-27 DIAGNOSIS — E1169 Type 2 diabetes mellitus with other specified complication: Secondary | ICD-10-CM

## 2023-07-27 DIAGNOSIS — M542 Cervicalgia: Secondary | ICD-10-CM

## 2023-07-27 MED ORDER — DICLOFENAC SODIUM 75 MG PO TBEC: 75.0000 mg | DELAYED_RELEASE_TABLET | Freq: Two times a day (BID) | ORAL | 3 refills | Status: DC

## 2023-07-27 MED ORDER — TIZANIDINE HCL 4 MG PO TABS: 4.0000 mg | ORAL_TABLET | Freq: Every evening | ORAL | 2 refills | Status: DC | PRN

## 2023-07-27 NOTE — Patient Instructions (Signed)
Lidocaine 4% cream

## 2023-07-27 NOTE — Progress Notes (Signed)
Gregory Abbott T. Gregory Treptow, MD, CAQ Sports Medicine [facility] [address] [place], [address]  Phone: [phone]  FAX: [phone]  Gregory Abbott - 78 y.o. male  MRN [id number]  Date of Birth: 1946/04/30  Date: 07/27/2023  PCP: Eustaquio Boyden, MD  Referral: Eustaquio Boyden, MD  Chief Complaint  Patient presents with   Neck Pain    Muscle Spasm   Subjective:   Gregory Abbott is a 78 y.o. very pleasant male patient with Body mass index is 27.53 kg/m. who presents with the following:  Vascular surgery patient Patient of Dr. Wynn Banker, h/o ESI  He is a very pleasant patient who is a new patient to me, and he presents predominantly with neck pain, worst in the posterior aspect of the neck, and worse in this aspect on the right side, predominantly  Cervical region.  He also has some pain in the right-sided trapezius as well.  He denies any numbness, tingling, or focal weakness.  No significant history of prior operative intervention.  He has seen Dr. Wynn Banker in the past for lumbar radiculopathy requiring a ESI.  Stiff and at times at night, will give him some problems Feel like bowling ball on his head  Now on the R side Up to the occiput  FOr a week Aspercreme, ice, heat, motrin Tylenol  Lab Results  Component Value Date   HGBA1C 7.5 (A) 06/23/2023     Review of Systems is noted in the HPI, as appropriate  Objective:   BP 110/60 (BP Location: Left Arm, Patient Position: Sitting, Cuff Size: Large)   Pulse 75   Temp 98.4 F (36.9 C) (Temporal)   Ht 5' 9.5" (1.765 m)   Wt 189 lb 2 oz (85.8 kg)   SpO2 96%   BMI 27.53 kg/m   GEN: No acute distress; alert,appropriate. PULM: Breathing comfortably in no respiratory distress PSYCH: Normally interactive.    CERVICAL SPINE EXAM Range of motion: Flexion, extension, lateral bending, and rotation: Roughly 30% loss of motion in all directions compared would be  expected Spurling's: Negative Pain with terminal motion: Yes, all directions tenderness to Spinous Processes: NT SCM: NT Upper paracervical muscles: The patient in the posterior aspect, worse on the right all the way up to the occiput Upper traps: Some tenderness to palpation in the right trapezius C5-T1 intact, sensation and motor   Laboratory and Imaging Data:  Assessment and Plan:     ICD-10-CM   1. Cervicalgia  M54.2     2. Type 2 diabetes mellitus with other specified complication, without long-term current use of insulin (HCC)  E11.69      Neck pain without radiculopathy, focal to the posterior neck into on the right side.  I am going to have him begin protocol from [facility] and begin [facility] style rehab.  Will also do some Voltaren and Zanaflex at night.  Medication Management during today's office visit: Meds ordered this encounter  Medications   diclofenac (VOLTAREN) 75 MG EC tablet    Sig: Take 1 tablet (75 mg total) by mouth 2 (two) times daily.    Dispense:  60 tablet    Refill:  3   tiZANidine (ZANAFLEX) 4 MG tablet    Sig: Take 1 tablet (4 mg total) by mouth at bedtime as needed for muscle spasms.    Dispense:  30 tablet    Refill:  2   There are no discontinued medications.  Orders placed today for conditions  managed today: No orders of the defined types were placed in this encounter.   Disposition: No follow-ups on file.  [employer] speech-to-text software was used for transcription in this dictation.  Possible transcriptional errors can occur using Animal nutritionist.   Signed,  Elpidio Galea. Glenys Snader, MD   Outpatient Encounter Medications as of 07/27/2023  Medication Sig   Accu-Chek Softclix Lancets lancets Use as instructed to check blood sugar 2 times a day   albuterol (VENTOLIN HFA) 108 (90 Base) MCG/ACT inhaler INHALE 2 PUFFS BY MOUTH EVERY 6 HOURS AS NEEDED FOR WHEEZE OR SHORTNESS OF BREATH   amLODipine (NORVASC) 10 MG tablet Take 1 tablet  (10 mg total) by mouth daily.   aspirin EC 81 MG tablet Take 81 mg by mouth every other day.   atenolol (TENORMIN) 100 MG tablet TAKE 1 TABLET BY MOUTH DAILY AFTER BREAKFAST   Blood Glucose Monitoring Suppl (ACCU-CHEK GUIDE) w/Device KIT Use as directed to check sugars daily   Cholecalciferol (VITAMIN D3) 25 MCG (1000 UT) CAPS Take 1 capsule (1,000 Units total) by mouth daily.   clobetasol (TEMOVATE) 0.05 % external solution APPLY THIN LAYER TO AFFECTED AREAS TWICE DAILY AS NEEDED , MIX WITH CERAVE.   diclofenac (VOLTAREN) 75 MG EC tablet Take 1 tablet (75 mg total) by mouth 2 (two) times daily.   glucose blood (ACCU-CHEK GUIDE) test strip 1 each by Other route 2 (two) times daily as needed for other. E11.65. Use as instructed   loratadine (CLARITIN) 10 MG tablet Take 1 tablet (10 mg total) by mouth daily.   omeprazole (PRILOSEC) 40 MG capsule Take 1 capsule (40 mg total) by mouth daily.   rosuvastatin (CRESTOR) 5 MG tablet Take 1 tablet (5 mg total) by mouth daily.   sildenafil (REVATIO) 20 MG tablet TAKE 3-5 TABLETS BY MOUTH EACH DAY AS NEEDED (RELATIONS)   sildenafil (VIAGRA) 100 MG tablet Take 1 tablet (100 mg total) by mouth daily as needed for erectile dysfunction.   tiZANidine (ZANAFLEX) 4 MG tablet Take 1 tablet (4 mg total) by mouth at bedtime as needed for muscle spasms.   triamcinolone cream (KENALOG) 0.1 % APPLY TO AFFECTED AREA TWICE A DAY   TURMERIC PO Take by mouth daily.   No facility-administered encounter medications on file as of 07/27/2023.

## 2023-08-29 ENCOUNTER — Other Ambulatory Visit: Payer: Self-pay | Admitting: Family Medicine

## 2023-08-29 DIAGNOSIS — R21 Rash and other nonspecific skin eruption: Secondary | ICD-10-CM

## 2023-08-29 NOTE — Telephone Encounter (Signed)
 [facility] crm Last filled:  01/23/23, #60 g Last OV:  06/23/23, 6 mo DM f/u Next OV:  12/22/23, CPE

## 2023-08-31 DIAGNOSIS — L2089 Other atopic dermatitis: Secondary | ICD-10-CM | POA: Diagnosis not present

## 2023-09-14 DIAGNOSIS — L2089 Other atopic dermatitis: Secondary | ICD-10-CM | POA: Diagnosis not present

## 2023-09-21 ENCOUNTER — Other Ambulatory Visit: Payer: Medicare Other

## 2023-09-21 DIAGNOSIS — E1169 Type 2 diabetes mellitus with other specified complication: Secondary | ICD-10-CM

## 2023-09-21 LAB — POCT GLYCOSYLATED HEMOGLOBIN (HGB A1C): Hemoglobin A1C: 9.3 % — AB (ref 4.0–5.6)

## 2023-09-26 ENCOUNTER — Telehealth: Payer: Self-pay | Admitting: Family Medicine

## 2023-09-26 NOTE — Telephone Encounter (Signed)
 Copied from CRM [id number]. Topic: General - Other >> Sep 26, 2023  1:28 PM Eunice Blase wrote: Reason for CRM: Pt returning call regarding message which was provided and is scheduled for 10/13/2023. Pt has question please call pt at [phone]

## 2023-09-26 NOTE — Telephone Encounter (Signed)
 Rtn pt's call. He just wanted to confirm OV on 10/13/23 at 9:00.

## 2023-09-28 DIAGNOSIS — L2089 Other atopic dermatitis: Secondary | ICD-10-CM | POA: Diagnosis not present

## 2023-10-12 DIAGNOSIS — L2089 Other atopic dermatitis: Secondary | ICD-10-CM | POA: Diagnosis not present

## 2023-10-13 ENCOUNTER — Ambulatory Visit: Admitting: Family Medicine

## 2023-10-13 ENCOUNTER — Encounter: Payer: Self-pay | Admitting: Family Medicine

## 2023-10-13 VITALS — BP 174/68 | HR 76 | Temp 98.0°F | Ht 69.5 in | Wt 188.0 lb

## 2023-10-13 DIAGNOSIS — I10 Essential (primary) hypertension: Secondary | ICD-10-CM | POA: Diagnosis not present

## 2023-10-13 DIAGNOSIS — E1169 Type 2 diabetes mellitus with other specified complication: Secondary | ICD-10-CM

## 2023-10-13 DIAGNOSIS — Z7984 Long term (current) use of oral hypoglycemic drugs: Secondary | ICD-10-CM

## 2023-10-13 MED ORDER — ATENOLOL 50 MG PO TABS: 50.0000 mg | ORAL_TABLET | Freq: Two times a day (BID) | ORAL | 4 refills | Status: DC

## 2023-10-13 MED ORDER — METFORMIN HCL ER 500 MG PO TB24: 500.0000 mg | ORAL_TABLET | Freq: Two times a day (BID) | ORAL | 3 refills | Status: AC

## 2023-10-13 NOTE — Assessment & Plan Note (Addendum)
 Chronic ,deteriorated control.  However he notes home readings largely well controlled - ?component of white coat hypertension. I did ask him to bring in home BP cuff to next visit to compare with our readings.  In interim, recommend continue amlodipine  10mg  daily and split atenolol to 50mg  BID for hopeful more consistent beta blockade throughout the day.  He will continue monitoring BP regularly at home with his arm cuff and let me know if home readings stay >140/90 prior to CPE to add 3rd antihypertensive.

## 2023-10-13 NOTE — Patient Instructions (Addendum)
 Continue metformin XR 500mg  twice daily.  Change blood pressure medicine to atenolol 50mg  twice daily. Continue amlodipine 10mg  daily.  Continue monitoring blood pressures at home. Let me know if consistently >140/90 to start additional BP medicine.  Return in June for physical, schedule labs 1 week prior.

## 2023-10-13 NOTE — Progress Notes (Signed)
 Ph: [phone] Fax: [phone]   Patient ID: Gregory Abbott, male    DOB: 1945/10/28, 78 y.o.   MRN: [id number]  This visit was conducted in person.  BP (!) 174/68   Pulse 76   Temp 98 F (36.7 C) (Oral)   Ht 5' 9.5" (1.765 m)   Wt 188 lb (85.3 kg)   SpO2 94%   BMI 27.36 kg/m   170/100 on repeat  Notes home BP run 120-130 systolic.   CC: DM f/u visit  Subjective:   HPI: Gregory Abbott is a 78 y.o. male presenting on 10/13/2023 for Medical Management of Chronic Issues (Here for DM f/u.)   Discussed recently discovered [facility] laboratory miscalculation - the UACR calculation in the software of the system was incorrect but the absolute levels of microalbumin and creatinine were correct.  His corrected UACR value has been reviewed, and his value is still in the normal range so no adjustment to therapy is required at this time.   Pruritic skin rash - sees Dr Dalbert Garnet in [facility], latest yesterday. Managing with clobetasol topical steroid. Biopsy showed contact dermatitis, Dupixent injections didn't help. Now on Ebglyss Q2 wk injection with benefit. Also saw allergist for evaluation - told had some allergies to mold, cheese, corn, many grass varieties.   HTN - Compliant with current antihypertensive regimen of amlodipine 10mg  daily, atenolol 100mg  daily. Possible losartan and hydrochlorothiazide intolerance - itchy rash. Does check blood pressures at home: - overall well controlled 120-130s systolic. No low blood pressure readings or symptoms of dizziness/syncope. Denies HA, vision changes, CP/tightness, SOB, leg swelling.   DM - did note elevated sugars to 500 - he restarted metformin XR 500mg  bid and took insulin for 1 wk but has since stopped insulin. Recently sugars running 130-160s postprandial. Notes splurging in sugar intake. Denies low sugars or hypoglycemic symptoms. Denies paresthesias, blurry vision. Last diabetic eye exam 10/2022. Glucometer brand: accu-check  meter. Last foot exam: 06/2023. DSME: declines.  Lab Results  Component Value Date   HGBA1C 9.3 (A) 09/21/2023   Diabetic Foot Exam - Simple   No data filed    Lab Results  Component Value Date   MICROALBUR 0.9 12/16/2022    Known PAD - sees [facility] with stable evaluation - rec continue aspirin and statin. Planned f/u 1 year with rpt ABIs. No rest pain.      Relevant past medical, surgical, family and social history reviewed and updated as indicated. Interim medical history since our last visit reviewed. Allergies and medications reviewed and updated. Outpatient Medications Prior to Visit  Medication Sig Dispense Refill   Accu-Chek Softclix Lancets lancets Use as instructed to check blood sugar 2 times a day 200 each 3   albuterol (VENTOLIN HFA) 108 (90 Base) MCG/ACT inhaler INHALE 2 PUFFS BY MOUTH EVERY 6 HOURS AS NEEDED FOR WHEEZE OR SHORTNESS OF BREATH 8.5 each 3   amLODipine (NORVASC) 10 MG tablet Take 1 tablet (10 mg total) by mouth daily. 90 tablet 4   aspirin EC 81 MG tablet Take 81 mg by mouth every other day.     Blood Glucose Monitoring Suppl (ACCU-CHEK GUIDE) w/Device KIT Use as directed to check sugars daily 1 kit 0   Cholecalciferol (VITAMIN D3) 25 MCG (1000 UT) CAPS Take 1 capsule (1,000 Units total) by mouth daily. 30 capsule    clobetasol (TEMOVATE) 0.05 % external solution APPLY THIN LAYER TO AFFECTED AREAS TWICE DAILY AS NEEDED , MIX WITH CERAVE.  diclofenac (VOLTAREN) 75 MG EC tablet Take 1 tablet (75 mg total) by mouth 2 (two) times daily. 60 tablet 3   EBGLYSS 250 MG/2ML SOAJ injection      glucose blood (ACCU-CHEK GUIDE) test strip 1 each by Other route 2 (two) times daily as needed for other. E11.65. Use as instructed 200 each 3   loratadine (CLARITIN) 10 MG tablet Take 1 tablet (10 mg total) by mouth daily.     omeprazole (PRILOSEC) 40 MG capsule Take 1 capsule (40 mg total) by mouth daily. 90 capsule 4   rosuvastatin (CRESTOR) 5 MG tablet Take 1 tablet (5 mg  total) by mouth daily. 90 tablet 4   sildenafil (REVATIO) 20 MG tablet TAKE 3-5 TABLETS BY MOUTH EACH DAY AS NEEDED (RELATIONS) 30 tablet 11   sildenafil (VIAGRA) 100 MG tablet Take 1 tablet (100 mg total) by mouth daily as needed for erectile dysfunction. 5 tablet 11   tiZANidine (ZANAFLEX) 4 MG tablet Take 1 tablet (4 mg total) by mouth at bedtime as needed for muscle spasms. 30 tablet 2   triamcinolone cream (KENALOG) 0.1 % APPLY TO AFFECTED AREA TWICE A DAY 60 g 0   TURMERIC PO Take by mouth daily.     atenolol (TENORMIN) 100 MG tablet TAKE 1 TABLET BY MOUTH DAILY AFTER BREAKFAST 90 tablet 4   No facility-administered medications prior to visit.     Per HPI unless specifically indicated in ROS section below Review of Systems  Objective:  BP (!) 174/68   Pulse 76   Temp 98 F (36.7 C) (Oral)   Ht 5' 9.5" (1.765 m)   Wt 188 lb (85.3 kg)   SpO2 94%   BMI 27.36 kg/m   Wt Readings from Last 3 Encounters:  10/13/23 188 lb (85.3 kg)  07/27/23 189 lb 2 oz (85.8 kg)  06/23/23 196 lb (88.9 kg)      Physical Exam Vitals and nursing note reviewed.  Constitutional:      Appearance: Normal appearance. He is not ill-appearing.  Eyes:     Extraocular Movements: Extraocular movements intact.     Conjunctiva/sclera: Conjunctivae normal.     Pupils: Pupils are equal, round, and reactive to light.  Cardiovascular:     Rate and Rhythm: Normal rate and regular rhythm.     Pulses: Normal pulses.     Heart sounds: Normal heart sounds. No murmur heard. Pulmonary:     Effort: Pulmonary effort is normal. No respiratory distress.     Breath sounds: Normal breath sounds. No wheezing, rhonchi or rales.  Musculoskeletal:     Right lower leg: No edema.     Left lower leg: No edema.     Comments: See HPI for foot exam if done  Skin:    General: Skin is warm and dry.     Findings: No rash.  Neurological:     Mental Status: He is alert.  Psychiatric:        Mood and Affect: Mood normal.         Behavior: Behavior normal.       Results for orders placed or performed in visit on 09/21/23  POCT glycosylated hemoglobin (Hb A1C)   Collection Time: 09/21/23  8:27 AM  Result Value Ref Range   Hemoglobin A1C 9.3 (A) 4.0 - 5.6 %   HbA1c POC (<> result, manual entry)     HbA1c, POC (prediabetic range)     HbA1c, POC (controlled diabetic range)  Assessment & Plan:   Problem List Items Addressed This Visit     Hypertension   Chronic ,deteriorated control.  However he notes home readings largely well controlled - ?component of white coat hypertension. I did ask him to bring in home BP cuff to next visit to compare with our readings.  In interim, recommend continue amlodipine  10mg  daily and split atenolol to 50mg  BID for hopeful more consistent beta blockade throughout the day.  He will continue monitoring BP regularly at home with his arm cuff and let me know if home readings stay >140/90 prior to CPE to add 3rd antihypertensive.       Relevant Medications   atenolol (TENORMIN) 50 MG tablet   Type 2 diabetes mellitus with other specified complication (HCC) - Primary   Chronic, deteriorated control as evidenced by A1c last month to 9.3%. He has restarted metformin XR 500mg  twice daily - with improvement in sugar control noted. Will continue this, encouraged continue low sugar low carb diabetic diet. He continues to decline referral for diabetes education classes.  Reassess control at CPE in June.       Relevant Medications   metFORMIN (GLUCOPHAGE-XR) 500 MG 24 hr tablet     Meds ordered this encounter  Medications   metFORMIN (GLUCOPHAGE-XR) 500 MG 24 hr tablet    Sig: Take 1 tablet (500 mg total) by mouth 2 (two) times daily with a meal.    Dispense:  180 tablet    Refill:  3   atenolol (TENORMIN) 50 MG tablet    Sig: Take 1 tablet (50 mg total) by mouth 2 (two) times daily.    Dispense:  180 tablet    Refill:  4    Note new dose/sig    No orders of the defined  types were placed in this encounter.   Patient Instructions  Continue metformin XR 500mg  twice daily.  Change blood pressure medicine to atenolol 50mg  twice daily. Continue amlodipine 10mg  daily.  Continue monitoring blood pressures at home. Let me know if consistently >140/90 to start additional BP medicine.  Return in June for physical, schedule labs 1 week prior.   Follow up plan: No follow-ups on file.  Eustaquio Boyden, MD

## 2023-10-13 NOTE — Assessment & Plan Note (Addendum)
 Chronic, deteriorated control as evidenced by A1c last month to 9.3%. He has restarted metformin XR 500mg  twice daily - with improvement in sugar control noted. Will continue this, encouraged continue low sugar low carb diabetic diet. He continues to decline referral for diabetes education classes.  Reassess control at CPE in June.

## 2023-10-18 DIAGNOSIS — Z961 Presence of intraocular lens: Secondary | ICD-10-CM | POA: Diagnosis not present

## 2023-10-18 DIAGNOSIS — T8522XA Displacement of intraocular lens, initial encounter: Secondary | ICD-10-CM | POA: Diagnosis not present

## 2023-10-18 DIAGNOSIS — E119 Type 2 diabetes mellitus without complications: Secondary | ICD-10-CM | POA: Diagnosis not present

## 2023-10-19 LAB — HM DIABETES EYE EXAM

## 2023-10-21 ENCOUNTER — Other Ambulatory Visit: Payer: Self-pay | Admitting: Family Medicine

## 2023-10-25 ENCOUNTER — Ambulatory Visit: Payer: Medicare Other

## 2023-10-25 VITALS — Ht 69.5 in | Wt 188.0 lb

## 2023-10-25 DIAGNOSIS — Z0000 Encounter for general adult medical examination without abnormal findings: Secondary | ICD-10-CM

## 2023-10-25 NOTE — Progress Notes (Signed)
 Subjective:   Gregory Abbott is a 78 y.o. who presents for a Medicare Wellness preventive visit.  Visit Complete: Virtual I connected with  Gregory Abbott on 10/25/23 by a audio enabled telemedicine application and verified that I am speaking with the correct person using two identifiers.  Patient Location: Home  Provider Location: Home Office  I discussed the limitations of evaluation and management by telemedicine. The patient expressed understanding and agreed to proceed.  Vital Signs: Because this visit was a virtual/telehealth visit, some criteria may be missing or patient reported. Any vitals not documented were not able to be obtained and vitals that have been documented are patient reported.  VideoDeclined- This patient declined Librarian, academic. Therefore the visit was completed with audio only.  Persons Participating in Visit: Patient.  AWV Questionnaire: No: Patient Medicare AWV questionnaire was not completed prior to this visit.  Cardiac Risk Factors include: advanced age (>71men, >39 women);male gender;diabetes mellitus;dyslipidemia;hypertension     Objective:    Today's Vitals   10/25/23 1306  Weight: 188 lb (85.3 kg)  Height: 5' 9.5" (1.765 m)   Body mass index is 27.36 kg/m.     10/25/2023    1:25 PM 10/24/2022   11:12 AM 10/18/2021   11:25 AM 10/13/2020   10:30 AM 06/20/2019    2:45 PM 06/15/2018    2:08 PM 05/02/2016    9:40 AM  Advanced Directives  Does Patient Have a Medical Advance Directive? Yes Yes Yes Yes Yes Yes Yes  Type of Estate agent of [place];Living will Healthcare Power of Gregory Abbott;Living will Healthcare Power of Gregory Abbott;Living will Healthcare Power of Gregory Abbott;Living will Healthcare Power of Gregory Abbott;Living will Healthcare Power of Gregory Abbott;Living will Healthcare Power of Gregory Abbott;Living will  Does patient want to make changes to medical advance directive?       No - Patient declined   Copy of Healthcare Power of Attorney in Chart? No - copy requested No - copy requested No - copy requested No - copy requested No - copy requested No - copy requested No - copy requested    Current Medications (verified) Outpatient Encounter Medications as of 10/25/2023  Medication Sig   Accu-Chek Softclix Lancets lancets Use as instructed to check blood sugar 2 times a day   albuterol  (VENTOLIN  HFA) 108 (90 Base) MCG/ACT inhaler INHALE 2 PUFFS BY MOUTH EVERY 6 HOURS AS NEEDED FOR WHEEZE OR SHORTNESS OF BREATH   amLODipine  (NORVASC ) 10 MG tablet Take 1 tablet (10 mg total) by mouth daily.   aspirin  EC 81 MG tablet Take 81 mg by mouth every other day.   atenolol  (TENORMIN ) 50 MG tablet Take 1 tablet (50 mg total) by mouth 2 (two) times daily.   Blood Glucose Monitoring Suppl (ACCU-CHEK GUIDE) w/Device KIT Use as directed to check sugars daily   Cholecalciferol (VITAMIN D3) 25 MCG (1000 UT) CAPS Take 1 capsule (1,000 Units total) by mouth daily.   clobetasol  (TEMOVATE ) 0.05 % external solution APPLY THIN LAYER TO AFFECTED AREAS TWICE DAILY AS NEEDED , MIX WITH CERAVE.   diclofenac  (VOLTAREN ) 75 MG EC tablet Take 1 tablet (75 mg total) by mouth 2 (two) times daily.   EBGLYSS 250 MG/2ML SOAJ injection    glucose blood (ACCU-CHEK GUIDE) test strip 1 each by Other route 2 (two) times daily as needed for other. E11.65. Use as instructed   loratadine  (CLARITIN ) 10 MG tablet Take 1 tablet (10 mg total) by mouth daily.   metFORMIN  (GLUCOPHAGE -XR)  500 MG 24 hr tablet Take 1 tablet (500 mg total) by mouth 2 (two) times daily with a meal.   omeprazole  (PRILOSEC) 40 MG capsule Take 1 capsule (40 mg total) by mouth daily.   rosuvastatin  (CRESTOR ) 5 MG tablet Take 1 tablet (5 mg total) by mouth daily.   sildenafil  (REVATIO ) 20 MG tablet TAKE 3-5 TABLETS BY MOUTH EACH DAY AS NEEDED (RELATIONS)   sildenafil  (VIAGRA ) 100 MG tablet Take 1 tablet (100 mg total) by mouth daily as needed for erectile dysfunction.    tiZANidine  (ZANAFLEX ) 4 MG tablet TAKE 1 TABLET BY MOUTH AT BEDTIME AS NEEDED FOR MUSCLE SPASMS.   triamcinolone  cream (KENALOG ) 0.1 % APPLY TO AFFECTED AREA TWICE A DAY   TURMERIC PO Take by mouth daily.   No facility-administered encounter medications on file as of 10/25/2023.    Allergies (verified) Penicillins, Azithromycin , Hydrochlorothiazide , and Losartan    History: Past Medical History:  Diagnosis Date   Blood transfusion without reported diagnosis    BPH (benign prostatic hypertrophy)    Cataract    Childhood asthma    COPD (chronic obstructive pulmonary disease) (HCC)    Eczema    Ex-smoker    40 PY hx   GERD (gastroesophageal reflux disease)    History of chicken pox    Hypertension    Lower back pain    h/o herniated and bulging discs lower back   Osteoarthritis of neck    Peripheral arterial disease (HCC)    Personal history of colonic polyps 05/07/2013   resected not retrieved   PUD (peptic ulcer disease)    S/P splenectomy    partial   Shortness of breath    Past Surgical History:  Procedure Laterality Date   AAA screen  04/2013   neg for AAA   CATARACT EXTRACTION Left 05/2013   [facility]   CATARACT EXTRACTION W/PHACO Right 10/06/2015   Procedure: CATARACT EXTRACTION PHACO AND INTRAOCULAR LENS PLACEMENT (IOC);  Surgeon: Clair Crews, MD;  Location: [facility] ORS;  Service: Ophthalmology;  Laterality: Right;  US  AP% CDE fluid pack lot 3 [id number] H exp 04/02/2017   COLONOSCOPY  2009   COLONOSCOPY  05/2013   1 polyp, int/ext hemorrhoids [name])   COLONOSCOPY  08/2018   Diminutive adenoma, no f/u planned [name])   KNEE ARTHROSCOPY     lower extremity arterial doppler  2013   WNL   SPLENECTOMY, PARTIAL  1998   fall down stairs   Family History  Problem Relation Age of Onset   Diabetes Mother    Hypertension Mother    Hyperlipidemia Mother    Heart disease Mother    Stroke Father    Colon cancer Neg Hx    Rectal cancer Neg Hx    Stomach  cancer Neg Hx    Esophageal cancer Neg Hx    Social History   Socioeconomic History   Marital status: Married    Spouse name: Not on file   Number of children: 2   Years of education: Not on file   Highest education level: Not on file  Occupational History   Occupation: Retired  Tobacco Use   Smoking status: Former    Current packs/day: 0.00    Average packs/day: 0.5 packs/day for 49.0 years (24.5 ttl pk-yrs)    Types: Cigarettes    Start date: 11/06/1964    Quit date: 11/06/2013    Years since quitting: 9.9   Smokeless tobacco: Never  Vaping Use   Vaping status: Never Used  Substance and Sexual Activity   Alcohol use: Not Currently    Alcohol/week: 2.0 standard drinks of alcohol    Types: 1 Cans of beer, 1 Shots of liquor per week    Comment: occasional   Drug use: No   Sexual activity: Yes  Other Topics Concern   Not on file  Social History Narrative   Lives with wife, no pets   Occupation: retired, Education officer, environmental   Edu: Degree   Activity: housework, no regular exercise   Diet: good water, fruits/vegetables daily   Social Drivers of Corporate investment banker Strain: Low Risk  (10/25/2023)   Overall Financial Resource Strain (CARDIA)    Difficulty of Paying Living Expenses: Not hard at all  Food Insecurity: No Food Insecurity (10/25/2023)   Hunger Vital Sign    Worried About Running Out of Food in the Last Year: Never true    Ran Out of Food in the Last Year: Never true  Transportation Needs: No Transportation Needs (10/25/2023)   PRAPARE - Administrator, Civil Service (Medical): No    Lack of Transportation (Non-Medical): No  Physical Activity: Sufficiently Active (10/25/2023)   Exercise Vital Sign    Days of Exercise per Week: 7 days    Minutes of Exercise per Session: 60 min  Stress: No Stress Concern Present (10/25/2023)   [employer] of Occupational Health - Occupational Stress Questionnaire    Feeling of Stress : Not at all  Social Connections:  Moderately Integrated (10/25/2023)   Social Connection and Isolation Panel [NHANES]    Frequency of Communication with Friends and Family: More than three times a week    Frequency of Social Gatherings with Friends and Family: More than three times a week    Attends Religious Services: More than 4 times per year    Active Member of [employer] or Organizations: No    Attends Engineer, structural: Never    Marital Status: Married    Tobacco Counseling Counseling given: Not Answered    Clinical Intake:  Pre-visit preparation completed: Yes  Pain : No/denies pain     BMI - recorded: 27.36 Nutritional Status: BMI 25 -29 Overweight Nutritional Risks: None Diabetes: Yes CBG done?: No Did pt. bring in CBG monitor from home?: No  Lab Results  Component Value Date   HGBA1C 9.3 (A) 09/21/2023   HGBA1C 7.5 (A) 06/23/2023   HGBA1C 6.8 (H) 12/16/2022     How often do you need to have someone help you when you read instructions, pamphlets, or other written materials from your doctor or pharmacy?: 1 - Never  Interpreter Needed?: No  Comments: lives with wife Information entered by :: B.Asante Ritacco,LPN   Activities of Daily Living     10/25/2023    1:20 PM  In your present state of health, do you have any difficulty performing the following activities:  Hearing? 0  Vision? 0  Difficulty concentrating or making decisions? 0  Walking or climbing stairs? 0  Dressing or bathing? 0  Doing errands, shopping? 0  Preparing Food and eating ? N  Using the Toilet? N  In the past six months, have you accidently leaked urine? N  Do you have problems with loss of bowel control? N  Managing your Medications? N  Managing your Finances? N  Housekeeping or managing your Housekeeping? N    Patient Care Team: Claire Crick, MD as PCP - General (Family Medicine) Clair Crews, MD as Referring Physician (Ophthalmology)  Indicate any  recent Medical Services you may have received  from other than [facility] providers in the past year (date may be approximate).     Assessment:   This is a routine wellness examination for [place].  Hearing/Vision screen Hearing Screening - Comments:: Pt says his hearing is good Vision Screening - Comments:: Pt says his vision is good w/glasses Dr Merrell Abate   Goals Addressed             This Visit's Progress    COMPLETED: Increase physical activity       Starting 06/15/2018, I will continue to walk at least 30 min 2 days per week.      Patient Stated   On track    10/25/23-, I will maintain and continue medications as prescribed.      Patient Stated   On track    10/25/23-, I will continue to walk when weather permits and yardwork.       Depression Screen     10/25/2023    1:12 PM 10/13/2023    9:02 AM 10/24/2022   11:11 AM 08/01/2022    1:18 PM 07/25/2022    9:59 AM 10/18/2021   11:22 AM 10/13/2020   10:33 AM  PHQ 2/9 Scores  PHQ - 2 Score 0 0 0 0 0 0 0  PHQ- 9 Score    0   0    Fall Risk     10/25/2023    1:09 PM 10/13/2023    9:02 AM 10/26/2022    9:47 AM 10/24/2022   11:06 AM 08/01/2022    1:18 PM  Fall Risk   Falls in the past year? 0 0 0 0 0  Number falls in past yr: 0   0 0  Injury with Fall? 0   0 0  Risk for fall due to : No Fall Risks   No Fall Risks   Follow up Education provided;Falls prevention discussed   Falls prevention discussed;Falls evaluation completed;Education provided     MEDICARE RISK AT HOME:  Medicare Risk at Home Any stairs in or around the home?: No If so, are there any without handrails?: No Home free of loose throw rugs in walkways, pet beds, electrical cords, etc?: Yes Adequate lighting in your home to reduce risk of falls?: Yes Life alert?: No Use of a cane, walker or w/c?: Yes (cane) Grab bars in the bathroom?: No Shower chair or bench in shower?: Yes Elevated toilet seat or a handicapped toilet?: Yes  TIMED UP AND GO:  Was the test performed?  No  Cognitive Function: 6CIT  completed    10/13/2020   10:35 AM 06/20/2019    2:49 PM 06/15/2018    8:23 AM 05/02/2016    9:43 AM  MMSE - Mini Mental State Exam  Not completed: Refused     Orientation to time  5 5 5   Orientation to Place  5 5 5   Registration  3 3 3   Attention/ Calculation  5 0 0  Recall  3 3 3   Language- name 2 objects   0 0  Language- repeat  1 1 1   Language- follow 3 step command   3 3  Language- read & follow direction   0 0  Write a sentence   0 0  Copy design   0 0  Total score   20 20        10/25/2023    1:15 PM 10/24/2022   11:15 AM 10/18/2021   11:28 AM  6CIT Screen  What Year? 0 points 0 points 0 points  What month? 0 points 0 points 0 points  What time? 0 points 0 points 0 points  Count back from 20 0 points 0 points 0 points  Months in reverse 0 points 0 points 0 points  Repeat phrase 0 points 0 points 0 points  Total Score 0 points 0 points 0 points    Immunizations Immunization History  Administered Date(s) Administered   Fluad Quad(high Dose 65+) 04/29/2020, 04/12/2022   Influenza, High Dose Seasonal PF 04/05/2017, 04/03/2019, 04/08/2021   Influenza,inj,Quad PF,6+ Mos 03/30/2015, 05/02/2016, 04/20/2018   Influenza-Unspecified 04/03/2014, 05/04/2014, 05/06/2023   Moderna Sars-Covid-2 Vaccination 08/16/2019, 09/16/2019, 07/24/2020   Pfizer(Comirnaty)Fall Seasonal Vaccine 12 years and older 05/06/2022, 05/06/2023   Pneumococcal Conjugate-13 11/14/2013   Pneumococcal Polysaccharide-23 04/03/2015, 10/20/2020   Tdap 01/31/2011   Zoster Recombinant(Shingrix) 04/26/2021, 10/23/2021    Screening Tests Health Maintenance  Topic Date Due   DTaP/Tdap/Td (2 - Td or Tdap) 01/30/2021   Colonoscopy  08/16/2023   COVID-19 Vaccine (6 - Moderna risk 2024-25 season) 11/03/2023   Lung Cancer Screening  12/16/2023   Diabetic kidney evaluation - eGFR measurement  12/16/2023   Diabetic kidney evaluation - Urine ACR  12/16/2023   INFLUENZA VACCINE  02/02/2024   HEMOGLOBIN A1C   03/23/2024   FOOT EXAM  06/22/2024   OPHTHALMOLOGY EXAM  10/18/2024   Medicare Annual Wellness (AWV)  10/24/2024   Pneumonia Vaccine 9+ Years old  Completed   Hepatitis C Screening  Completed   Zoster Vaccines- Shingrix  Completed   HPV VACCINES  Aged Out   Meningococcal B Vaccine  Aged Out    Health Maintenance  Health Maintenance Due  Topic Date Due   DTaP/Tdap/Td (2 - Td or Tdap) 01/30/2021   Colonoscopy  08/16/2023   Health Maintenance Items Addressed: None needed  Additional Screening:  Vision Screening: Recommended annual ophthalmology exams for early detection of glaucoma and other disorders of the eye.  Dental Screening: Recommended annual dental exams for proper oral hygiene  Community Resource Referral / Chronic Care Management: CRR required this visit?  No   CCM required this visit?  No    Plan:     I have personally reviewed and noted the following in the patient's chart:   Medical and social history Use of alcohol, tobacco or illicit drugs  Current medications and supplements including opioid prescriptions. Patient is not currently taking opioid prescriptions. Functional ability and status Nutritional status Physical activity Advanced directives List of other physicians Hospitalizations, surgeries, and ER visits in previous 12 months Vitals Screenings to include cognitive, depression, and falls Referrals and appointments  In addition, I have reviewed and discussed with patient certain preventive protocols, quality metrics, and best practice recommendations. A written personalized care plan for preventive services as well as general preventive health recommendations were provided to patient.    Nerissa Bannister, LPN   12/06/5407   After Visit Summary: (MyChart) Due to this being a telephonic visit, the after visit summary with patients personalized plan was offered to patient via MyChart   Notes: Nothing significant to report at this time.

## 2023-10-25 NOTE — Patient Instructions (Signed)
 Mr. Mears , Thank you for taking time to come for your Medicare Wellness Visit. I appreciate your ongoing commitment to your health goals. Please review the following plan we discussed and let me know if I can assist you in the future.   Referrals/Orders/Follow-Ups/Clinician Recommendations: none  This is a list of the screening recommended for you and due dates:  Health Maintenance  Topic Date Due   DTaP/Tdap/Td vaccine (2 - Td or Tdap) 01/30/2021   Colon Cancer Screening  08/16/2023   COVID-19 Vaccine (6 - Moderna risk 2024-25 season) 11/03/2023   Screening for Lung Cancer  12/16/2023   Yearly kidney function blood test for diabetes  12/16/2023   Yearly kidney health urinalysis for diabetes  12/16/2023   Flu Shot  02/02/2024   Hemoglobin A1C  03/23/2024   Complete foot exam   06/22/2024   Eye exam for diabetics  10/18/2024   Medicare Annual Wellness Visit  10/24/2024   Pneumonia Vaccine  Completed   Hepatitis C Screening  Completed   Zoster (Shingles) Vaccine  Completed   HPV Vaccine  Aged Out   Meningitis B Vaccine  Aged Out    Advanced directives: (Copy Requested) Please bring a copy of your health care power of attorney and living will to the office to be added to your chart at your convenience. You can mail to [facility] [address] [place], [place] [address] or email to [email or web address]  Next Medicare Annual Wellness Visit scheduled for next year: Yes 10/25/24 @ 1pm televisit

## 2023-11-09 ENCOUNTER — Telehealth: Payer: Self-pay

## 2023-11-09 NOTE — Telephone Encounter (Signed)
 Patient was identified as falling into the [place] Measure - Diabetes.   Patient was: Appointment already scheduled for:  12/22/2023.

## 2023-11-16 DIAGNOSIS — L2089 Other atopic dermatitis: Secondary | ICD-10-CM | POA: Diagnosis not present

## 2023-11-26 IMAGING — DX DG TIBIA/FIBULA 2V*R*
4 series · 4 of 4 positions shown · non-contrast
Comparison: None.

CLINICAL DATA: Pain and stiffness in right leg for 3 months, no
injury

EXAM:
RIGHT TIBIA AND FIBULA - 2 VIEW; RIGHT FEMUR 2 VIEWS

[tibia ap (1 of 2)]
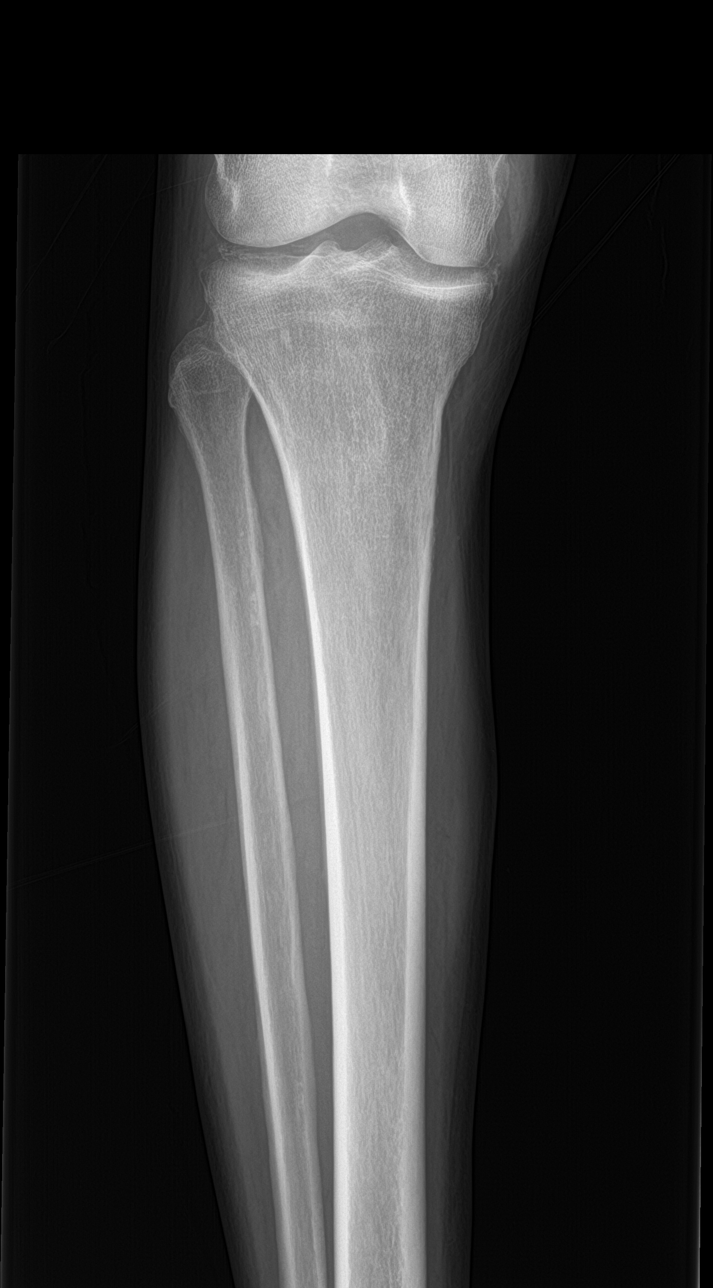

[tibia ap (2 of 2)]
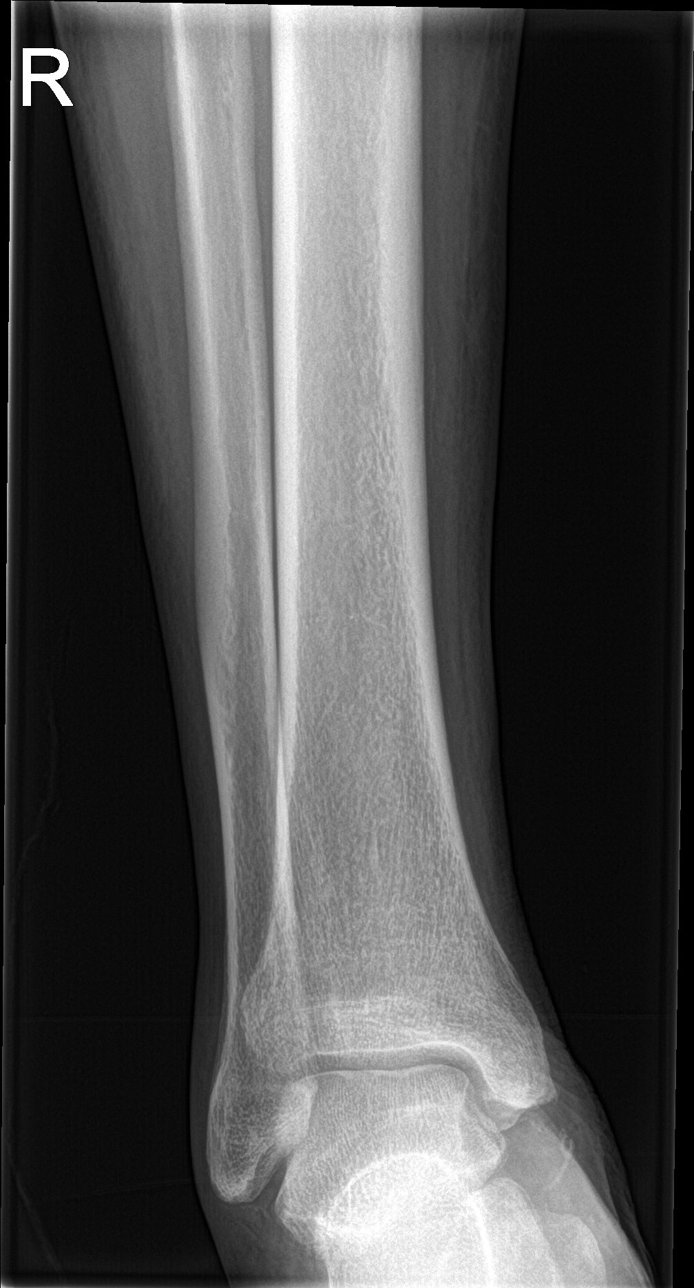

[tibia lat (1 of 2)]
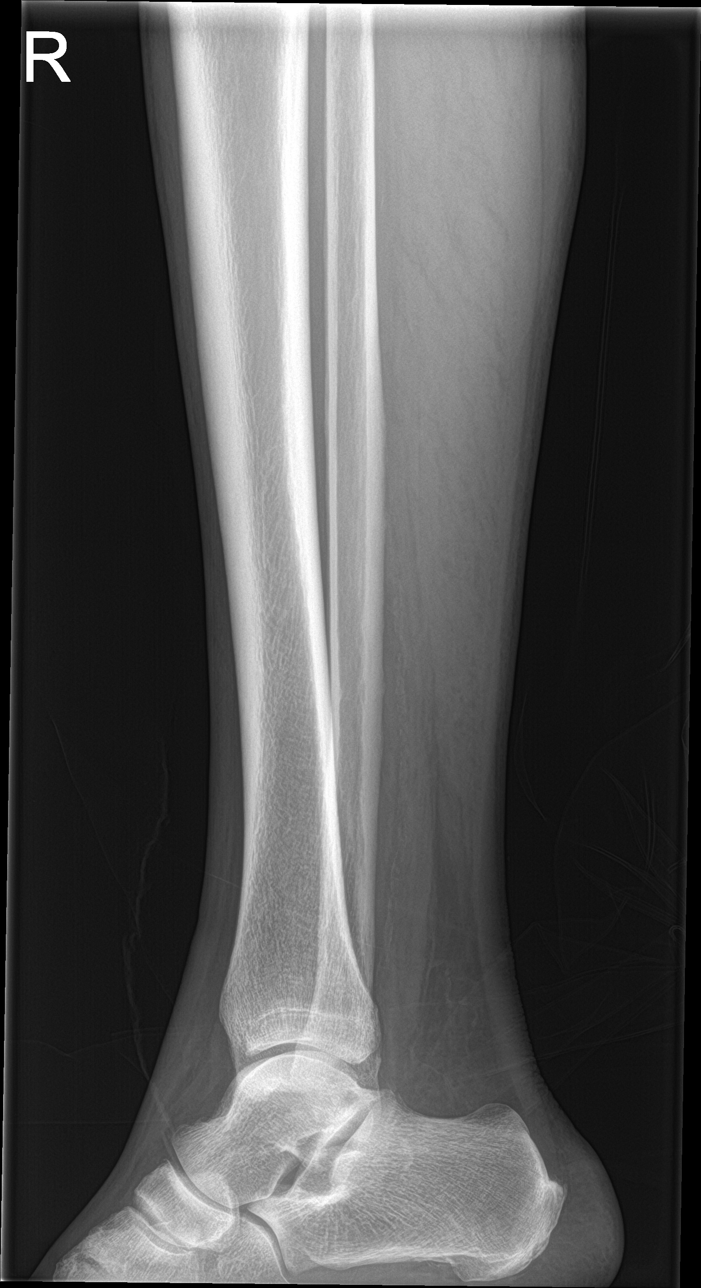

[tibia lat (2 of 2)]
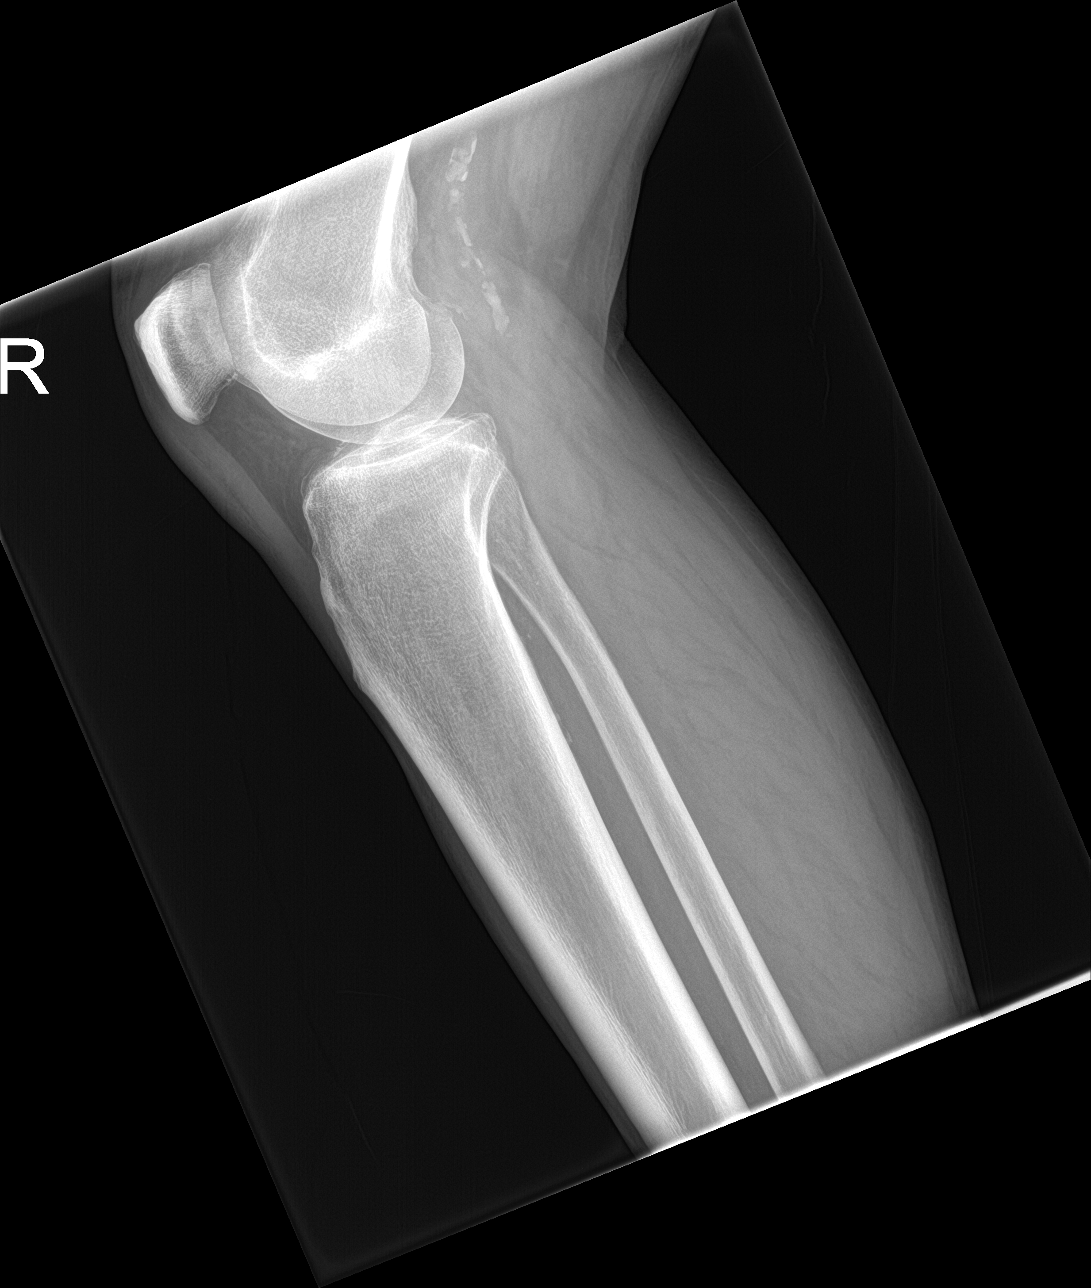

[4 of 4 positions shown; findings below may reference images not displayed]

FINDINGS: No acute fracture or dislocation. Severe degenerative changes in the
right, with significant joint space loss, subchondral sclerosis, and
osteophyte formation. Less significant degenerative changes in the
knee, with chondrocalcinosis and mild spurring. Vascular
calcifications. Soft tissues are otherwise unremarkable.
IMPRESSION: No acute osseous abnormality. Degenerative changes, which are severe
in the right hip.

## 2023-11-26 IMAGING — DX DG FEMUR 2+V*R*
4 series · 4 of 4 positions shown · non-contrast
Comparison: None.

CLINICAL DATA: Pain and stiffness in right leg for 3 months, no
injury

EXAM:
RIGHT TIBIA AND FIBULA - 2 VIEW; RIGHT FEMUR 2 VIEWS

[femur ap (1 of 2)]
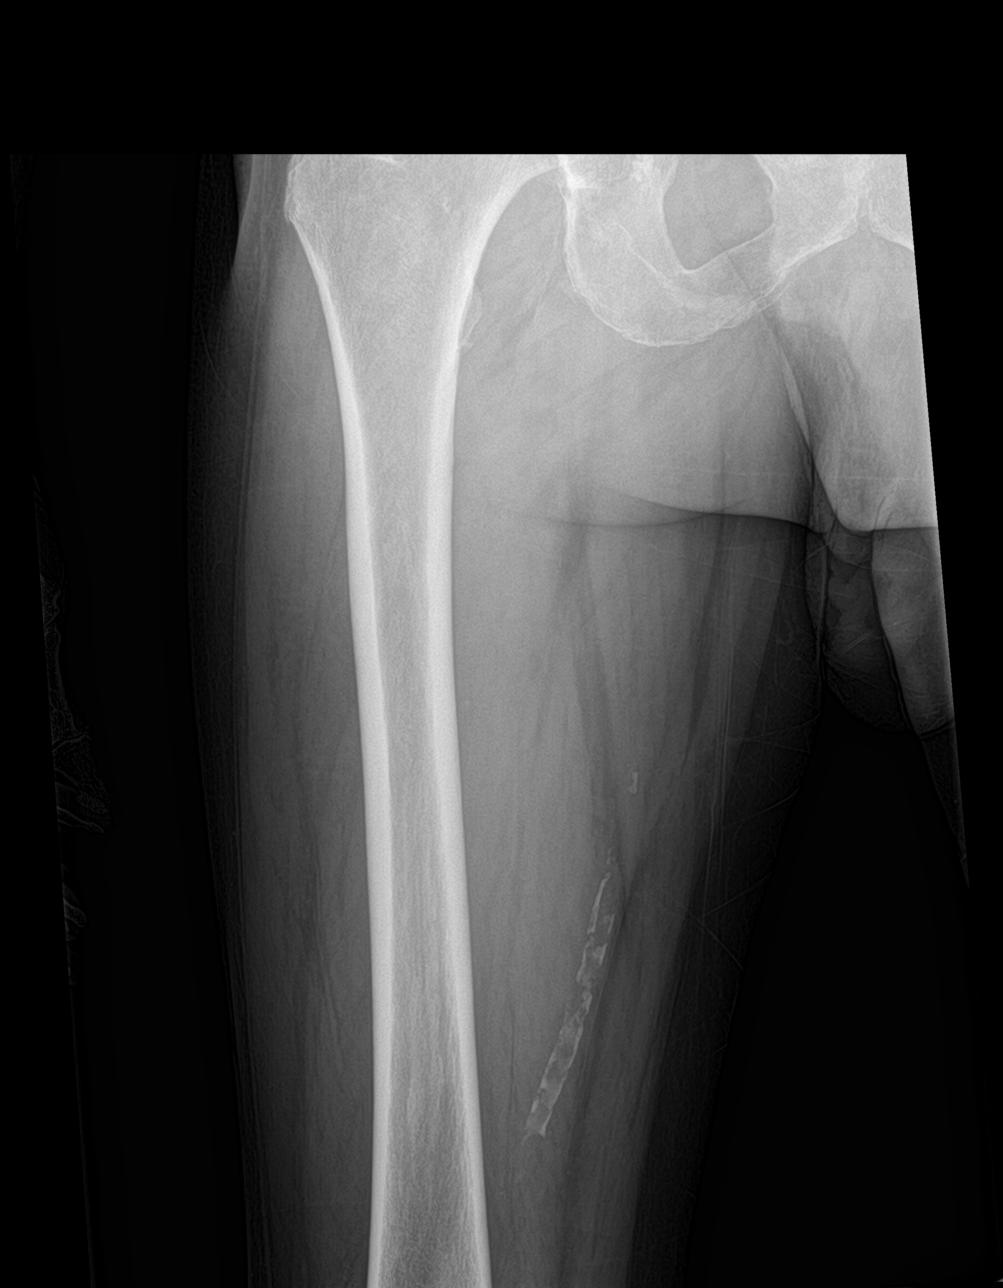

[femur ap (2 of 2)]
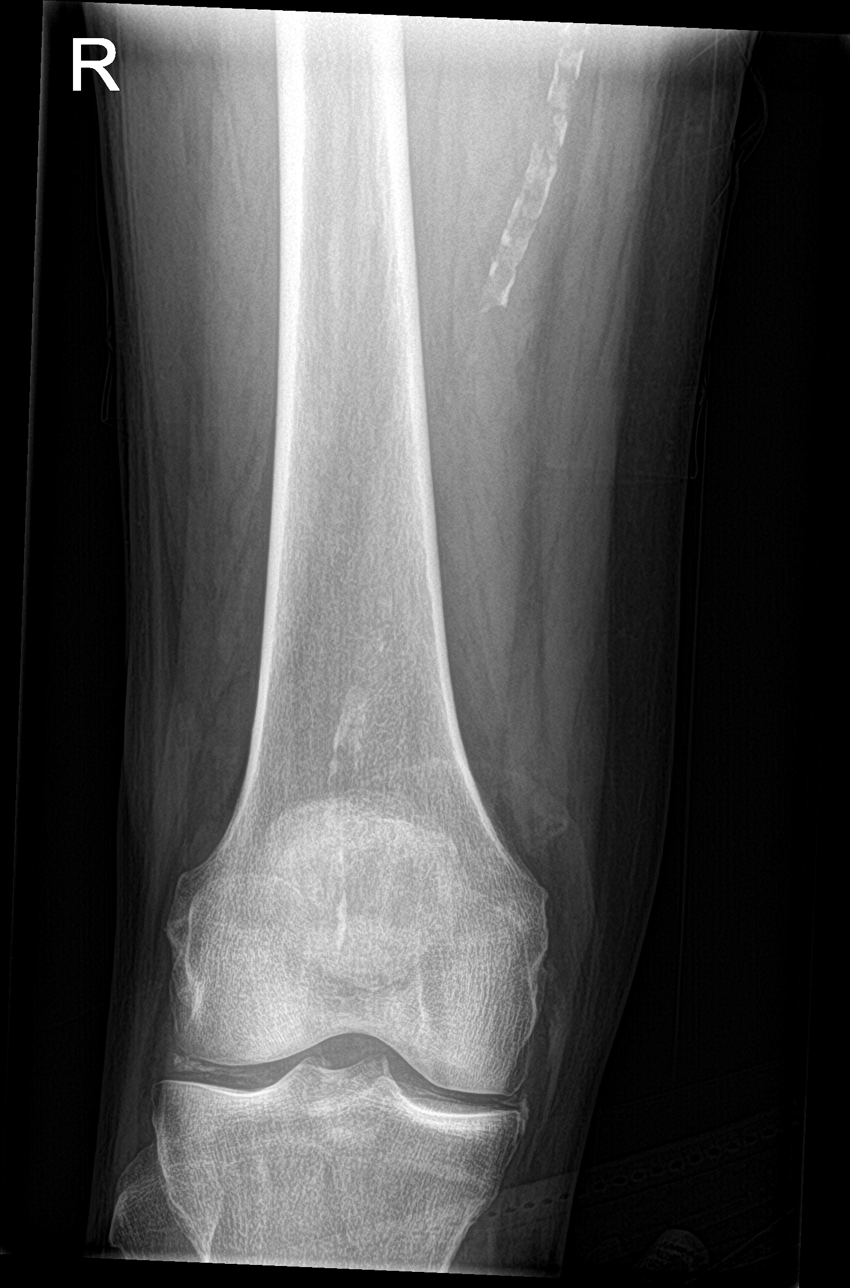

[femur lat (1 of 2)]
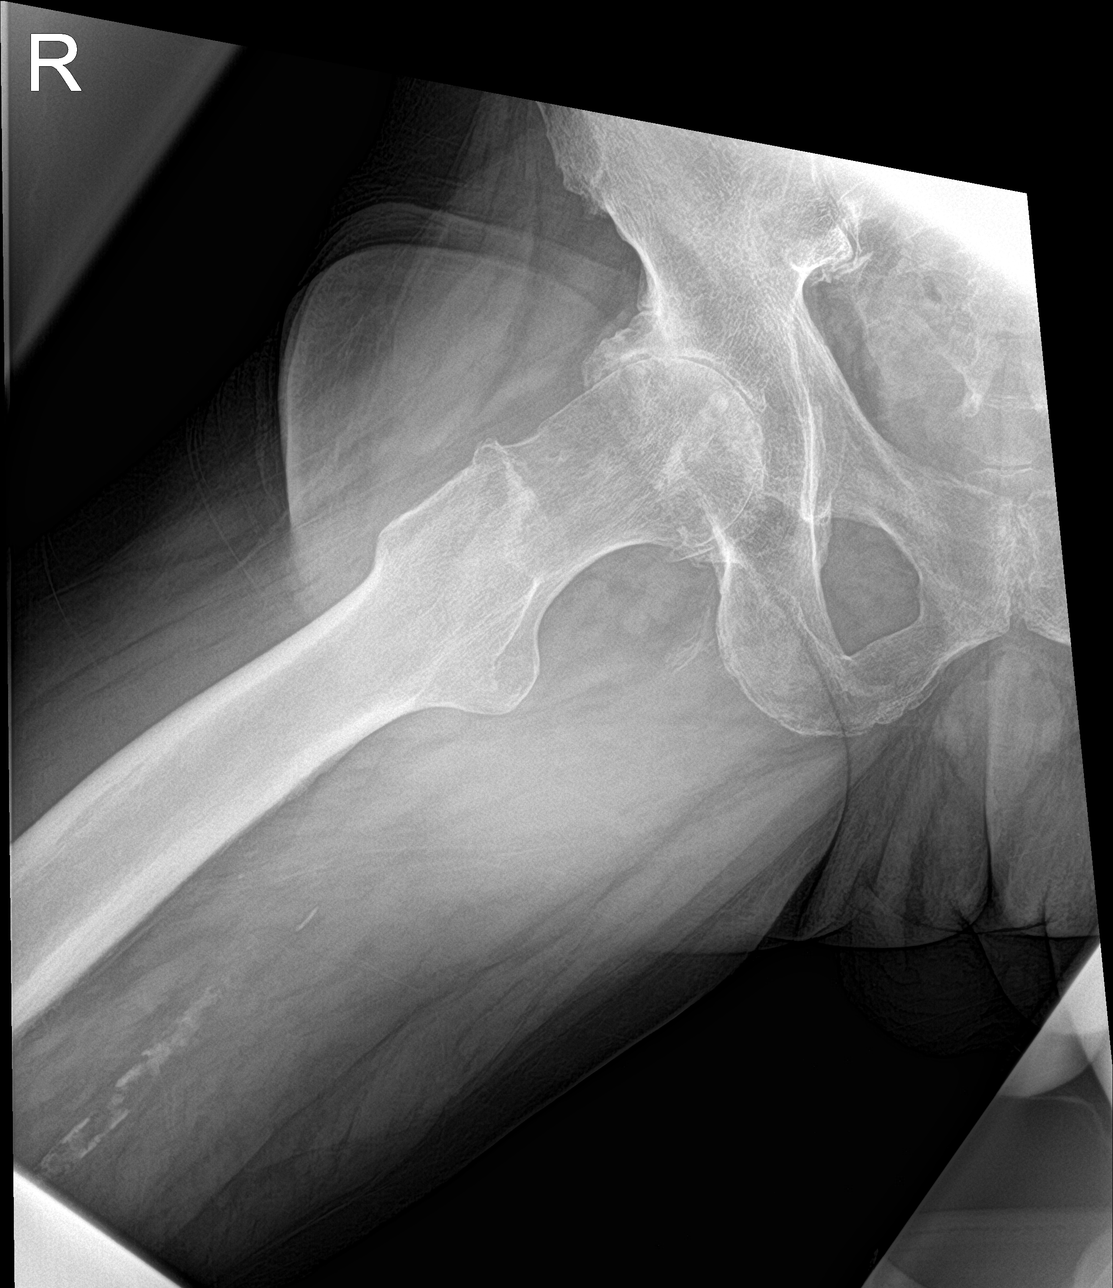

[femur lat (2 of 2)]
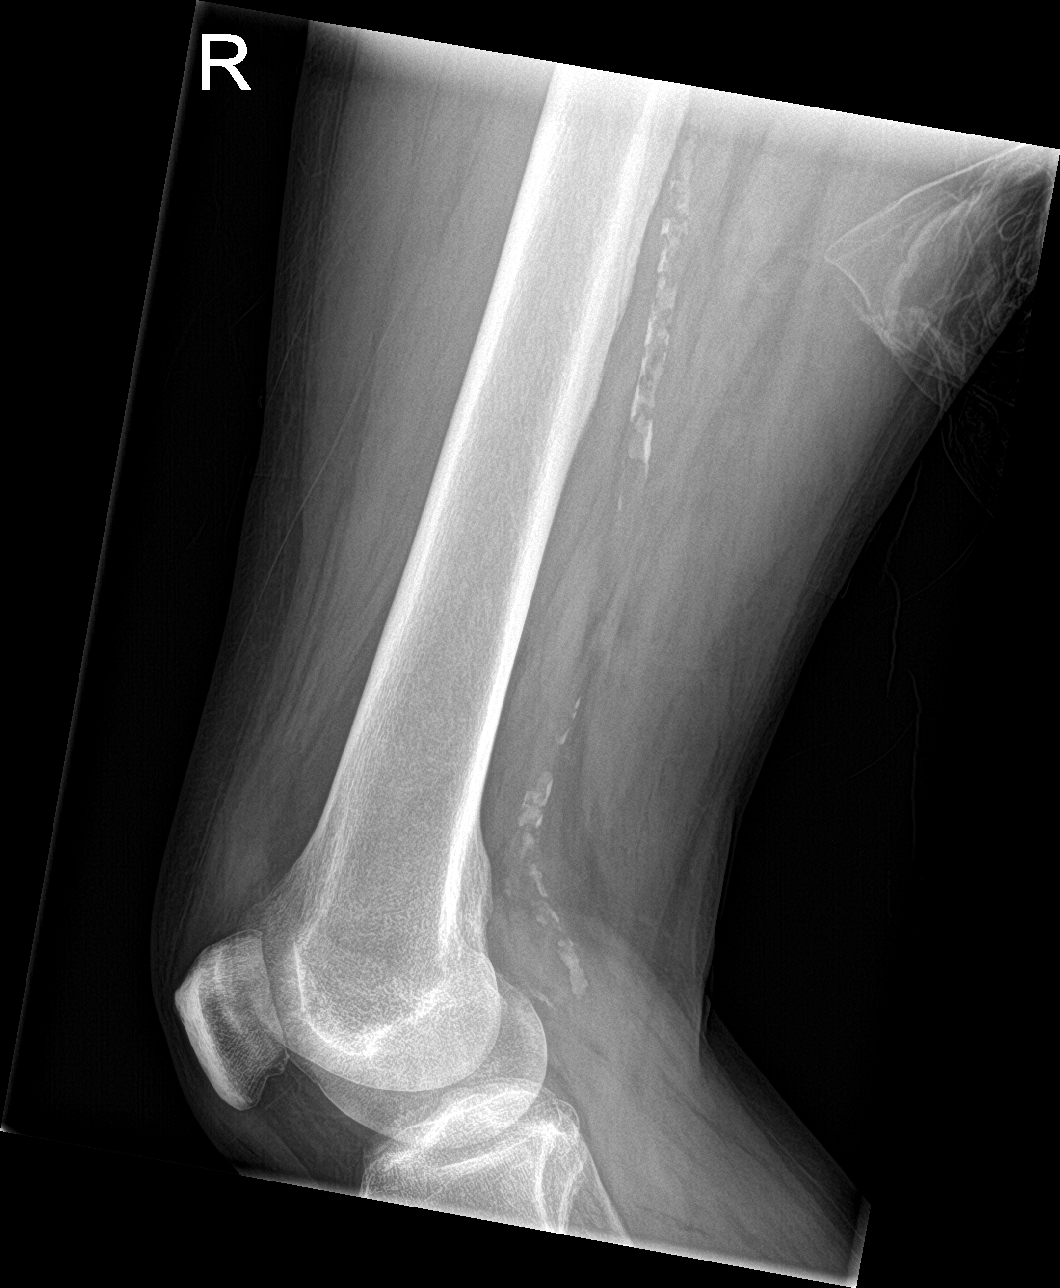

[4 of 4 positions shown; findings below may reference images not displayed]

FINDINGS: No acute fracture or dislocation. Severe degenerative changes in the
right, with significant joint space loss, subchondral sclerosis, and
osteophyte formation. Less significant degenerative changes in the
knee, with chondrocalcinosis and mild spurring. Vascular
calcifications. Soft tissues are otherwise unremarkable.
IMPRESSION: No acute osseous abnormality. Degenerative changes, which are severe
in the right hip.

## 2023-12-14 ENCOUNTER — Other Ambulatory Visit: Payer: Self-pay | Admitting: Family Medicine

## 2023-12-14 DIAGNOSIS — E1169 Type 2 diabetes mellitus with other specified complication: Secondary | ICD-10-CM

## 2023-12-14 DIAGNOSIS — E0590 Thyrotoxicosis, unspecified without thyrotoxic crisis or storm: Secondary | ICD-10-CM

## 2023-12-15 ENCOUNTER — Other Ambulatory Visit (INDEPENDENT_AMBULATORY_CARE_PROVIDER_SITE_OTHER)

## 2023-12-15 ENCOUNTER — Telehealth: Payer: Self-pay | Admitting: Family Medicine

## 2023-12-15 DIAGNOSIS — E0590 Thyrotoxicosis, unspecified without thyrotoxic crisis or storm: Secondary | ICD-10-CM | POA: Diagnosis not present

## 2023-12-15 DIAGNOSIS — E1169 Type 2 diabetes mellitus with other specified complication: Secondary | ICD-10-CM

## 2023-12-15 DIAGNOSIS — M5416 Radiculopathy, lumbar region: Secondary | ICD-10-CM

## 2023-12-15 DIAGNOSIS — M5441 Lumbago with sciatica, right side: Secondary | ICD-10-CM

## 2023-12-15 DIAGNOSIS — M1611 Unilateral primary osteoarthritis, right hip: Secondary | ICD-10-CM

## 2023-12-15 DIAGNOSIS — E785 Hyperlipidemia, unspecified: Secondary | ICD-10-CM | POA: Diagnosis not present

## 2023-12-15 NOTE — Addendum Note (Signed)
 Addended by: Gerry Krone on: 12/15/2023 07:50 AM   Modules accepted: Orders

## 2023-12-15 NOTE — Telephone Encounter (Signed)
 Seen at lab draw today - he requests [place] ortho evaluation for chronic R leg pain -referral placed.  May send OV from 12/2022.

## 2023-12-16 ENCOUNTER — Ambulatory Visit: Payer: Self-pay | Admitting: Family Medicine

## 2023-12-16 LAB — COMPREHENSIVE METABOLIC PANEL WITH GFR
AG Ratio: 1.1 (calc) (ref 1.0–2.5)
ALT: 8 U/L — ABNORMAL LOW (ref 9–46)
AST: 13 U/L (ref 10–35)
Albumin: 3.9 g/dL (ref 3.6–5.1)
Alkaline phosphatase (APISO): 57 U/L (ref 35–144)
BUN: 17 mg/dL (ref 7–25)
CO2: 25 mmol/L (ref 20–32)
Calcium: 9.4 mg/dL (ref 8.6–10.3)
Chloride: 101 mmol/L (ref 98–110)
Creat: 1.1 mg/dL (ref 0.70–1.28)
Globulin: 3.7 g/dL (ref 1.9–3.7)
Glucose, Bld: 184 mg/dL — ABNORMAL HIGH (ref 65–99)
Potassium: 4.3 mmol/L (ref 3.5–5.3)
Sodium: 138 mmol/L (ref 135–146)
Total Bilirubin: 0.5 mg/dL (ref 0.2–1.2)
Total Protein: 7.6 g/dL (ref 6.1–8.1)
eGFR: 69 mL/min/{1.73_m2} (ref >=60)

## 2023-12-16 LAB — LIPID PANEL
Cholesterol: 144 mg/dL (ref <200)
HDL: 46 mg/dL (ref >=40)
LDL Cholesterol (Calc): 82 mg/dL
Non-HDL Cholesterol (Calc): 98 mg/dL (ref <130)
Total CHOL/HDL Ratio: 3.1 (calc) (ref <5.0)
Triglycerides: 80 mg/dL (ref <150)

## 2023-12-16 LAB — MICROALBUMIN / CREATININE URINE RATIO
Creatinine, Urine: 190 mg/dL (ref 20–320)
Microalb Creat Ratio: 2 mg/g{creat} (ref <30)
Microalb, Ur: 0.3 mg/dL

## 2023-12-16 LAB — TSH: TSH: 2.03 m[IU]/L (ref 0.40–4.50)

## 2023-12-16 LAB — VITAMIN B12: Vitamin B-12: 683 pg/mL (ref 200–1100)

## 2023-12-16 LAB — T4, FREE: Free T4: 1.3 ng/dL (ref 0.8–1.8)

## 2023-12-16 LAB — HEMOGLOBIN A1C
Hgb A1c MFr Bld: 7 % — ABNORMAL HIGH (ref <5.7)
Mean Plasma Glucose: 154 mg/dL
eAG (mmol/L): 8.5 mmol/L

## 2023-12-16 LAB — T3: T3, Total: 106 ng/dL (ref 76–181)

## 2023-12-22 ENCOUNTER — Ambulatory Visit: Payer: Medicare Other | Admitting: Family Medicine

## 2023-12-22 ENCOUNTER — Encounter: Payer: Self-pay | Admitting: Family Medicine

## 2023-12-22 VITALS — BP 134/64 | HR 75 | Temp 98.0°F | Ht 70.0 in | Wt 184.4 lb

## 2023-12-22 DIAGNOSIS — E0590 Thyrotoxicosis, unspecified without thyrotoxic crisis or storm: Secondary | ICD-10-CM | POA: Diagnosis not present

## 2023-12-22 DIAGNOSIS — Z9081 Acquired absence of spleen: Secondary | ICD-10-CM

## 2023-12-22 DIAGNOSIS — G8929 Other chronic pain: Secondary | ICD-10-CM | POA: Diagnosis not present

## 2023-12-22 DIAGNOSIS — L2989 Other pruritus: Secondary | ICD-10-CM

## 2023-12-22 DIAGNOSIS — N529 Male erectile dysfunction, unspecified: Secondary | ICD-10-CM

## 2023-12-22 DIAGNOSIS — J449 Chronic obstructive pulmonary disease, unspecified: Secondary | ICD-10-CM | POA: Diagnosis not present

## 2023-12-22 DIAGNOSIS — I10 Essential (primary) hypertension: Secondary | ICD-10-CM | POA: Diagnosis not present

## 2023-12-22 DIAGNOSIS — I739 Peripheral vascular disease, unspecified: Secondary | ICD-10-CM

## 2023-12-22 DIAGNOSIS — M79604 Pain in right leg: Secondary | ICD-10-CM

## 2023-12-22 DIAGNOSIS — E785 Hyperlipidemia, unspecified: Secondary | ICD-10-CM | POA: Diagnosis not present

## 2023-12-22 DIAGNOSIS — E1169 Type 2 diabetes mellitus with other specified complication: Secondary | ICD-10-CM | POA: Diagnosis not present

## 2023-12-22 DIAGNOSIS — M1611 Unilateral primary osteoarthritis, right hip: Secondary | ICD-10-CM

## 2023-12-22 DIAGNOSIS — K219 Gastro-esophageal reflux disease without esophagitis: Secondary | ICD-10-CM

## 2023-12-22 DIAGNOSIS — M1120 Other chondrocalcinosis, unspecified site: Secondary | ICD-10-CM

## 2023-12-22 DIAGNOSIS — Z7189 Other specified counseling: Secondary | ICD-10-CM

## 2023-12-22 DIAGNOSIS — M5416 Radiculopathy, lumbar region: Secondary | ICD-10-CM

## 2023-12-22 MED ORDER — ROSUVASTATIN CALCIUM 5 MG PO TABS: 5.0000 mg | ORAL_TABLET | Freq: Every day | ORAL | 3 refills | Status: AC

## 2023-12-22 MED ORDER — ATENOLOL 50 MG PO TABS: 50.0000 mg | ORAL_TABLET | Freq: Two times a day (BID) | ORAL | 3 refills | Status: AC

## 2023-12-22 MED ORDER — AMLODIPINE BESYLATE 10 MG PO TABS: 10.0000 mg | ORAL_TABLET | Freq: Every day | ORAL | 3 refills | Status: AC

## 2023-12-22 MED ORDER — SILDENAFIL CITRATE 100 MG PO TABS
100.0000 mg | ORAL_TABLET | Freq: Every day | ORAL | 11 refills | Status: AC | PRN
Start: 2023-12-22 — End: ?

## 2023-12-22 MED ORDER — OMEPRAZOLE 40 MG PO CPDR: 40.0000 mg | DELAYED_RELEASE_CAPSULE | Freq: Every day | ORAL | 3 refills | Status: AC

## 2023-12-22 NOTE — Assessment & Plan Note (Signed)
 Known severe R hip osteoarthritis by films 09/2021.  Will await ortho eval.  Has had PT courses of PT.  Has not had hip steroid injection.

## 2023-12-22 NOTE — Patient Instructions (Addendum)
 Bring us  a copy of your living will.  Limit diclofenac  and other anti inflammatory medicines which are rough on the kidneys. Use omeprazole  only as needed for heartburn. Will await orthopedics evaluation.  Good to see you today Return as needed or in 6 months for diabetes follow up visit.

## 2023-12-22 NOTE — Progress Notes (Unsigned)
 Ph: [phone] Fax: [phone]   Patient ID: Gregory Abbott, male    DOB: 1945-11-26, 78 y.o.   MRN: [id number]  This visit was conducted in person.  BP 134/64   Pulse 75   Temp 98 F (36.7 C) (Oral)   Ht 5' 10 (1.778 m)   Wt 184 lb 6 oz (83.6 kg)   SpO2 98%   BMI 26.46 kg/m    CC: AMW f/u visit  Subjective:   HPI: Gregory Abbott is a 78 y.o. male presenting on 12/22/2023 for Annual Exam (MCR prt 2 [AWV- 10/25/23].)   Saw health advisor 10/2023 for medicare wellness visit. Note reviewed.   No results found.  Flowsheet Row Office Visit from 12/22/2023 in [facility] [place]  PHQ-2 Total Score 0       12/22/2023   11:04 AM 10/25/2023    1:09 PM 10/13/2023    9:02 AM 10/26/2022    9:47 AM 10/24/2022   11:06 AM  Fall Risk   Falls in the past year? 0 0 0 0 0  Number falls in past yr:  0   0  Injury with Fall?  0   0  Risk for fall due to :  No Fall Risks   No Fall Risks  Follow up  Education provided;Falls prevention discussed   Falls prevention discussed;Falls evaluation completed;Education provided   Chronic R leg and lower back pain. Saw physiatrists Dr Dorn Gaskins and Kirsteins s/p R TF ESI L4/5 09/2022 - no significant benefit. Lumbar MRI showed diffuse DDD with possible L L4 nerve root impingement with mod lumbar stenosis with spondylosis and facet arthropathy. Also with known R hip osteoarthritis s/p ortho eval 09/2021. Xrays at that time showed severe R hip osteoarthritis. He saw chiropractor with limited benefit - stopped gabapentin . Completed PT course (08/2022). Latest requested referral back to ortho for re evaluation. Has appt next Friday. Continues ambulating with cane.  Known PAD sees [facility] last seen 06/2023 - continue aspirin  and statin. Planned yearly follow up with rpt ABIs. No rest pain. Notes shin bone pain with walking. [facility] didn't feel R leg pain was due to arterial insufficiency.    Pruritic chronic skin rash - sees dermatology Dr  Alvia Awkward in [facility] - biopsy consistent with contact dermatitis. Dupixent injections didn't help, now on Ebglyss monthly injection with benefit. Also saw allergist - found to have allergies to mold, cheese, corn and grass varieties.   DM - continues metformin  XR 500mg  bid   Preventative: COLONOSCOPY 08/2018 - Diminutive adenoma, no f/u planned [name]) Prostate cancer screening - yearly PSA. No fmhx prostate cancer. Nocturia x3-4. Last checked 12/2022.  Lung cancer screening - ~25 PY hx. Quit ~2015, smoked late teens to 76s. Undergoing yearly lung cancer screening, latest 12/2022 - aged out.  Flu shot - yearly COVID vaccine Moderna 08/2019, 09/2019, booster 07/2020, latest 05/2022 Tdap - 2012 Prevnar-13 11/2013, pneumovax 2016, 10/2020 - s/p splenectomy will need Q5 yrs  Shingrix - 04/2021, 10/2021 Advanced directives - has this at home. Daughter to be HCPOA. Asked to bring us  copy for chart.  Seat belt use discussed.  Sunscreen use discussed. No changing moles on skin.  Ex smoker - quit ~2015. ~20 PY hx.  Alcohol - none  Dentist - Q6 months  Eye exam - yearly  Bowel - no constipation  Bladder - no incontinence    Lives with wife, no pets Occupation: retired, Education officer, environmental Edu: Degree Activity: housework, no  regular exercise  Diet: good water, fruits/vegetables daily      Relevant past medical, surgical, family and social history reviewed and updated as indicated. Interim medical history since our last visit reviewed. Allergies and medications reviewed and updated. Outpatient Medications Prior to Visit  Medication Sig Dispense Refill   Accu-Chek Softclix Lancets lancets Use as instructed to check blood sugar 2 times a day 200 each 3   albuterol  (VENTOLIN  HFA) 108 (90 Base) MCG/ACT inhaler INHALE 2 PUFFS BY MOUTH EVERY 6 HOURS AS NEEDED FOR WHEEZE OR SHORTNESS OF BREATH 8.5 each 3   aspirin  EC 81 MG tablet Take 81 mg by mouth every other day.     Blood Glucose Monitoring Suppl (ACCU-CHEK  GUIDE) w/Device KIT Use as directed to check sugars daily 1 kit 0   Cholecalciferol (VITAMIN D3) 25 MCG (1000 UT) CAPS Take 1 capsule (1,000 Units total) by mouth daily. 30 capsule    clobetasol  (TEMOVATE ) 0.05 % external solution APPLY THIN LAYER TO AFFECTED AREAS TWICE DAILY AS NEEDED , MIX WITH CERAVE.     EBGLYSS 250 MG/2ML SOAJ injection      glucose blood (ACCU-CHEK GUIDE) test strip 1 each by Other route 2 (two) times daily as needed for other. E11.65. Use as instructed 200 each 3   loratadine  (CLARITIN ) 10 MG tablet Take 1 tablet (10 mg total) by mouth daily.     metFORMIN  (GLUCOPHAGE -XR) 500 MG 24 hr tablet Take 1 tablet (500 mg total) by mouth 2 (two) times daily with a meal. 180 tablet 3   tiZANidine  (ZANAFLEX ) 4 MG tablet TAKE 1 TABLET BY MOUTH AT BEDTIME AS NEEDED FOR MUSCLE SPASMS. 90 tablet 0   triamcinolone  cream (KENALOG ) 0.1 % APPLY TO AFFECTED AREA TWICE A DAY 60 g 0   TURMERIC PO Take by mouth daily.     amLODipine  (NORVASC ) 10 MG tablet Take 1 tablet (10 mg total) by mouth daily. 90 tablet 4   atenolol  (TENORMIN ) 50 MG tablet Take 1 tablet (50 mg total) by mouth 2 (two) times daily. 180 tablet 4   diclofenac  (VOLTAREN ) 75 MG EC tablet Take 1 tablet (75 mg total) by mouth 2 (two) times daily. 60 tablet 3   omeprazole  (PRILOSEC) 40 MG capsule Take 1 capsule (40 mg total) by mouth daily. 90 capsule 4   rosuvastatin  (CRESTOR ) 5 MG tablet Take 1 tablet (5 mg total) by mouth daily. 90 tablet 4   sildenafil  (VIAGRA ) 100 MG tablet Take 1 tablet (100 mg total) by mouth daily as needed for erectile dysfunction. 5 tablet 11   sildenafil  (REVATIO ) 20 MG tablet TAKE 3-5 TABLETS BY MOUTH EACH DAY AS NEEDED (RELATIONS) 30 tablet 11   No facility-administered medications prior to visit.     Per HPI unless specifically indicated in ROS section below Review of Systems  Objective:  BP 134/64   Pulse 75   Temp 98 F (36.7 C) (Oral)   Ht 5' 10 (1.778 m)   Wt 184 lb 6 oz (83.6 kg)    SpO2 98%   BMI 26.46 kg/m   Wt Readings from Last 3 Encounters:  12/22/23 184 lb 6 oz (83.6 kg)  10/25/23 188 lb (85.3 kg)  10/13/23 188 lb (85.3 kg)      Physical Exam Vitals and nursing note reviewed.  Constitutional:      General: He is not in acute distress.    Appearance: Normal appearance. He is well-developed. He is not ill-appearing.  HENT:  Head: Normocephalic and atraumatic.     Right Ear: Hearing, tympanic membrane, ear canal and external ear normal.     Left Ear: Hearing, tympanic membrane, ear canal and external ear normal.     Mouth/Throat:     Mouth: Mucous membranes are moist.     Pharynx: Oropharynx is clear. No oropharyngeal exudate or posterior oropharyngeal erythema.   Eyes:     General: No scleral icterus.    Extraocular Movements: Extraocular movements intact.     Conjunctiva/sclera: Conjunctivae normal.     Pupils: Pupils are equal, round, and reactive to light.   Neck:     Thyroid : No thyroid  mass or thyromegaly.   Cardiovascular:     Rate and Rhythm: Normal rate and regular rhythm.     Pulses: Normal pulses.          Radial pulses are 2+ on the right side and 2+ on the left side.     Heart sounds: Normal heart sounds. No murmur heard. Pulmonary:     Effort: Pulmonary effort is normal. No respiratory distress.     Breath sounds: Normal breath sounds. No wheezing, rhonchi or rales.  Abdominal:     General: Bowel sounds are normal. There is no distension.     Palpations: Abdomen is soft. There is no mass.     Tenderness: There is no abdominal tenderness. There is no guarding or rebound.     Hernia: No hernia is present.   Musculoskeletal:        General: Normal range of motion.     Cervical back: Normal range of motion and neck supple.     Right lower leg: No edema.     Left lower leg: No edema.  Lymphadenopathy:     Cervical: No cervical adenopathy.   Skin:    General: Skin is warm and dry.     Findings: No rash.   Neurological:      General: No focal deficit present.     Mental Status: He is alert and oriented to person, place, and time.   Psychiatric:        Mood and Affect: Mood normal.        Behavior: Behavior normal.        Thought Content: Thought content normal.        Judgment: Judgment normal.       Results for orders placed or performed in visit on 12/15/23  Lipid panel   Collection Time: 12/15/23  7:50 AM  Result Value Ref Range   Cholesterol 144 <200 mg/dL   HDL 46 > OR = 40 mg/dL   Triglycerides 80 <914 mg/dL   LDL Cholesterol (Calc) 82 mg/dL (calc)   Total CHOL/HDL Ratio 3.1 <5.0 (calc)   Non-HDL Cholesterol (Calc) 98 <782 mg/dL (calc)  Comprehensive metabolic panel with GFR   Collection Time: 12/15/23  7:50 AM  Result Value Ref Range   Glucose, Bld 184 (H) 65 - 99 mg/dL   BUN 17 7 - 25 mg/dL   Creat 9.56 2.13 - 0.86 mg/dL   eGFR 69 > OR = 60 VH/QIO/9.62X5   BUN/Creatinine Ratio SEE NOTE: 6 - 22 (calc)   Sodium 138 135 - 146 mmol/L   Potassium 4.3 3.5 - 5.3 mmol/L   Chloride 101 98 - 110 mmol/L   CO2 25 20 - 32 mmol/L   Calcium  9.4 8.6 - 10.3 mg/dL   Total Protein 7.6 6.1 - 8.1 g/dL   Albumin 3.9 3.6 - 5.1  g/dL   Globulin 3.7 1.9 - 3.7 g/dL (calc)   AG Ratio 1.1 1.0 - 2.5 (calc)   Total Bilirubin 0.5 0.2 - 1.2 mg/dL   Alkaline phosphatase (APISO) 57 35 - 144 U/L   AST 13 10 - 35 U/L   ALT 8 (L) 9 - 46 U/L  Hemoglobin A1c   Collection Time: 12/15/23  7:50 AM  Result Value Ref Range   Hgb A1c MFr Bld 7.0 (H) <5.7 %   Mean Plasma Glucose 154 mg/dL   eAG (mmol/L) 8.5 mmol/L  Microalbumin / creatinine urine ratio   Collection Time: 12/15/23  7:50 AM  Result Value Ref Range   Creatinine, Urine 190 20 - 320 mg/dL   Microalb, Ur 0.3 mg/dL   Microalb Creat Ratio 2 <30 mg/g creat  TSH   Collection Time: 12/15/23  7:50 AM  Result Value Ref Range   TSH 2.03 0.40 - 4.50 mIU/L  T4, free   Collection Time: 12/15/23  7:50 AM  Result Value Ref Range   Free T4 1.3 0.8 - 1.8 ng/dL  T3    Collection Time: 12/15/23  7:50 AM  Result Value Ref Range   T3, Total 106 76 - 181 ng/dL  Vitamin B12   Collection Time: 12/15/23  7:50 AM  Result Value Ref Range   Vitamin B-12 683 200 - 1,100 pg/mL   Lab Results  Component Value Date   WBC 7.9 05/21/2022   HGB 15.4 05/21/2022   HCT 45.8 05/21/2022   MCV 92.7 05/21/2022   PLT 303 05/21/2022   Assessment & Plan:   Problem List Items Addressed This Visit     Advanced care planning/counseling discussion - Primary (Chronic)   Asked to bring us  copy.       Hypertension   Relevant Medications   amLODipine  (NORVASC ) 10 MG tablet   atenolol  (TENORMIN ) 50 MG tablet   rosuvastatin  (CRESTOR ) 5 MG tablet   sildenafil  (VIAGRA ) 100 MG tablet   GERD (gastroesophageal reflux disease)   Relevant Medications   omeprazole  (PRILOSEC) 40 MG capsule   Hyperlipidemia associated with type 2 diabetes mellitus (HCC)   Relevant Medications   amLODipine  (NORVASC ) 10 MG tablet   atenolol  (TENORMIN ) 50 MG tablet   rosuvastatin  (CRESTOR ) 5 MG tablet   sildenafil  (VIAGRA ) 100 MG tablet   Erectile dysfunction of organic origin   Relevant Medications   sildenafil  (VIAGRA ) 100 MG tablet   Osteoarthritis of right hip   Known severe R hip osteoarthritis by films 09/2021.  Will await ortho eval.  Has had PT courses of PT.  Has not had hip steroid injection.        Meds ordered this encounter  Medications   amLODipine  (NORVASC ) 10 MG tablet    Sig: Take 1 tablet (10 mg total) by mouth daily.    Dispense:  90 tablet    Refill:  3   atenolol  (TENORMIN ) 50 MG tablet    Sig: Take 1 tablet (50 mg total) by mouth 2 (two) times daily.    Dispense:  180 tablet    Refill:  3   omeprazole  (PRILOSEC) 40 MG capsule    Sig: Take 1 capsule (40 mg total) by mouth daily.    Dispense:  90 capsule    Refill:  3   rosuvastatin  (CRESTOR ) 5 MG tablet    Sig: Take 1 tablet (5 mg total) by mouth daily.    Dispense:  90 tablet    Refill:  3  sildenafil   (VIAGRA ) 100 MG tablet    Sig: Take 1 tablet (100 mg total) by mouth daily as needed for erectile dysfunction.    Dispense:  5 tablet    Refill:  11    Fill based on cost/affordability    No orders of the defined types were placed in this encounter.   Patient Instructions  Bring us  a copy of your living will.  Limit diclofenac  and other anti inflammatory medicines which are rough on the kidneys. Use omeprazole  only as needed for heartburn. Will await orthopedics evaluation.  Good to see you today Return as needed or in 6 months for diabetes follow up visit.   Follow up plan: Return in about 6 months (around 06/22/2024) for follow up visit.  Claire Crick, MD

## 2023-12-22 NOTE — Assessment & Plan Note (Signed)
Asked to bring us copy.  

## 2023-12-24 ENCOUNTER — Encounter: Payer: Self-pay | Admitting: Family Medicine

## 2023-12-24 ENCOUNTER — Other Ambulatory Visit: Payer: Self-pay | Admitting: Family Medicine

## 2023-12-24 DIAGNOSIS — I739 Peripheral vascular disease, unspecified: Secondary | ICD-10-CM | POA: Insufficient documentation

## 2023-12-24 NOTE — Assessment & Plan Note (Signed)
 Chronic, stable on current regimen - continue amlodipine , atenolol 

## 2023-12-24 NOTE — Assessment & Plan Note (Signed)
 Chronic, discussed limiting PPI.

## 2023-12-24 NOTE — Assessment & Plan Note (Signed)
 Followed by [facility], planned yearly ABIs.

## 2023-12-24 NOTE — Assessment & Plan Note (Signed)
 Refill viagra

## 2023-12-24 NOTE — Assessment & Plan Note (Addendum)
 Chronic, on low dose metformin  XR - continue. A1c down to 7%. RTC 6 mo DM f/u visit

## 2023-12-24 NOTE — Assessment & Plan Note (Addendum)
 Multifactorial - anticipate related to R lumbar radiculopathy, R hip osteoarthritis and R knee osteoarthritis.  H/o PAD, [facility] didn't think arterial insufficiency contributed.  Encourage limiting NSAID use

## 2023-12-24 NOTE — Assessment & Plan Note (Signed)
 Will offer prevnar-20 next visit,  Will need to eval immunization status of meningococcal vaccine

## 2023-12-24 NOTE — Assessment & Plan Note (Signed)
 Established with [facility] dermatology Dr Verdon - biopsy consistent with contact dermatitis, now treating with Ebglyss monthly injections

## 2023-12-24 NOTE — Assessment & Plan Note (Addendum)
Stable period with PRN albuterol.  

## 2023-12-24 NOTE — Assessment & Plan Note (Signed)
TFTs remain stable. 

## 2023-12-24 NOTE — Assessment & Plan Note (Addendum)
 Based on imaging.  Continue PRN colchicine .  Prednisone  previously caused severe hyperglycemia.

## 2023-12-24 NOTE — Assessment & Plan Note (Addendum)
 Chronic, continue  low dose rosuvastatin  with LDL 82, non-HDL 98.  H/o CAC by lung CT - goal LDL <100, ideally <70. Consider increased rosuvastatin  dose. The 10-year ASCVD risk score (Arnett DK, et al., 2019) is: 41.2%   Values used to calculate the score:     Age: 78 years     Clincally relevant sex: Male     Is Non-Hispanic African American: Yes     Diabetic: Yes     Tobacco smoker: No     Systolic Blood Pressure: 134 mmHg     Is BP treated: Yes     HDL Cholesterol: 46 mg/dL     Total Cholesterol: 144 mg/dL

## 2023-12-29 DIAGNOSIS — M1711 Unilateral primary osteoarthritis, right knee: Secondary | ICD-10-CM | POA: Diagnosis not present

## 2023-12-29 DIAGNOSIS — M5416 Radiculopathy, lumbar region: Secondary | ICD-10-CM | POA: Diagnosis not present

## 2024-01-12 DIAGNOSIS — M5416 Radiculopathy, lumbar region: Secondary | ICD-10-CM | POA: Diagnosis not present

## 2024-01-13 ENCOUNTER — Other Ambulatory Visit: Payer: Self-pay | Admitting: Family Medicine

## 2024-01-13 DIAGNOSIS — J449 Chronic obstructive pulmonary disease, unspecified: Secondary | ICD-10-CM

## 2024-03-19 ENCOUNTER — Ambulatory Visit: Payer: Self-pay

## 2024-03-19 ENCOUNTER — Ambulatory Visit (INDEPENDENT_AMBULATORY_CARE_PROVIDER_SITE_OTHER): Admitting: Family Medicine

## 2024-03-19 ENCOUNTER — Encounter: Payer: Self-pay | Admitting: Family Medicine

## 2024-03-19 VITALS — BP 138/78 | HR 66 | Temp 97.8°F | Ht 70.0 in | Wt 179.0 lb

## 2024-03-19 DIAGNOSIS — Z23 Encounter for immunization: Secondary | ICD-10-CM

## 2024-03-19 DIAGNOSIS — R21 Rash and other nonspecific skin eruption: Secondary | ICD-10-CM | POA: Diagnosis not present

## 2024-03-19 DIAGNOSIS — M25512 Pain in left shoulder: Secondary | ICD-10-CM | POA: Insufficient documentation

## 2024-03-19 DIAGNOSIS — G8929 Other chronic pain: Secondary | ICD-10-CM

## 2024-03-19 MED ORDER — TERBINAFINE HCL 1 % EX CREA: 1.0000 | TOPICAL_CREAM | Freq: Every day | CUTANEOUS | 0 refills | Status: AC

## 2024-03-19 MED ORDER — NAPROXEN 500 MG PO TABS
500.0000 mg | ORAL_TABLET | Freq: Two times a day (BID) | ORAL | 0 refills | Status: DC
Start: 2024-03-19 — End: 2024-04-26

## 2024-03-19 NOTE — Progress Notes (Signed)
 Ph: [phone] Fax: [phone]   Patient ID: Gregory Abbott, male    DOB: 28-Jan-1946, 78 y.o.   MRN: [id number]  This visit was conducted in person.  BP 138/78   Pulse 66   Temp 97.8 F (36.6 C) (Oral)   Ht 5' 10 (1.778 m)   Wt 179 lb (81.2 kg)   SpO2 96%   BMI 25.68 kg/m    CC: L shoulder pain  Subjective:   HPI: Gregory Abbott is a 78 y.o. male presenting on 03/19/2024 for Shoulder Pain (Pt C/o of Left shoulder pain. No injury. Merlene going on for months.)   2 wk h/o acute worsening of chronic (several months) L shoulder pain without inciting trauma/injury or falls. He had been doing some heavy lifting/yardwork and carpentry work when this pain worsened.  He sleeps on left side due to chronic R leg pain - this also helps left shoulder.  Worst pain is in the back of the shoulder. Pain described as dull ache, but sometimes acutely sharp. No shooting pain down arm, numbness/paresthesias.   H/o chronic midline neck pain as well.  Has been treating with intermittent tylenol  1000mg , ibuprofen 800mg , ice pack with benefit.   Diabetic on low dose metformin  XR  Lab Results  Component Value Date   HGBA1C 7.0 (H) 12/15/2023    Ongoing R leg pain, ongoing itching (see prior notes).      Relevant past medical, surgical, family and social history reviewed and updated as indicated. Interim medical history since our last visit reviewed. Allergies and medications reviewed and updated. Outpatient Medications Prior to Visit  Medication Sig Dispense Refill   Accu-Chek Softclix Lancets lancets Use as instructed to check blood sugar 2 times a day 200 each 3   albuterol  (VENTOLIN  HFA) 108 (90 Base) MCG/ACT inhaler INHALE 2 PUFFS BY MOUTH EVERY 6 HOURS AS NEEDED FOR WHEEZE OR SHORTNESS OF BREATH 8.5 each 3   amLODipine  (NORVASC ) 10 MG tablet Take 1 tablet (10 mg total) by mouth daily. 90 tablet 3   aspirin  EC 81 MG tablet Take 81 mg by mouth every other day.     atenolol  (TENORMIN )  50 MG tablet Take 1 tablet (50 mg total) by mouth 2 (two) times daily. 180 tablet 3   Blood Glucose Monitoring Suppl (ACCU-CHEK GUIDE) w/Device KIT Use as directed to check sugars daily 1 kit 0   Cholecalciferol (VITAMIN D3) 25 MCG (1000 UT) CAPS Take 1 capsule (1,000 Units total) by mouth daily. 30 capsule    clobetasol  (TEMOVATE ) 0.05 % external solution APPLY THIN LAYER TO AFFECTED AREAS TWICE DAILY AS NEEDED , MIX WITH CERAVE.     EBGLYSS 250 MG/2ML SOAJ injection      glucose blood (ACCU-CHEK GUIDE) test strip 1 each by Other route 2 (two) times daily as needed for other. E11.65. Use as instructed 200 each 3   loratadine  (CLARITIN ) 10 MG tablet Take 1 tablet (10 mg total) by mouth daily.     metFORMIN  (GLUCOPHAGE -XR) 500 MG 24 hr tablet Take 1 tablet (500 mg total) by mouth 2 (two) times daily with a meal. 180 tablet 3   omeprazole  (PRILOSEC) 40 MG capsule Take 1 capsule (40 mg total) by mouth daily. 90 capsule 3   rosuvastatin  (CRESTOR ) 5 MG tablet Take 1 tablet (5 mg total) by mouth daily. 90 tablet 3   sildenafil  (VIAGRA ) 100 MG tablet Take 1 tablet (100 mg total) by mouth daily as needed for erectile dysfunction. 5  tablet 11   tiZANidine  (ZANAFLEX ) 4 MG tablet TAKE 1 TABLET BY MOUTH AT BEDTIME AS NEEDED FOR MUSCLE SPASMS. 90 tablet 0   triamcinolone  cream (KENALOG ) 0.1 % APPLY TO AFFECTED AREA TWICE A DAY 60 g 0   TURMERIC PO Take by mouth daily.     No facility-administered medications prior to visit.     Per HPI unless specifically indicated in ROS section below Review of Systems  Objective:  BP 138/78   Pulse 66   Temp 97.8 F (36.6 C) (Oral)   Ht 5' 10 (1.778 m)   Wt 179 lb (81.2 kg)   SpO2 96%   BMI 25.68 kg/m   Wt Readings from Last 3 Encounters:  03/19/24 179 lb (81.2 kg)  12/22/23 184 lb 6 oz (83.6 kg)  10/25/23 188 lb (85.3 kg)      Physical Exam Vitals and nursing note reviewed.  Constitutional:      Appearance: Normal appearance. He is not ill-appearing.   Neck:     Comments:  Limited ROM cervical neck in lateral rotation and lateral flexion R>L Musculoskeletal:        General: Tenderness present. Normal range of motion.     Cervical back: Normal range of motion and neck supple. No tenderness.     Comments:  R shoulder WNL L shoulder exam: No deformity of shoulders on inspection. No significant pain with palpation of shoulder landmarks. Limited ROM in abduction and forward flexion past 90 degrees due to pain /stiffness. Mild discomfort /weakness with testing SITS in ext rotation. ++ pain with empty can sign. Neg Speed test. Discomfort with impingement testing. Discomfort with rotation of humeral head in [facility] joint.   Skin:    Findings: Rash present.     Comments: Hyperpigmented pruritic macular rash through groin, involving scrotum   Neurological:     Mental Status: He is alert.  Psychiatric:        Mood and Affect: Mood normal.        Behavior: Behavior normal.       Lab Results  Component Value Date   NA 138 12/15/2023   CL 101 12/15/2023   K 4.3 12/15/2023   CO2 25 12/15/2023   BUN 17 12/15/2023   CREATININE 1.10 12/15/2023   EGFR 69 12/15/2023   CALCIUM  9.4 12/15/2023   PHOS 3.1 06/22/2022   ALBUMIN 4.0 12/16/2022   GLUCOSE 184 (H) 12/15/2023    Assessment & Plan:   Problem List Items Addressed This Visit     Left shoulder pain - Primary   Acute on chronic left shoulder pain with exam suspicious for supraspinatous tendonitis/partial tear and developing adhesive capsulitis.  Avoid oral steroids in diabetic.  Rx naprosyn  500mg  bid with meals x1 wk then PRN pain.  Provided with exercises from [facility] pt advisor on frozen shoulder and rotator cuff injury. If not improving over next 1-2 weeks, rec f/u with sports med for further evaluation/to consider steroid injection.       Groin rash   Suspect tinea cruris however as scrotum involved, may be candidal intertrigo.  He's been using steroid creams as well as multiple  topical OTC remedies including antifungals per his report.  Rec against topical steroid creams.  Will recommend daily lamisil  for 1-2 wks, update with effect. Low threshold to prescribe nystatin cream if poor response to above.        Other Visit Diagnoses       Encounter for immunization  Relevant Orders   Flu vaccine HIGH DOSE PF(Fluzone Trivalent) (Completed)        Meds ordered this encounter  Medications   naproxen  (NAPROSYN ) 500 MG tablet    Sig: Take 1 tablet (500 mg total) by mouth 2 (two) times daily with a meal. Take for 1 week then as needed    Dispense:  30 tablet    Refill:  0   terbinafine  (LAMISIL  AT) 1 % cream    Sig: Apply 1 Application topically daily.    Dispense:  42 g    Refill:  0    Orders Placed This Encounter  Procedures   Flu vaccine HIGH DOSE PF(Fluzone Trivalent)    Patient Instructions  Flu shot today  I think you may have rotator cuff injury as well as developing frozen shoulder of left shoulder.  Try exercises provided today.  Start naprosyn  500mg  twice a day with meals for 1 week, then as needed. Don't take ibuprofen with this. Ok to use tylenol  as needed as well. Heating pad to shoulder.  Schedule sports medicine follow up if not improved with this.   Don't use steroid cream to groin rash - jock itch.  Use over the counter lotrimin  (clotrimazole ) or lamisil  (terbinafine ) antifungal (sent to [facility]).   Follow up plan: Return if symptoms worsen or fail to improve.  Anton Blas, MD

## 2024-03-19 NOTE — Assessment & Plan Note (Signed)
 Acute on chronic left shoulder pain with exam suspicious for supraspinatous tendonitis/partial tear and developing adhesive capsulitis.  Avoid oral steroids in diabetic.  Rx naprosyn  500mg  bid with meals x1 wk then PRN pain.  Provided with exercises from [facility] pt advisor on frozen shoulder and rotator cuff injury. If not improving over next 1-2 weeks, rec f/u with sports med for further evaluation/to consider steroid injection.

## 2024-03-19 NOTE — Telephone Encounter (Signed)
 FYI Only or Action Required?: FYI only for provider.  Patient was last seen in primary care on 12/22/2023 by Rilla Baller, MD.  Called Nurse Triage reporting Arm Pain.  Symptoms began several weeks ago.  Interventions attempted: OTC medications: Aspercream and Tylenol  and Rest, hydration, or home remedies.  Symptoms are: gradually worsening.  Triage Disposition: See PCP Within 2 Weeks  Patient/caregiver understands and will follow disposition?: Yes  Copied from CRM [id number]. Topic: Clinical - Red Word Triage >> Mar 19, 2024  8:15 AM Robinson H wrote: [facility] that prompted transfer to Nurse Triage: Pain in left arm, sometimes hard to lift. Feels like it's getting worse Reason for Disposition  Arm pain is a chronic symptom (recurrent or ongoing AND present > 4 weeks)  Answer Assessment - Initial Assessment Questions 1. ONSET: When did the pain start?     A couple of months, has been going on since the summer, but getting worse over the last 2 week  2. LOCATION: Where is the pain located?     Left Arm, shoulder area  3. PAIN: How bad is the pain? (Scale 0-10; or none, mild, moderate, severe)     2/10 when sitting still, but when trying to lift the pain worsens  4. WORK OR EXERCISE: Has there been any recent work or exercise that involved this part of the body?     Has been doing some heavy lifting, yard work  5. CAUSE: What do you think is causing the arm pain?     Concerned that it may be bursitis or arthritis  6. OTHER SYMPTOMS: Do you have any other symptoms? (e.g., neck pain, swelling, rash, fever, numbness, weakness)     No  Protocols used: Arm Pain-A-AH

## 2024-03-19 NOTE — Patient Instructions (Addendum)
 Flu shot today  I think you may have rotator cuff injury as well as developing frozen shoulder of left shoulder.  Try exercises provided today.  Start naprosyn  500mg  twice a day with meals for 1 week, then as needed. Don't take ibuprofen with this. Ok to use tylenol  as needed as well. Heating pad to shoulder.  Schedule sports medicine follow up if not improved with this.   Don't use steroid cream to groin rash - jock itch.  Use over the counter lotrimin  (clotrimazole ) or lamisil  (terbinafine ) antifungal (sent to [facility]).

## 2024-03-19 NOTE — Assessment & Plan Note (Addendum)
 Suspect tinea cruris however as scrotum involved, may be candidal intertrigo.  He's been using steroid creams as well as multiple topical OTC remedies including antifungals per his report.  Rec against topical steroid creams.  Will recommend daily lamisil  for 1-2 wks, update with effect. Low threshold to prescribe nystatin cream if poor response to above.

## 2024-03-20 DIAGNOSIS — Z23 Encounter for immunization: Secondary | ICD-10-CM | POA: Diagnosis not present

## 2024-03-28 DIAGNOSIS — L2089 Other atopic dermatitis: Secondary | ICD-10-CM | POA: Diagnosis not present

## 2024-04-26 ENCOUNTER — Other Ambulatory Visit: Payer: Self-pay | Admitting: Family Medicine

## 2024-05-13 DIAGNOSIS — L2089 Other atopic dermatitis: Secondary | ICD-10-CM | POA: Diagnosis not present

## 2024-06-07 DIAGNOSIS — L2089 Other atopic dermatitis: Secondary | ICD-10-CM | POA: Diagnosis not present

## 2024-06-24 ENCOUNTER — Ambulatory Visit: Admitting: Family Medicine

## 2024-06-24 ENCOUNTER — Encounter: Payer: Self-pay | Admitting: Family Medicine

## 2024-06-24 VITALS — BP 126/74 | HR 72 | Temp 98.0°F | Ht 70.0 in | Wt 186.0 lb

## 2024-06-24 DIAGNOSIS — R0989 Other specified symptoms and signs involving the circulatory and respiratory systems: Secondary | ICD-10-CM

## 2024-06-24 DIAGNOSIS — Z9081 Acquired absence of spleen: Secondary | ICD-10-CM | POA: Diagnosis not present

## 2024-06-24 DIAGNOSIS — G8929 Other chronic pain: Secondary | ICD-10-CM | POA: Diagnosis not present

## 2024-06-24 DIAGNOSIS — I10 Essential (primary) hypertension: Secondary | ICD-10-CM | POA: Diagnosis not present

## 2024-06-24 DIAGNOSIS — E1169 Type 2 diabetes mellitus with other specified complication: Secondary | ICD-10-CM | POA: Diagnosis not present

## 2024-06-24 DIAGNOSIS — M5416 Radiculopathy, lumbar region: Secondary | ICD-10-CM

## 2024-06-24 DIAGNOSIS — L2989 Other pruritus: Secondary | ICD-10-CM | POA: Diagnosis not present

## 2024-06-24 DIAGNOSIS — M79604 Pain in right leg: Secondary | ICD-10-CM | POA: Diagnosis not present

## 2024-06-24 LAB — POCT GLYCOSYLATED HEMOGLOBIN (HGB A1C): Hemoglobin A1C: 7 % — AB (ref 4.0–5.6)

## 2024-06-24 MED ORDER — NAPROXEN 500 MG PO TABS: 500.0000 mg | ORAL_TABLET | Freq: Two times a day (BID) | ORAL | 0 refills | Status: AC

## 2024-06-24 NOTE — Patient Instructions (Addendum)
 Continue metformin  and other medicines.  Naproxen  refilled.  Consider trial of alpha lipoic acid 600mg  once daily supplement.  Return in 6 months for medicare wellness visit.

## 2024-06-24 NOTE — Assessment & Plan Note (Signed)
 Appreciate [facility] derm care - treating severe atopic dermatitis with new monthly injection.

## 2024-06-24 NOTE — Assessment & Plan Note (Signed)
 Due for [facility] f/u - thought pain due to ortho not vascular issue

## 2024-06-24 NOTE — Assessment & Plan Note (Signed)
 Reviewed immunization recommendations.  Consider prevnar in 2027, consider updated meningitis shot/HiB shot.

## 2024-06-24 NOTE — Assessment & Plan Note (Signed)
 Chronic ,great control, continue current regimen.

## 2024-06-24 NOTE — Assessment & Plan Note (Signed)
Handicap placard filled out. 

## 2024-06-24 NOTE — Progress Notes (Signed)
 " Ph: [phone] Fax: [phone]   Patient ID: Gregory Abbott, male    DOB: Aug 05, 1945, 78 y.o.   MRN: [id number]  This visit was conducted in person.  BP 126/74 (Cuff Size: Normal)   Pulse 72   Temp 98 F (36.7 C) (Oral)   Ht 5' 10 (1.778 m)   Wt 186 lb (84.4 kg)   SpO2 96%   BMI 26.69 kg/m    CC: 6 mo f/u visit  Subjective:   HPI: Gregory Abbott is a 78 y.o. male presenting on 06/24/2024 for Medical Management of Chronic Issues (Advanced care planning/counseling discussion)   S/p partial splenectomy - s/p pneumovax23 10/2020-  will recommend updated prevnar-20 in 2027. He is unsure if vaccinated to meningococcal or HiB.   Requests naprosyn  refilled. Requests handicap placard refilled.   Known PAD - last seen 06/2023. 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   DM - does not regularly check sugars - last week 167 nonfasting, 102 fasting. Compliant with antihyperglycemic regimen which includes: metformin  XR 500mg  bid. Denies low sugars or hypoglycemic symptoms. Denies paresthesias, blurry vision. Last diabetic eye exam 10/2023. Glucometer brand: accuchek. Last foot exam: 06/2023 - DUE. DSME: declined.  Lab Results  Component Value Date   HGBA1C 7.0 (A) 06/24/2024   Diabetic Foot Exam - Simple   Simple Foot Form Diabetic Foot exam was performed with the following findings: Yes 06/24/2024  8:27 AM  Visual Inspection No deformities, no ulcerations, no other skin breakdown bilaterally: Yes Sensation Testing Intact to touch and monofilament testing bilaterally: Yes Pulse Check See comments: Yes Comments No claudication Mildly diminished pedal pulses bilaterally  Dry skin to feet  Mild swelling noted to ankles and feet     Lab Results  Component Value Date   MICROALBUR 0.3 12/15/2023     Chronic R leg and lower back pain. Saw physiatrists Dr Emeline and Kirsteins s/p R TF ESI L4/5 09/2022 - no  significant benefit. Lumbar MRI showed diffuse DDD with possible L L4 nerve root impingement with mod lumbar stenosis with spondylosis and facet arthropathy. Also with known R hip osteoarthritis s/p ortho eval 09/2021. Xrays at that time showed severe R hip osteoarthritis. He saw chiropractor with limited benefit - stopped gabapentin . Completed PT course (08/2022). Latest requested referral back to ortho for re evaluation. Has appt next Friday. Continues ambulating with cane.  Chronic pruritic rash - established with [facility] dermatology Dr Verdon - biopsy consistent with contact dermatitis, now treating atopic dermatitis with Ebglyss  --> Nemluvio monthly injections. Also saw allergist - found to have allergies to mold, cheese, corn and grass varieties.      Relevant past medical, surgical, family and social history reviewed and updated as indicated. Interim medical history since our last visit reviewed. Allergies and medications reviewed and updated. Outpatient Medications Prior to Visit  Medication Sig Dispense Refill   Accu-Chek Softclix Lancets lancets Use as instructed to check blood sugar 2 times a day 200 each 3   albuterol  (VENTOLIN  HFA) 108 (90 Base) MCG/ACT inhaler INHALE 2 PUFFS BY MOUTH EVERY 6 HOURS AS NEEDED FOR WHEEZE OR SHORTNESS OF BREATH 8.5 each 3   amLODipine  (NORVASC ) 10 MG tablet Take 1 tablet (10 mg total) by mouth daily. 90 tablet 3   aspirin  EC 81 MG tablet Take 81 mg by mouth every other day.     atenolol  (TENORMIN ) 50 MG tablet Take 1 tablet (50 mg total)  by mouth 2 (two) times daily. 180 tablet 3   Blood Glucose Monitoring Suppl (ACCU-CHEK GUIDE) w/Device KIT Use as directed to check sugars daily 1 kit 0   Cholecalciferol (VITAMIN D3) 25 MCG (1000 UT) CAPS Take 1 capsule (1,000 Units total) by mouth daily. (Patient taking differently: Take 1 capsule by mouth as needed.) 30 capsule    clobetasol  (TEMOVATE ) 0.05 % external solution APPLY THIN LAYER TO AFFECTED AREAS TWICE DAILY  AS NEEDED , MIX WITH CERAVE.     glucose blood (ACCU-CHEK GUIDE) test strip 1 each by Other route 2 (two) times daily as needed for other. E11.65. Use as instructed 200 each 3   loratadine  (CLARITIN ) 10 MG tablet Take 1 tablet (10 mg total) by mouth daily. (Patient taking differently: Take 10 mg by mouth daily as needed.)     metFORMIN  (GLUCOPHAGE -XR) 500 MG 24 hr tablet Take 1 tablet (500 mg total) by mouth 2 (two) times daily with a meal. 180 tablet 3   NEMLUVIO 30 MG SQ injection Inject 20 mg into the skin every 30 (thirty) days.     omeprazole  (PRILOSEC) 40 MG capsule Take 1 capsule (40 mg total) by mouth daily. 90 capsule 3   rosuvastatin  (CRESTOR ) 5 MG tablet Take 1 tablet (5 mg total) by mouth daily. 90 tablet 3   sildenafil  (VIAGRA ) 100 MG tablet Take 1 tablet (100 mg total) by mouth daily as needed for erectile dysfunction. 5 tablet 11   tacrolimus (PROTOPIC) 0.1 % ointment Apply 1 Application topically as needed.     terbinafine  (LAMISIL  AT) 1 % cream Apply 1 Application topically daily. 42 g 0   tiZANidine  (ZANAFLEX ) 4 MG tablet TAKE 1 TABLET BY MOUTH AT BEDTIME AS NEEDED FOR MUSCLE SPASMS. 90 tablet 0   triamcinolone  cream (KENALOG ) 0.1 % APPLY TO AFFECTED AREA TWICE A DAY 60 g 0   EBGLYSS 250 MG/2ML SOAJ injection      naproxen  (NAPROSYN ) 500 MG tablet TAKE 1 TABLET (500 MG TOTAL) BY MOUTH 2 (TWO) TIMES DAILY WITH A MEAL. TAKE FOR 1 WEEK THEN AS NEEDED 30 tablet 0   TURMERIC PO Take by mouth daily.     No facility-administered medications prior to visit.     Per HPI unless specifically indicated in ROS section below Review of Systems  Objective:  BP 126/74 (Cuff Size: Normal)   Pulse 72   Temp 98 F (36.7 C) (Oral)   Ht 5' 10 (1.778 m)   Wt 186 lb (84.4 kg)   SpO2 96%   BMI 26.69 kg/m   Wt Readings from Last 3 Encounters:  06/24/24 186 lb (84.4 kg)  03/19/24 179 lb (81.2 kg)  12/22/23 184 lb 6 oz (83.6 kg)      Physical Exam Vitals and nursing note reviewed.   Constitutional:      Appearance: Normal appearance. He is not ill-appearing.  HENT:     Head: Normocephalic and atraumatic.     Mouth/Throat:     Mouth: Mucous membranes are moist.     Pharynx: Oropharynx is clear. No oropharyngeal exudate or posterior oropharyngeal erythema.  Eyes:     Extraocular Movements: Extraocular movements intact.     Conjunctiva/sclera: Conjunctivae normal.     Pupils: Pupils are equal, round, and reactive to light.  Cardiovascular:     Rate and Rhythm: Normal rate and regular rhythm.     Pulses: Normal pulses.     Heart sounds: Normal heart sounds. No murmur heard. Pulmonary:  Effort: Pulmonary effort is normal. No respiratory distress.     Breath sounds: Normal breath sounds. No wheezing, rhonchi or rales.  Musculoskeletal:     Right lower leg: No edema.     Left lower leg: No edema.     Comments: See HPI for foot exam if done  Skin:    General: Skin is warm and dry.     Findings: No rash.  Neurological:     Mental Status: He is alert.  Psychiatric:        Mood and Affect: Mood normal.        Behavior: Behavior normal.       Lab Results  Component Value Date   NA 138 12/15/2023   CL 101 12/15/2023   K 4.3 12/15/2023   CO2 25 12/15/2023   BUN 17 12/15/2023   CREATININE 1.10 12/15/2023   EGFR 69 12/15/2023   CALCIUM  9.4 12/15/2023   PHOS 3.1 06/22/2022   ALBUMIN 4.0 12/16/2022   GLUCOSE 184 (H) 12/15/2023   Lab Results  Component Value Date   VITAMINB12 683 12/15/2023   Assessment & Plan:   Problem List Items Addressed This Visit     Hypertension   Chronic ,great control, continue current regimen.       Chronic pruritic rash in adult   Appreciate [facility] derm care - treating severe atopic dermatitis with new monthly injection.       Type 2 diabetes mellitus with other specified complication (HCC) - Primary   Chronic, stable. Continue current regimen.       Relevant Orders   HgB A1c (Completed)   Chronic leg pain    Handicap placard filled out.       Relevant Medications   naproxen  (NAPROSYN ) 500 MG tablet   Diminished pulses in lower extremity   Due for [facility] f/u - thought pain due to ortho not vascular issue       History of partial splenectomy   Reviewed immunization recommendations.  Consider prevnar in 2027, consider updated meningitis shot/HiB shot.       Chronic right-sided lumbar radiculopathy   Handicap placard filled out.         Meds ordered this encounter  Medications   naproxen  (NAPROSYN ) 500 MG tablet    Sig: Take 1 tablet (500 mg total) by mouth 2 (two) times daily with a meal. Take for 1 week then as needed    Dispense:  30 tablet    Refill:  0    Orders Placed This Encounter  Procedures   HgB A1c    Patient Instructions  Continue metformin  and other medicines.  Naproxen  refilled.  Consider trial of alpha lipoic acid 600mg  once daily supplement.  Return in 6 months for medicare wellness visit.   Follow up plan: Return in about 6 months (around 12/23/2024) for medicare wellness visit.  Anton Blas, MD   "

## 2024-06-24 NOTE — Assessment & Plan Note (Signed)
 Chronic, stable. Continue current regimen.

## 2024-10-25 ENCOUNTER — Ambulatory Visit

## 2024-12-17 ENCOUNTER — Other Ambulatory Visit

## 2024-12-24 ENCOUNTER — Encounter: Admitting: Family Medicine
